# Patient Record
Sex: Male | Born: 1955 | Race: Black or African American | Hispanic: No | Marital: Married | Smoking: Current every day smoker
Health system: Southern US, Community
[De-identification: ages and names within clinical notes are randomized; demographics above are authoritative.]

## PROBLEM LIST (undated history)

## (undated) DIAGNOSIS — E7800 Pure hypercholesterolemia, unspecified: Secondary | ICD-10-CM

## (undated) DIAGNOSIS — Z720 Tobacco use: Secondary | ICD-10-CM

## (undated) DIAGNOSIS — K449 Diaphragmatic hernia without obstruction or gangrene: Secondary | ICD-10-CM

## (undated) DIAGNOSIS — J439 Emphysema, unspecified: Secondary | ICD-10-CM

## (undated) DIAGNOSIS — D869 Sarcoidosis, unspecified: Secondary | ICD-10-CM

## (undated) DIAGNOSIS — E119 Type 2 diabetes mellitus without complications: Secondary | ICD-10-CM

## (undated) DIAGNOSIS — R079 Chest pain, unspecified: Secondary | ICD-10-CM

## (undated) DIAGNOSIS — K635 Polyp of colon: Secondary | ICD-10-CM

## (undated) DIAGNOSIS — IMO0002 Reserved for concepts with insufficient information to code with codable children: Secondary | ICD-10-CM

## (undated) DIAGNOSIS — I10 Essential (primary) hypertension: Secondary | ICD-10-CM

## (undated) DIAGNOSIS — B1920 Unspecified viral hepatitis C without hepatic coma: Secondary | ICD-10-CM

## (undated) DIAGNOSIS — F1290 Cannabis use, unspecified, uncomplicated: Secondary | ICD-10-CM

## (undated) DIAGNOSIS — M1990 Unspecified osteoarthritis, unspecified site: Secondary | ICD-10-CM

## (undated) DIAGNOSIS — D696 Thrombocytopenia, unspecified: Secondary | ICD-10-CM

## (undated) HISTORY — DX: Essential (primary) hypertension: I10

## (undated) HISTORY — PX: APPENDECTOMY: SHX54

## (undated) HISTORY — DX: Polyp of colon: K63.5

## (undated) HISTORY — DX: Emphysema, unspecified: J43.9

## (undated) HISTORY — DX: Unspecified viral hepatitis C without hepatic coma: B19.20

## (undated) HISTORY — DX: Unspecified osteoarthritis, unspecified site: M19.90

## (undated) HISTORY — DX: Thrombocytopenia, unspecified: D69.6

## (undated) HISTORY — DX: Sarcoidosis, unspecified: D86.9

## (undated) HISTORY — DX: Reserved for concepts with insufficient information to code with codable children: IMO0002

---
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
## 1998-04-10 ENCOUNTER — Ambulatory Visit (HOSPITAL_COMMUNITY): Admission: RE | Admit: 1998-04-10 | Discharge: 1998-04-10 | Payer: Self-pay

## 1998-04-30 ENCOUNTER — Encounter: Admission: RE | Admit: 1998-04-30 | Discharge: 1998-04-30 | Payer: Self-pay | Admitting: *Deleted

## 1998-09-02 ENCOUNTER — Encounter: Payer: Self-pay | Admitting: General Surgery

## 1998-09-02 ENCOUNTER — Encounter: Payer: Self-pay | Admitting: Emergency Medicine

## 1998-09-02 ENCOUNTER — Inpatient Hospital Stay (HOSPITAL_COMMUNITY): Admission: EM | Admit: 1998-09-02 | Discharge: 1998-09-04 | Payer: Self-pay | Admitting: Emergency Medicine

## 1998-12-12 ENCOUNTER — Ambulatory Visit (HOSPITAL_COMMUNITY): Admission: RE | Admit: 1998-12-12 | Discharge: 1998-12-12 | Payer: Self-pay | Admitting: Family Medicine

## 1998-12-12 ENCOUNTER — Encounter: Payer: Self-pay | Admitting: Family Medicine

## 2000-01-19 ENCOUNTER — Encounter (INDEPENDENT_AMBULATORY_CARE_PROVIDER_SITE_OTHER): Payer: Self-pay | Admitting: Specialist

## 2000-01-19 ENCOUNTER — Encounter: Payer: Self-pay | Admitting: Gastroenterology

## 2000-01-19 ENCOUNTER — Ambulatory Visit (HOSPITAL_COMMUNITY): Admission: RE | Admit: 2000-01-19 | Discharge: 2000-01-19 | Payer: Self-pay | Admitting: Gastroenterology

## 2004-01-07 ENCOUNTER — Ambulatory Visit (HOSPITAL_COMMUNITY): Admission: RE | Admit: 2004-01-07 | Discharge: 2004-01-07 | Payer: Self-pay | Admitting: Internal Medicine

## 2004-04-02 ENCOUNTER — Ambulatory Visit: Payer: Self-pay | Admitting: Gastroenterology

## 2005-01-29 ENCOUNTER — Ambulatory Visit: Payer: Self-pay | Admitting: Gastroenterology

## 2005-02-24 ENCOUNTER — Ambulatory Visit (HOSPITAL_COMMUNITY): Admission: RE | Admit: 2005-02-24 | Discharge: 2005-02-24 | Payer: Self-pay | Admitting: Gastroenterology

## 2005-02-24 ENCOUNTER — Encounter (INDEPENDENT_AMBULATORY_CARE_PROVIDER_SITE_OTHER): Payer: Self-pay | Admitting: Specialist

## 2007-06-09 ENCOUNTER — Ambulatory Visit: Payer: Self-pay | Admitting: Pulmonary Disease

## 2007-06-09 DIAGNOSIS — R0602 Shortness of breath: Secondary | ICD-10-CM | POA: Insufficient documentation

## 2007-06-09 DIAGNOSIS — D869 Sarcoidosis, unspecified: Secondary | ICD-10-CM

## 2007-06-09 DIAGNOSIS — R05 Cough: Secondary | ICD-10-CM | POA: Insufficient documentation

## 2007-06-09 DIAGNOSIS — E1169 Type 2 diabetes mellitus with other specified complication: Secondary | ICD-10-CM | POA: Insufficient documentation

## 2007-06-09 DIAGNOSIS — E785 Hyperlipidemia, unspecified: Secondary | ICD-10-CM

## 2007-06-14 ENCOUNTER — Ambulatory Visit: Payer: Self-pay | Admitting: Pulmonary Disease

## 2007-06-27 ENCOUNTER — Ambulatory Visit: Payer: Self-pay | Admitting: Pulmonary Disease

## 2007-08-28 ENCOUNTER — Ambulatory Visit (HOSPITAL_COMMUNITY): Admission: RE | Admit: 2007-08-28 | Discharge: 2007-08-28 | Payer: Self-pay | Admitting: Internal Medicine

## 2007-11-09 ENCOUNTER — Ambulatory Visit: Payer: Self-pay | Admitting: Pulmonary Disease

## 2008-03-29 LAB — HM COLONOSCOPY

## 2008-06-26 ENCOUNTER — Ambulatory Visit (HOSPITAL_COMMUNITY): Admission: RE | Admit: 2008-06-26 | Discharge: 2008-06-26 | Payer: Self-pay | Admitting: Internal Medicine

## 2008-09-09 ENCOUNTER — Ambulatory Visit (HOSPITAL_COMMUNITY): Admission: RE | Admit: 2008-09-09 | Discharge: 2008-09-09 | Payer: Self-pay | Admitting: Internal Medicine

## 2008-09-26 ENCOUNTER — Ambulatory Visit: Payer: Self-pay | Admitting: Gastroenterology

## 2008-10-11 ENCOUNTER — Encounter: Payer: Self-pay | Admitting: Gastroenterology

## 2008-10-11 ENCOUNTER — Ambulatory Visit: Payer: Self-pay | Admitting: Gastroenterology

## 2008-10-15 ENCOUNTER — Encounter: Payer: Self-pay | Admitting: Gastroenterology

## 2009-07-25 ENCOUNTER — Emergency Department (HOSPITAL_COMMUNITY): Admission: EM | Admit: 2009-07-25 | Discharge: 2009-07-25 | Payer: Self-pay | Admitting: Emergency Medicine

## 2009-07-25 ENCOUNTER — Ambulatory Visit: Payer: Self-pay | Admitting: Cardiology

## 2009-08-01 ENCOUNTER — Encounter: Payer: Self-pay | Admitting: Cardiology

## 2009-08-01 DIAGNOSIS — E663 Overweight: Secondary | ICD-10-CM

## 2009-08-04 ENCOUNTER — Encounter: Payer: Self-pay | Admitting: Cardiology

## 2009-08-04 ENCOUNTER — Ambulatory Visit: Payer: Self-pay

## 2009-08-04 ENCOUNTER — Ambulatory Visit (HOSPITAL_COMMUNITY): Admission: RE | Admit: 2009-08-04 | Discharge: 2009-08-04 | Payer: Self-pay | Admitting: Cardiology

## 2009-08-04 ENCOUNTER — Ambulatory Visit: Payer: Self-pay | Admitting: Cardiology

## 2010-03-13 ENCOUNTER — Encounter
Admission: RE | Admit: 2010-03-13 | Discharge: 2010-03-13 | Payer: Self-pay | Source: Home / Self Care | Attending: Internal Medicine | Admitting: Internal Medicine

## 2010-04-30 NOTE — Assessment & Plan Note (Signed)
Summary: EPH/JML   Visit Type:  post emergency room visi Primary Provider:  Marisue Brooklyn  CC:  shoulder pain.  History of Present Illness: The patient had been seen in the emergency room for the evaluation of left chest and left shoulder and neck pain.  This was done on July 25, 2009.  There was no evidence of an acute coronary syndrome and plans were made for the patient to have completion of his workup as an outpatient.  Today he underwent a stress echo.  I have reviewed the treadmill and the images myself and they are normal.  Patient continues to have some mild discomfort in his left shoulder and neck.  This continues to be intermittent.  It is not exertional.  Current Medications (verified): 1)  Zantac 150 Mg  Caps (Ranitidine Hcl) .... Take 1 Tablet By Mouth Once A Day As Needed 2)  Fenofibrate Micronized 134 Mg Caps (Fenofibrate Micronized) .... Once Daily  Allergies (verified): No Known Drug Allergies  Past History:  Past Medical History: OVERWEIGHT/OBESITY (ICD-278.02) CHEST PAIN  ER consultation... no MI... discharged home for outpatient followup   /   stress echo  Aug 04, 2009 normal DYSPNEA (ICD-786.05) COUGH (ICD-786.2) HYPERLIPIDEMIA (ICD-272.4) SARCOIDOSIS (ICD-135) HIATAL HERNIA (ICD-553.3) hepatitis C    Review of Systems       Patient denies fever, chills, headache, sweats, rash, change in vision, change in hearing, chest pain, cough, nausea vomiting, urinary symptoms.  All of the systems are reviewed and are negative.  Vital Signs:  Patient profile:   55 year old male Height:      60 inches Weight:      227 pounds BMI:     44.49 Pulse rate:   86 / minute BP sitting:   112 / 72  (left arm) Cuff size:   regular  Vitals Entered By: Hardin Negus, RMA (Aug 04, 2009 11:44 AM)  Physical Exam  General:  patient is stable. Eyes:  no xanthelasma. Neck:  no jugulovenous stent. Lungs:  lungs are clear her respiratory effort is nonlabored. Heart:   cardiac exam reveals S1 and S2.  No clicks or significant murmurs. Abdomen:  abdomen soft. Extremities:  no peripheral edema. Psych:  patient is oriented to person time and place.  Affect is normal.   Impression & Recommendations:  Problem # 1:  OVERWEIGHT/OBESITY (ICD-278.02) Patient needs to lose weight.  Problem # 2:  CHEST PAIN-UNSPECIFIED (ICD-786.50)  Orders: EKG w/ Interpretation (93000) Standard EKG is done today and reviewed by me.  It is normal.  I've also review the stress echo.  There is normal LV function and no sign of scar or ischemia.  No further workup is needed.  Problem # 3:  DYSPNEA (ICD-786.05) Patient is not having any shortness of breath.  No further workup

## 2010-04-30 NOTE — Miscellaneous (Signed)
  Clinical Lists Changes  Observations: Added new observation of PAST MED HX: OVERWEIGHT/OBESITY (ICD-278.02) CHEST PAIN  emergency room consultation done... July 25, 2009... no enzyme elevation... patient stable to go home for outpatient followup DYSPNEA (ICD-786.05) COUGH (ICD-786.2) HYPERLIPIDEMIA (ICD-272.4) SARCOIDOSIS (ICD-135) HIATAL HERNIA (ICD-553.3) hepatitis C   (08/01/2009 12:54) Added new observation of PRIMARY MD: Marisue Brooklyn (08/01/2009 12:54)       Past History:  Past Medical History: OVERWEIGHT/OBESITY (ICD-278.02) CHEST PAIN  emergency room consultation done... July 25, 2009... no enzyme elevation... patient stable to go home for outpatient followup DYSPNEA (ICD-786.05) COUGH (ICD-786.2) HYPERLIPIDEMIA (ICD-272.4) SARCOIDOSIS (ICD-135) HIATAL HERNIA (ICD-553.3) hepatitis C

## 2010-06-16 LAB — POCT CARDIAC MARKERS
CKMB, poc: 2.1 ng/mL (ref 1.0–8.0)
Myoglobin, poc: 111 ng/mL (ref 12–200)
Troponin i, poc: <0.05 ng/mL (ref 0.00–0.09)

## 2010-06-16 LAB — CBC
HCT: 44.3 % (ref 39.0–52.0)
Hemoglobin: 15.4 g/dL (ref 13.0–17.0)
MCHC: 34.6 g/dL (ref 30.0–36.0)
MCV: 90.4 fL (ref 78.0–100.0)
Platelets: 240 10*3/uL (ref 150–400)
RBC: 4.91 MIL/uL (ref 4.22–5.81)
RDW: 13.3 % (ref 11.5–15.5)
WBC: 7.9 10*3/uL (ref 4.0–10.5)

## 2010-06-16 LAB — BASIC METABOLIC PANEL WITH GFR
BUN: 12 mg/dL (ref 6–23)
CO2: 23 meq/L (ref 19–32)
Chloride: 111 meq/L (ref 96–112)
Creatinine, Ser: 0.94 mg/dL (ref 0.4–1.5)
GFR calc Af Amer: >60 mL/min (ref >60)
Potassium: 3.9 meq/L (ref 3.5–5.1)

## 2010-06-16 LAB — DIFFERENTIAL
Basophils Absolute: 0 K/uL (ref 0.0–0.1)
Basophils Relative: 1 % (ref 0–1)
Eosinophils Absolute: 0.2 K/uL (ref 0.0–0.7)
Eosinophils Relative: 2 % (ref 0–5)
Lymphocytes Relative: 18 % (ref 12–46)
Lymphs Abs: 1.4 10*3/uL (ref 0.7–4.0)
Monocytes Absolute: 0.6 K/uL (ref 0.1–1.0)
Monocytes Relative: 8 % (ref 3–12)
Neutro Abs: 5.6 K/uL (ref 1.7–7.7)
Neutrophils Relative %: 71 % (ref 43–77)

## 2010-06-16 LAB — BASIC METABOLIC PANEL
Calcium: 9.1 mg/dL (ref 8.4–10.5)
GFR calc non Af Amer: >60 mL/min (ref >60)
Glucose, Bld: 94 mg/dL (ref 70–99)
Sodium: 140 mEq/L (ref 135–145)

## 2010-08-06 ENCOUNTER — Other Ambulatory Visit: Payer: Self-pay | Admitting: Internal Medicine

## 2010-08-06 DIAGNOSIS — R140 Abdominal distension (gaseous): Secondary | ICD-10-CM

## 2010-08-12 ENCOUNTER — Ambulatory Visit
Admission: RE | Admit: 2010-08-12 | Discharge: 2010-08-12 | Disposition: A | Discharge status: Home / Self Care | Payer: 59 | Source: Ambulatory Visit | Attending: Internal Medicine | Admitting: Internal Medicine

## 2010-08-12 DIAGNOSIS — R140 Abdominal distension (gaseous): Secondary | ICD-10-CM

## 2010-08-12 MED ORDER — IOHEXOL 300 MG/ML  SOLN
125.0000 mL | Freq: Once | INTRAMUSCULAR | Status: AC | PRN
  Administered 2010-08-12: 125 mL via INTRAVENOUS

## 2011-10-04 ENCOUNTER — Other Ambulatory Visit (HOSPITAL_COMMUNITY): Payer: Self-pay | Admitting: Internal Medicine

## 2011-10-04 ENCOUNTER — Ambulatory Visit (HOSPITAL_COMMUNITY)
Admission: RE | Admit: 2011-10-04 | Discharge: 2011-10-04 | Disposition: A | Discharge status: Home / Self Care | Payer: 59 | Source: Ambulatory Visit | Attending: Internal Medicine | Admitting: Internal Medicine

## 2011-10-04 DIAGNOSIS — F17200 Nicotine dependence, unspecified, uncomplicated: Secondary | ICD-10-CM | POA: Insufficient documentation

## 2011-10-04 DIAGNOSIS — M25559 Pain in unspecified hip: Secondary | ICD-10-CM | POA: Insufficient documentation

## 2011-10-04 DIAGNOSIS — M545 Low back pain, unspecified: Secondary | ICD-10-CM | POA: Insufficient documentation

## 2011-10-04 DIAGNOSIS — M549 Dorsalgia, unspecified: Secondary | ICD-10-CM

## 2011-10-04 DIAGNOSIS — D869 Sarcoidosis, unspecified: Secondary | ICD-10-CM

## 2011-10-04 DIAGNOSIS — M47817 Spondylosis without myelopathy or radiculopathy, lumbosacral region: Secondary | ICD-10-CM | POA: Insufficient documentation

## 2011-10-04 DIAGNOSIS — R079 Chest pain, unspecified: Secondary | ICD-10-CM | POA: Insufficient documentation

## 2011-11-15 ENCOUNTER — Ambulatory Visit (INDEPENDENT_AMBULATORY_CARE_PROVIDER_SITE_OTHER): Payer: Managed Care, Other (non HMO) | Admitting: Pulmonary Disease

## 2011-11-15 ENCOUNTER — Encounter: Payer: Self-pay | Admitting: Pulmonary Disease

## 2011-11-15 VITALS — BP 122/80 | HR 89 | Temp 98.2°F | Ht 72.0 in | Wt 228.2 lb

## 2011-11-15 DIAGNOSIS — D869 Sarcoidosis, unspecified: Secondary | ICD-10-CM

## 2011-11-15 NOTE — Assessment & Plan Note (Signed)
The patient has a history of sarcoidosis, but has not had pulmonary followup since 2009.  He is not having any worsening symptoms, and his chest x-ray is stable.  He has had a recent eye exam that was unremarkable.  At this point, I would like to do followup pulmonary function studies, and compare to his prior values.  If these are stable, he does not require ongoing pulmonary followup, but would benefit from a yearly chest x-ray as part of his complete physical.  If there has been worsening of his breathing studies, we may need to follow him serially.  I have also encouraged him to work aggressively on total smoking cessation.

## 2011-11-15 NOTE — Progress Notes (Signed)
  Subjective:    Patient ID: Jerry Stewart, male    DOB: Aug 20, 1955, 56 y.o.   MRN: [id number]  HPI The patient is a 56 year old male who I've been asked to see for management of sarcoidosis.  He has been seen in the past, but not since 2009.  He was diagnosed with sarcoid by mediastinoscopy in 1998, and his last evaluation with me in 2009 showed only mild chest x-ray changes and normal PFTs except for a mild decrease in diffusion capacity.  The patient has not followed up since that time.  He has chronic dyspnea on exertion with moderate to heavy activities, but feels that it is unchanged over the years.  He denies any significant cough or mucus production, and does not have any significant joint complaints.  He has had no skin changes, and has had a recent eye exam.  He had a chest x-ray last month that shows minimal lymphadenopathy, as well as a mild interstitial prominence that is unchanged from prior films.  He denies any ongoing constitutional symptoms.   Review of Systems  Constitutional: Negative for fever and unexpected weight change.  HENT: Positive for trouble swallowing. Negative for ear pain, nosebleeds, congestion, sore throat, rhinorrhea, sneezing, dental problem, postnasal drip and sinus pressure.   Eyes: Negative for redness and itching.  Respiratory: Negative for cough, chest tightness, shortness of breath and wheezing.   Cardiovascular: Negative for palpitations and leg swelling.  Gastrointestinal: Negative for nausea and vomiting.  Genitourinary: Negative for dysuria.  Musculoskeletal: Positive for back pain. Negative for joint swelling.  Skin: Negative for rash.  Neurological: Positive for numbness. Negative for headaches.  Hematological: Does not bruise/bleed easily.  Psychiatric/Behavioral: Negative for dysphoric mood. The patient is not nervous/anxious.   All other systems reviewed and are negative.       Objective:   Physical Exam Constitutional:  Well developed, no  acute distress  HENT:  Nares patent without discharge  Oropharynx without exudate, palate normal, uvula elongated.   Eyes:  Perrla, eomi, no scleral icterus  Neck:  No JVD, no TMG  Cardiovascular:  Normal rate, regular rhythm, no rubs or gallops.  No murmurs        Intact distal pulses  Pulmonary :  Normal breath sounds, no stridor or respiratory distress   No rales, rhonchi, or wheezing  Abdominal:  Soft, nondistended, bowel sounds present.  No tenderness noted.   Musculoskeletal:  No lower extremity edema noted.  Lymph Nodes:  No cervical lymphadenopathy noted  Skin:  No cyanosis noted.  +pustules on shins (pt states this is poison ivy).  Neurologic:  Alert, appropriate, moves all 4 extremities without obvious deficit.         Assessment & Plan:

## 2011-11-15 NOTE — Patient Instructions (Addendum)
Will schedule for breathing studies, and call you with the results. Work on quitting smoking. Will arrange followup once I see your breathing tests.

## 2011-11-22 ENCOUNTER — Ambulatory Visit (INDEPENDENT_AMBULATORY_CARE_PROVIDER_SITE_OTHER): Payer: Managed Care, Other (non HMO) | Admitting: Pulmonary Disease

## 2011-11-22 DIAGNOSIS — D869 Sarcoidosis, unspecified: Secondary | ICD-10-CM

## 2011-11-22 LAB — PULMONARY FUNCTION TEST

## 2011-11-22 NOTE — Progress Notes (Signed)
PFT done today. 

## 2011-11-23 ENCOUNTER — Telehealth: Payer: Self-pay | Admitting: Pulmonary Disease

## 2011-11-23 NOTE — Telephone Encounter (Signed)
Pt wife aware of results.Jerry Stewart, CMA

## 2011-11-23 NOTE — Telephone Encounter (Signed)
Please let pt know his breathing tests are stable compared to 2009, and needs no further followup unless having increaed breathing issues.  He does need a yearly cxr with his primary as part of his physical.

## 2011-12-10 ENCOUNTER — Encounter: Payer: Self-pay | Admitting: Pulmonary Disease

## 2012-05-16 ENCOUNTER — Observation Stay (HOSPITAL_COMMUNITY)
Admission: EM | Admit: 2012-05-16 | Discharge: 2012-05-17 | Disposition: A | Discharge status: Home / Self Care | Payer: Managed Care, Other (non HMO) | Attending: Internal Medicine | Admitting: Internal Medicine

## 2012-05-16 ENCOUNTER — Encounter (HOSPITAL_COMMUNITY): Payer: Self-pay | Admitting: *Deleted

## 2012-05-16 ENCOUNTER — Emergency Department (HOSPITAL_COMMUNITY): Payer: Managed Care, Other (non HMO)

## 2012-05-16 DIAGNOSIS — E785 Hyperlipidemia, unspecified: Secondary | ICD-10-CM | POA: Diagnosis present

## 2012-05-16 DIAGNOSIS — F17200 Nicotine dependence, unspecified, uncomplicated: Secondary | ICD-10-CM | POA: Insufficient documentation

## 2012-05-16 DIAGNOSIS — E663 Overweight: Secondary | ICD-10-CM

## 2012-05-16 DIAGNOSIS — E119 Type 2 diabetes mellitus without complications: Secondary | ICD-10-CM | POA: Insufficient documentation

## 2012-05-16 DIAGNOSIS — R072 Precordial pain: Secondary | ICD-10-CM

## 2012-05-16 DIAGNOSIS — D869 Sarcoidosis, unspecified: Secondary | ICD-10-CM | POA: Diagnosis present

## 2012-05-16 DIAGNOSIS — R079 Chest pain, unspecified: Principal | ICD-10-CM | POA: Insufficient documentation

## 2012-05-16 DIAGNOSIS — E1169 Type 2 diabetes mellitus with other specified complication: Secondary | ICD-10-CM | POA: Diagnosis present

## 2012-05-16 DIAGNOSIS — K449 Diaphragmatic hernia without obstruction or gangrene: Secondary | ICD-10-CM | POA: Insufficient documentation

## 2012-05-16 DIAGNOSIS — R0789 Other chest pain: Secondary | ICD-10-CM

## 2012-05-16 DIAGNOSIS — E669 Obesity, unspecified: Secondary | ICD-10-CM | POA: Insufficient documentation

## 2012-05-16 HISTORY — DX: Type 2 diabetes mellitus without complications: E11.9

## 2012-05-16 HISTORY — DX: Chest pain, unspecified: R07.9

## 2012-05-16 HISTORY — DX: Cannabis use, unspecified, uncomplicated: F12.90

## 2012-05-16 HISTORY — DX: Tobacco use: Z72.0

## 2012-05-16 HISTORY — DX: Diaphragmatic hernia without obstruction or gangrene: K44.9

## 2012-05-16 HISTORY — DX: Pure hypercholesterolemia, unspecified: E78.00

## 2012-05-16 LAB — CBC
HCT: 45.8 % (ref 39.0–52.0)
HCT: 45.7 % (ref 39.0–52.0)
Hemoglobin: 15.7 g/dL (ref 13.0–17.0)
Hemoglobin: 16.1 g/dL (ref 13.0–17.0)
MCHC: 34.3 g/dL (ref 30.0–36.0)
MCHC: 35.2 g/dL (ref 30.0–36.0)
RBC: 5.32 MIL/uL (ref 4.22–5.81)
RDW: 13.2 % (ref 11.5–15.5)
WBC: 5.8 10*3/uL (ref 4.0–10.5)

## 2012-05-16 LAB — TROPONIN I
Troponin I: <0.3 ng/mL (ref <0.30)
Troponin I: <0.3 ng/mL (ref <0.30)

## 2012-05-16 LAB — POCT I-STAT TROPONIN I: Troponin i, poc: 0.01 ng/mL (ref 0.00–0.08)

## 2012-05-16 LAB — CK TOTAL AND CKMB (NOT AT ARMC): Total CK: 209 U/L (ref 7–232)

## 2012-05-16 LAB — POCT I-STAT, CHEM 8
Chloride: 109 mEq/L (ref 96–112)
HCT: 48 % (ref 39.0–52.0)
Potassium: 3.9 mEq/L (ref 3.5–5.1)

## 2012-05-16 LAB — LIPID PANEL
LDL Cholesterol: UNDETERMINED mg/dL (ref 0–99)
Triglycerides: 437 mg/dL — ABNORMAL HIGH (ref <150)

## 2012-05-16 LAB — CREATININE, SERUM
Creatinine, Ser: 0.8 mg/dL (ref 0.50–1.35)
GFR calc Af Amer: >90 mL/min (ref >90)
GFR calc non Af Amer: >90 mL/min (ref >90)

## 2012-05-16 LAB — GLUCOSE, CAPILLARY: Glucose-Capillary: 138 mg/dL — ABNORMAL HIGH (ref 70–99)

## 2012-05-16 MED ORDER — NITROGLYCERIN 0.4 MG SL SUBL
0.4000 mg | SUBLINGUAL_TABLET | SUBLINGUAL | Status: DC | PRN
  Administered 2012-05-16 (×2): 0.4 mg via SUBLINGUAL
  Filled 2012-05-16: qty 25

## 2012-05-16 MED ORDER — INSULIN ASPART 100 UNIT/ML ~~LOC~~ SOLN: 0.0000 [IU] | Freq: Every day | SUBCUTANEOUS | Status: DC

## 2012-05-16 MED ORDER — ALUM & MAG HYDROXIDE-SIMETH 200-200-20 MG/5ML PO SUSP: 30.0000 mL | Freq: Four times a day (QID) | ORAL | Status: DC | PRN

## 2012-05-16 MED ORDER — ALBUTEROL SULFATE (5 MG/ML) 0.5% IN NEBU: 2.5000 mg | INHALATION_SOLUTION | RESPIRATORY_TRACT | Status: DC | PRN

## 2012-05-16 MED ORDER — NICOTINE 21 MG/24HR TD PT24
21.0000 mg | MEDICATED_PATCH | Freq: Every day | TRANSDERMAL | Status: DC
  Administered 2012-05-16 – 2012-05-17 (×2): 21 mg via TRANSDERMAL
  Filled 2012-05-16 (×2): qty 1

## 2012-05-16 MED ORDER — ACETAMINOPHEN 325 MG PO TABS: 650.0000 mg | ORAL_TABLET | Freq: Four times a day (QID) | ORAL | Status: DC | PRN

## 2012-05-16 MED ORDER — ONDANSETRON HCL 4 MG/2ML IJ SOLN: 4.0000 mg | Freq: Four times a day (QID) | INTRAMUSCULAR | Status: DC | PRN

## 2012-05-16 MED ORDER — SODIUM CHLORIDE 0.9 % IJ SOLN: 3.0000 mL | Freq: Two times a day (BID) | INTRAMUSCULAR | Status: DC

## 2012-05-16 MED ORDER — ASPIRIN EC 325 MG PO TBEC: 325.0000 mg | DELAYED_RELEASE_TABLET | Freq: Every day | ORAL | Status: DC

## 2012-05-16 MED ORDER — HYDROCODONE-ACETAMINOPHEN 5-325 MG PO TABS: 1.0000 | ORAL_TABLET | ORAL | Status: DC | PRN

## 2012-05-16 MED ORDER — FENOFIBRATE 160 MG PO TABS
160.0000 mg | ORAL_TABLET | Freq: Every day | ORAL | Status: DC
  Administered 2012-05-16 – 2012-05-17 (×2): 160 mg via ORAL
  Filled 2012-05-16 (×2): qty 1

## 2012-05-16 MED ORDER — HEPARIN SODIUM (PORCINE) 5000 UNIT/ML IJ SOLN
5000.0000 [IU] | Freq: Three times a day (TID) | INTRAMUSCULAR | Status: DC
  Administered 2012-05-16 (×2): 5000 [IU] via SUBCUTANEOUS
  Filled 2012-05-16 (×6): qty 1

## 2012-05-16 MED ORDER — HYDROMORPHONE HCL PF 1 MG/ML IJ SOLN: 1.0000 mg | INTRAMUSCULAR | Status: DC | PRN

## 2012-05-16 MED ORDER — ONDANSETRON HCL 4 MG PO TABS: 4.0000 mg | ORAL_TABLET | Freq: Four times a day (QID) | ORAL | Status: DC | PRN

## 2012-05-16 MED ORDER — ACETAMINOPHEN 650 MG RE SUPP: 650.0000 mg | Freq: Four times a day (QID) | RECTAL | Status: DC | PRN

## 2012-05-16 MED ORDER — ASPIRIN 81 MG PO CHEW
324.0000 mg | CHEWABLE_TABLET | Freq: Once | ORAL | Status: AC
  Administered 2012-05-16: 324 mg via ORAL
  Filled 2012-05-16: qty 4

## 2012-05-16 MED ORDER — NITROGLYCERIN 2 % TD OINT
1.0000 [in_us] | TOPICAL_OINTMENT | Freq: Four times a day (QID) | TRANSDERMAL | Status: DC
  Administered 2012-05-16 – 2012-05-17 (×4): 1 [in_us] via TOPICAL
  Filled 2012-05-16: qty 30
  Filled 2012-05-16: qty 1

## 2012-05-16 MED ORDER — PNEUMOCOCCAL VAC POLYVALENT 25 MCG/0.5ML IJ INJ
0.5000 mL | INJECTION | INTRAMUSCULAR | Status: DC
  Filled 2012-05-16: qty 0.5

## 2012-05-16 MED ORDER — PANTOPRAZOLE SODIUM 40 MG PO TBEC
40.0000 mg | DELAYED_RELEASE_TABLET | Freq: Every day | ORAL | Status: DC
  Administered 2012-05-17: 40 mg via ORAL
  Filled 2012-05-16: qty 1

## 2012-05-16 MED ORDER — INSULIN ASPART 100 UNIT/ML ~~LOC~~ SOLN: 0.0000 [IU] | Freq: Three times a day (TID) | SUBCUTANEOUS | Status: DC

## 2012-05-16 MED ORDER — SODIUM CHLORIDE 0.9 % IV SOLN
INTRAVENOUS | Status: DC
  Administered 2012-05-16: 17:00:00 via INTRAVENOUS

## 2012-05-16 NOTE — ED Provider Notes (Signed)
History     CSN: [id number]  Arrival date & time 05/16/12  1149   First MD Initiated Contact with Patient 05/16/12 1207      Chief Complaint  Patient presents with  . Chest Pain  . Arm Pain    HPI Jerry Stewart is a 57 y.o. male who presents to the ED with substernal pressure sensation.  Reports that this has been intermittent over last week.  Radiates to L arm.  Started while lifting heavy boards and planks at work.  Also experienced this last night.  Not necessarily exertional.  No SOB.  No leg swelling.  No history of DVT/PE.  No other symptoms.  Past Medical History  Diagnosis Date  . Sarcoid   . Hepatitis C   . Diabetes mellitus without complication   . Hypercholesteremia     Past Surgical History  Procedure Laterality Date  . Appendectomy      Family History  Problem Relation Age of Onset  . Leukemia Mother     History  Substance Use Topics  . Smoking status: Current Every Day Smoker -- 0.50 packs/day for 30 years    Types: Cigarettes  . Smokeless tobacco: Not on file  . Alcohol Use: 6.0 oz/week    10 Cans of beer per week      Review of Systems  Constitutional: Negative for fever and chills.  HENT: Negative for congestion, sore throat and neck pain.   Respiratory: Negative for cough.   Cardiovascular: Positive for chest pain.  Gastrointestinal: Negative for nausea, vomiting, abdominal pain, diarrhea and constipation.  Endocrine: Negative for polyuria.  Genitourinary: Negative for dysuria and hematuria.  Skin: Negative for rash.  Neurological: Negative for headaches.  Psychiatric/Behavioral: Negative.   All other systems reviewed and are negative.    Allergies  Review of patient's allergies indicates no known allergies.  Home Medications   Current Outpatient Rx  Name  Route  Sig  Dispense  Refill  . fenofibrate micronized (LOFIBRA) 134 MG capsule   Oral   Take 134 mg by mouth daily before breakfast.           BP 126/85  Pulse 84   Temp(Src) 97.4 F (36.3 C) (Oral)  Resp 18  Ht 6' (1.829 m)  Wt 236 lb (107.049 kg)  BMI 32 kg/m2  SpO2 96%  Physical Exam  Nursing note and vitals reviewed. Constitutional: He is oriented to person, place, and time. He appears well-developed and well-nourished. No distress.  HENT:  Head: Normocephalic and atraumatic.  Right Ear: External ear normal.  Left Ear: External ear normal.  Mouth/Throat: Oropharynx is clear and moist. No oropharyngeal exudate.  Eyes: Conjunctivae are normal. Pupils are equal, round, and reactive to light. Right eye exhibits no discharge.  Neck: Normal range of motion. Neck supple. No tracheal deviation present.  Cardiovascular: Normal rate, regular rhythm and intact distal pulses.   Pulmonary/Chest: Effort normal. No respiratory distress. He has no wheezes. He has no rales.  Abdominal: Soft. He exhibits no distension. There is no tenderness. There is no rebound and no guarding.  Musculoskeletal: Normal range of motion.  Neurological: He is alert and oriented to person, place, and time.  Skin: Skin is warm and dry. No rash noted. He is not diaphoretic.  Psychiatric: He has a normal mood and affect.    ED Course  Procedures (including critical care time)  Labs Reviewed  CBC  POCT I-STAT, CHEM 8   No results found.   No diagnosis  found.   Date: 05/16/2012  Rate: 82  Rhythm: normal sinus rhythm  QRS Axis: normal  Intervals: normal  ST/T Wave abnormalities: nonspecific ST/T changes  Conduction Disutrbances:none  Narrative Interpretation: T wave inversions in leads 3 and AVF  Old EKG Reviewed: Similar.    MDM   Jerry Stewart is a 57 y.o. male who presents to the ED with SSCP.  NTG given and patient chest pain free after treatment.  Semiconvincing story for angina.  No evidence of PE.  Patient chest pain free.  Initial trope negative.  CXR negative.  Medicine consulted for CP r/o.  No evidence of dissection or other life threatening cause of CP.   Patient stable for floor and CP free upon admission.  Patient admitted.       Arloa Koh, MD 05/16/12 [phone]

## 2012-05-16 NOTE — Progress Notes (Signed)
Utilization review completed.  

## 2012-05-16 NOTE — ED Notes (Signed)
Pt sent here from his pcp for ekg changes since July.  He has been experiencing L chest pain that radiates to his L arm x 1 week that is getting worse.  Pt is not sure if he could have injured himself while lifting planks, however, he c/o intermittent sternal crushing pain while working.  Denies increased sob.

## 2012-05-16 NOTE — ED Notes (Signed)
I gave the patient a warm blanket. 

## 2012-05-16 NOTE — Consult Note (Signed)
Consult Note  Patient ID: Jerry Stewart MRN: [id number], SOB: 02/24/1956 57 y.o. Date of Encounter: 05/16/2012, 3:09 PM  Primary Physician: Nadean Corwin, MD Primary Cardiologist: Dr. Myrtis Ser  Chief Complaint: chest pain  HPI: 57 y.o. male w/ PMHx significant for normal stress echo 2011, HLD, Tobacco abuse, DMII, Obesity, THC abuse, Hiatal Hernia, Sarcoidosis, and Hep C who presented to [facility] on 05/16/2012 with complaints of chest pain.  Mr. Beever was evaluated by Dr. Myrtis Ser in 2011 for chest pain. He ruled out for MI in the hospital and had an outpatient stress echo that was without evidence of ischemia or infarct. No family history of CAD. Continues to smoke tobacco and marijuana. Occasional ETOH use. He reports sharp pain in his left shoulder/arm that has been intermittent daily for 9 days. This morning around 2am he was sitting down at work and felt sudden onset grabbing pain in his left-sided chest. No nausea, sob, diaphoresis, or dizziness. He continued to work without a change in his pain. Not worsened with inspiration or food intake. Went to his PCP today around 10am who recommended he be evaluated in the ED. The pain stayed constant for ~10hrs and was relieved with NTG in the ED. Continues to have left shoulder/arm pain. He does manual labor at work, moving heavy boards. Usually able to do heavy lifting, walking, etc without chest pain. Denies fever, chills, coughing, shortness of breath, orthopnea, PND, edema, abd pain, melena/hematochezia/hematemesis, n/v/d. No extended travel or prolonged immobilization.   In the ED, EKG demonstrates NSR with new TWI V3, otherwise unchanged from prior EKG. CXR is without acute cardiopulmonary abnormalities. Labs are significant for normal troponin, unremarkable CBC/BMET. He is chest pain free, but continues to complain of left shoulder/arm pain. Not worse with movement or palpation. He is being admitted by the medicine service and Cardiology  is asked to evaluate his chest pain.    2011 - Stress Echo - Stress ECG conclusions: There were no stress arrhythmias or conduction abnormalities. The stress ECG was negative for ischemia. - Staged echo: There was no echocardiographic evidence for stress-induced ischemia. Impressions: Stress echocardiogram with no chest pain, no diagnostic ST changes and no stress-induced wall motion abnormalities.   Past Medical History  Diagnosis Date  . Sarcoid   . Hepatitis C   . Diabetes mellitus without complication   . Hypercholesteremia   . Tobacco abuse   . Hiatal hernia   . Marijuana use      Surgical History:  Past Surgical History  Procedure Laterality Date  . Appendectomy       Home Meds: Medication Sig  fenofibrate micronized (LOFIBRA) 134 MG capsule Take 134 mg by mouth daily before breakfast.  metFORMIN (GLUCOPHAGE) 500 MG tablet Take 500 mg by mouth 2 (two) times daily with a meal.    Allergies: No Known Allergies  History   Social History  . Marital Status: Married    Spouse Name: N/A    Number of Children: N/A  . Years of Education: N/A   Occupational History  . laborer    Social History Main Topics  . Smoking status: Current Every Day Smoker -- 0.50 packs/day for 30 years    Types: Cigarettes  . Smokeless tobacco: Not on file  . Alcohol Use: 6.0 oz/week    10 Cans of beer per week     Comment: 2 cans of beer a week (18oz)  . Drug Use: Yes     Comment: Marijuana  . Sexually  Active: Not on file   Other Topics Concern  . Not on file   Social History Narrative  . No narrative on file     Family History  Problem Relation Age of Onset  . Leukemia Mother   . Diabetes Mellitus II Mother   . Other      No family h/o CAD    Review of Systems: General: negative for chills, fever, night sweats or weight changes.  Cardiovascular: As per HPI Dermatological: negative for rash Respiratory: negative for cough or wheezing Urologic: negative for  hematuria Abdominal: negative for nausea, vomiting, diarrhea, bright red blood per rectum, melena, or hematemesis Neurologic: negative for visual changes, syncope, or dizziness All other systems reviewed and are otherwise negative except as noted above.  Labs:   Component Value Date   WBC 5.8 05/16/2012   HGB 16.3 05/16/2012   HCT 48.0 05/16/2012   MCV 86.1 05/16/2012   PLT 214 05/16/2012   Lab 05/16/12 1220  NA 140  K 3.9  CL 109  BUN 9  CREATININE 0.70  GLUCOSE 113*      05/16/2012 12:18  Troponin i, poc 0.01    Radiology/Studies:   05/16/2012 -  CHEST - 2 VIEW   Findings: Interstitium is prominent in the upper lobes, a stable finding.  There is no new opacity.  There is no frank edema or consolidation.  Right paratracheal adenopathy remains.  The hila appear borderline prominent but stable bilaterally.  There is no evidence of new adenopathy.  There is degenerative change in the thoracic spine. Heart size and pulmonary vascularity appear unremarkable.  IMPRESSION: There are changes suggesting underlying sarcoidosis which are essentially stable.  The hila are perhaps marginally less prominent than on the previous study.  There is patchy interstitial disease in the upper lobes, stable, a finding that is typical of sarcoidosis.  There is no edema or consolidation.       EKG: 05/16/12 - NSR with new TWI V3, otherwise unchanged from prior EKG  Physical Exam: Blood pressure 107/70, pulse 66, temperature 97.4 F (36.3 C), temperature source Oral, resp. rate 28, height 6' (1.829 m), weight 236 lb (107.049 kg), SpO2 96.00%. General: Well developed, middle-aged black male in no acute distress. Head: Normocephalic, atraumatic, sclera non-icteric, nares are without discharge Neck: Supple. Negative for carotid bruits or JVD Lungs: Clear bilaterally to auscultation without wheezes, rales, or rhonchi. Breathing is unlabored. Heart: RRR with S1 S2. No murmurs, rubs, or gallops  appreciated. Abdomen: Soft, non-tender, non-distended with normoactive bowel sounds. No rebound/guarding. No obvious abdominal masses. Msk:  Strength and tone appear normal for age. Extremities: No edema. No clubbing or cyanosis. Distal pedal pulses are intact and equal bilaterally. Neuro: Alert and oriented X 3. Moves all extremities spontaneously. Psych:  Responds to questions appropriately with a normal affect.    ASSESSMENT AND PLAN:  57 y.o. male w/ PMHx significant for normal stress echo 2011, HLD, Tobacco abuse, DMII, Obesity, THC abuse, Hiatal Hernia, Sarcoidosis, and Hep C who presented to [facility] on 05/16/2012 with complaints of chest pain.  1. Precordial pain 2. Hyperlipidemia 3. Diabetes Mellitus, Type 2 4. Tobacco abuse 5. Obesity 6. Hiatal Hernia 7. Sarcoidosis   Signed, HOPE, JESSICA PA-C 05/16/2012, 3:09 PM     History and all data above reviewed.  Patient examined.  I agree with the findings as above.  The patient has chest pain new onset at rest.  The symptoms are suspicious for Class IV angina.  The pretest probability of obstructive CAD is moderately high.  The patient exam reveals COR:RRR  ,  Lungs: Clear  ,  Abd: Positive bowel sounds, no rebound no guarding, Ext No edema  .  All available labs, radiology testing, previous records reviewed. Agree with documented assessment and plan. Cardiac cath is indicated.  The patient understands that risks included but are not limited to stroke (1 in 1000), death (1 in 1000), kidney failure [usually temporary] (1 in 500), bleeding (1 in 200), allergic reaction [possibly serious] (1 in 200).  The patient understands and agrees to proceed.   We discussed the need to stop smoking as well.  He could not tolerate Chantix.  He will consider NTG patch.  Fayrene Fearing Nikkie Liming  4:15 PM  05/16/2012

## 2012-05-16 NOTE — H&P (Signed)
History and Physical       Hospital Admission Note Date: 05/16/2012  Patient name: Jerry Stewart Medical record number: [id number] Date of birth: 07-11-1955 Age: 57 y.o. Gender: male PCP: Nadean Corwin, MD    Chief Complaint:  Intermittent Chest pain for almost a week  HPI: Patient is a 57 year old male with history of diabetes, hyperlipidemia, active smoking presented to ED with intermittent chest pain for almost a week. History was obtained from the patient who stated that he has been having midsternal and left shoulder pain radiating down to the arm with intermittent episodes for almost a week. Patient states that the chest pain has no association with breathing or eating. He states that the episodes last from 15 minutes to half an hour, resolved spontaneously and coming at rest as well. At the time of the triage, patient's chest pain was 8/10 which improved after aspirin and 2 sublingual nitroglycerin's to 1-2/10 at the time of my examination. He denied any significant palpitations, diaphoresis or shortness of breath. Patient states he that he had a stress test to 3 years ago and was negative. He does not take aspirin at home, denies any family history of premature coronary disease.   Review of Systems:  Constitutional: Denies fever, chills, diaphoresis, appetite change and fatigue.  HEENT: Denies photophobia, eye pain, redness, hearing loss, ear pain, congestion, sore throat, rhinorrhea, sneezing, mouth sores, trouble swallowing, neck pain, neck stiffness and tinnitus.   Respiratory: Denies SOB, DOE, cough, chest tightness,  and wheezing.   Cardiovascular: Please see history of present illness  Gastrointestinal: Denies nausea, vomiting, abdominal pain, diarrhea, constipation, blood in stool and abdominal distention.  Genitourinary: Denies dysuria, urgency, frequency, hematuria, flank pain and difficulty urinating.   Musculoskeletal: Denies myalgias, back pain, joint swelling, arthralgias and gait problem.  Skin: Denies pallor, rash and wound.  Neurological: Denies dizziness, seizures, syncope, weakness, light-headedness, numbness and headaches.  Hematological: Denies adenopathy. Easy bruising, personal or family bleeding history  Psychiatric/Behavioral: Denies suicidal ideation, mood changes, confusion, nervousness, sleep disturbance and agitation  Past Medical History: Past Medical History  Diagnosis Date  . Sarcoid   . Hepatitis C   . Diabetes mellitus without complication   . Hypercholesteremia    Past Surgical History  Procedure Laterality Date  . Appendectomy      Medications: Prior to Admission medications   Medication Sig Start Date End Date Taking? Authorizing Provider  fenofibrate micronized (LOFIBRA) 134 MG capsule Take 134 mg by mouth daily before breakfast.   Yes Historical Provider, MD  metFORMIN (GLUCOPHAGE) 500 MG tablet Take 500 mg by mouth 2 (two) times daily with a meal.   Yes Historical Provider, MD    Allergies:  No Known Allergies  Social History:  reports that he has been smoking Cigarettes.  He has a 15 pack-year smoking history. He smokes half pack per day for at least 30 years He does not have any smokeless tobacco history on file. He reports that he drinks about 6.0 ounces of alcohol per week. He reports that he does not use illicit drugs.  Family History: Family History  Problem Relation Age of Onset  . Leukemia Mother     Physical Exam: Blood pressure 107/70, pulse 66, temperature 97.4 F (36.3 C), temperature source Oral, resp. rate 28, height 6' (1.829 m), weight 107.049 kg (236 lb), SpO2 96.00%. General: Alert, awake, oriented x3, in no acute distress. HEENT: normocephalic, atraumatic, anicteric sclera, pink conjunctiva, pupils equal and reactive to light and accomodation, oropharynx  clear Neck: supple, no masses or lymphadenopathy, no goiter, no bruits   Heart: Regular rate and rhythm, without murmurs, rubs or gallops. Lungs/chest: Clear to auscultation bilaterally, no wheezing, rales or rhonchi. Abdomen: Soft, nontender, nondistended, positive bowel sounds, no masses. Extremities: No clubbing, cyanosis or edema with positive pedal pulses. Neuro: Grossly intact, no focal neurological deficits, strength 5/5 upper and lower extremities bilaterally Psych: alert and oriented x 3, normal mood and affect Skin: no rashes or lesions, warm and dry   LABS on Admission:  Basic Metabolic Panel:  Recent Labs Lab 05/16/12 1220  NA 140  K 3.9  CL 109  GLUCOSE 113*  BUN 9  CREATININE 0.70    CBC:  Recent Labs Lab 05/16/12 1206 05/16/12 1220  WBC 5.8  --   HGB 15.7 16.3  HCT 45.8 48.0  MCV 86.1  --   PLT 214  --      Radiological Exams on Admission: Dg Chest 2 View  05/16/2012  *RADIOLOGY REPORT*  Clinical Data: Chest pain; history of sarcoidosis  CHEST - 2 VIEW  Comparison:  October 04, 2011  Findings: Interstitium is prominent in the upper lobes, a stable finding.  There is no new opacity.  There is no frank edema or consolidation.  Right paratracheal adenopathy remains.  The hila appear borderline prominent but stable bilaterally.  There is no evidence of new adenopathy.  There is degenerative change in the thoracic spine. Heart size and pulmonary vascularity appear unremarkable.  IMPRESSION: There are changes suggesting underlying sarcoidosis which are essentially stable.  The hila are perhaps marginally less prominent than on the previous study.  There is patchy interstitial disease in the upper lobes, stable, a finding that is typical of sarcoidosis.  There is no edema or consolidation.   Original Report Authenticated By: Bretta Bang, M.D.     Assessment/Plan Principal Problem:   Chest pain, midsternal: Concerning for cardiac cause although he does have a history of hiatal hernia. He has strong risk factors of diabetes, obesity,  hyperlipidemia, active nicotine use.  - Admit to telemetry, obtain serial cardiac enzymes x3,  EKG reviewed, T wave inversions in lead 3,avf -  place on aspirin, nitroglycerin paste, obtain lipid panel and HbA1c - at the time of my encounter, BP was 107/70, hence cannot place on beta blockers - Given his strong risk factors and possible unstable angina, cardiology consult obtained, will defer to cardiology regarding NM stress test versus cardiac catheterization   Active Problems:   Sarcoidosis: Follows Dr. Shelle Iron, pulmonology    HYPERLIPIDEMIA-  - Obtain lipid panel, continue fenofibrate    OVERWEIGHT/OBESITY: - Patient was counseled on diet and weight control     HIATAL HERNIA - Place on PPI    Diabetes mellitus - Hold metformin in patient, obtain HbA1c, place on sliding scale insulin while inpatient  DVT prophylaxis: Heparin subcutaneous  CODE STATUS: Full code  Further plan will depend as patient's clinical course evolves and further radiologic and laboratory data become available.   Time Spent on Admission: 1 hour  RAI,RIPUDEEP M.D. [facility] 05/16/2012, 2:30 PM Pager: [phone]  If 7PM-7AM, please contact night-coverage [email or web address] Password [email or web address]

## 2012-05-17 ENCOUNTER — Encounter (HOSPITAL_COMMUNITY)
Admission: EM | Disposition: A | Discharge status: Home / Self Care | Payer: Self-pay | Source: Home / Self Care | Attending: Emergency Medicine

## 2012-05-17 ENCOUNTER — Ambulatory Visit (HOSPITAL_COMMUNITY): Admit: 2012-05-17 | Payer: Self-pay | Admitting: Cardiology

## 2012-05-17 DIAGNOSIS — R079 Chest pain, unspecified: Secondary | ICD-10-CM

## 2012-05-17 HISTORY — PX: LEFT HEART CATHETERIZATION WITH CORONARY ANGIOGRAM: SHX5451

## 2012-05-17 LAB — GLUCOSE, CAPILLARY

## 2012-05-17 LAB — CBC
Hemoglobin: 14.7 g/dL (ref 13.0–17.0)
RBC: 5.04 MIL/uL (ref 4.22–5.81)
WBC: 5.7 10*3/uL (ref 4.0–10.5)

## 2012-05-17 LAB — BASIC METABOLIC PANEL
GFR calc Af Amer: >90 mL/min (ref >90)
GFR calc non Af Amer: >90 mL/min (ref >90)
Glucose, Bld: 114 mg/dL — ABNORMAL HIGH (ref 70–99)
Potassium: 3.9 mEq/L (ref 3.5–5.1)
Sodium: 139 mEq/L (ref 135–145)

## 2012-05-17 LAB — PROTIME-INR: Prothrombin Time: 13.1 seconds (ref 11.6–15.2)

## 2012-05-17 LAB — CK TOTAL AND CKMB (NOT AT ARMC)
CK, MB: 3.7 ng/mL (ref 0.3–4.0)
Total CK: 151 U/L (ref 7–232)

## 2012-05-17 LAB — HEMOGLOBIN A1C
Hgb A1c MFr Bld: 5.5 % (ref <5.7)
Mean Plasma Glucose: 111 mg/dL (ref <117)

## 2012-05-17 LAB — TROPONIN I: Troponin I: <0.3 ng/mL (ref <0.30)

## 2012-05-17 SURGERY — LEFT HEART CATHETERIZATION WITH CORONARY ANGIOGRAM
Anesthesia: LOCAL

## 2012-05-17 MED ORDER — VERAPAMIL HCL 2.5 MG/ML IV SOLN
INTRAVENOUS | Status: AC
  Filled 2012-05-17: qty 2

## 2012-05-17 MED ORDER — PANTOPRAZOLE SODIUM 40 MG PO TBEC: 40.0000 mg | DELAYED_RELEASE_TABLET | Freq: Every day | ORAL | Status: DC

## 2012-05-17 MED ORDER — SODIUM CHLORIDE 0.9 % IV SOLN
1.0000 mL/kg/h | INTRAVENOUS | Status: AC
  Administered 2012-05-17: 1 mL/kg/h via INTRAVENOUS

## 2012-05-17 MED ORDER — MIDAZOLAM HCL 2 MG/2ML IJ SOLN
INTRAMUSCULAR | Status: AC
  Filled 2012-05-17: qty 2

## 2012-05-17 MED ORDER — FENTANYL CITRATE 0.05 MG/ML IJ SOLN
INTRAMUSCULAR | Status: AC
  Filled 2012-05-17: qty 2

## 2012-05-17 MED ORDER — HEPARIN SODIUM (PORCINE) 1000 UNIT/ML IJ SOLN
INTRAMUSCULAR | Status: AC
  Filled 2012-05-17: qty 1

## 2012-05-17 MED ORDER — HEPARIN (PORCINE) IN NACL 2-0.9 UNIT/ML-% IJ SOLN
INTRAMUSCULAR | Status: AC
  Filled 2012-05-17: qty 1000

## 2012-05-17 MED ORDER — ASPIRIN 81 MG PO CHEW
324.0000 mg | CHEWABLE_TABLET | ORAL | Status: AC
  Administered 2012-05-17: 324 mg via ORAL
  Filled 2012-05-17: qty 4

## 2012-05-17 MED ORDER — METFORMIN HCL 500 MG PO TABS: 500.0000 mg | ORAL_TABLET | Freq: Two times a day (BID) | ORAL | Status: DC

## 2012-05-17 MED ORDER — SODIUM CHLORIDE 0.9 % IV SOLN
1.0000 mL/kg/h | INTRAVENOUS | Status: DC
  Administered 2012-05-17 (×2): 1 mL/kg/h via INTRAVENOUS

## 2012-05-17 MED ORDER — SODIUM CHLORIDE 0.9 % IV SOLN: 1.0000 mL/kg/h | INTRAVENOUS | Status: AC

## 2012-05-17 MED ORDER — DIAZEPAM 5 MG PO TABS
5.0000 mg | ORAL_TABLET | ORAL | Status: AC
  Administered 2012-05-17: 5 mg via ORAL
  Filled 2012-05-17: qty 1

## 2012-05-17 MED ORDER — LIDOCAINE HCL (PF) 1 % IJ SOLN
INTRAMUSCULAR | Status: AC
  Filled 2012-05-17: qty 30

## 2012-05-17 NOTE — CV Procedure (Signed)
   Cardiac Catheterization Procedure Note  Name: Fotios Amos MRN: [id number] DOB: 03-13-1956  Procedure: Left Heart Cath, Selective Coronary Angiography, LV angiography  Indication: 57 yo BM with multiple cardiac risk factors presents with refractory chest pain.   Procedural Details: The right wrist was prepped, draped, and anesthetized with 1% lidocaine. Using the modified Seldinger technique, a 5 French sheath was introduced into the right radial artery. 3 mg of verapamil was administered through the sheath, weight-based unfractionated heparin was administered intravenously. Standard Judkins catheters were used for selective coronary angiography and left ventriculography. Catheter exchanges were performed over an exchange length guidewire. There were no immediate procedural complications. A TR band was used for radial hemostasis at the completion of the procedure.  The patient was transferred to the post catheterization recovery area for further monitoring.  Procedural Findings: Hemodynamics: AO 107/68 mean 86 mm Hg LV 106/11 mm Hg  Coronary angiography: Coronary dominance: right  Left mainstem: Normal  Left anterior descending (LAD): Normal  Left circumflex (LCx): Normal  Right coronary artery (RCA): Normal  Left ventriculography: Left ventricular systolic function is normal, LVEF is estimated at 55-65%, there is no significant mitral regurgitation   Final Conclusions:   1. Normal coronary anatomy 2. Normal LV function.  Recommendations: Consider alternative causes for chest pain.  Theron Arista [facility] 05/17/2012, 3:06 PM

## 2012-05-17 NOTE — Interval H&P Note (Signed)
History and Physical Interval Note:  05/17/2012 2:39 PM  Jerry Stewart  has presented today for surgery, with the diagnosis of Chest pain  The various methods of treatment have been discussed with the patient and family. After consideration of risks, benefits and other options for treatment, the patient has consented to  Procedure(s): LEFT HEART CATHETERIZATION WITH CORONARY ANGIOGRAM (N/A) as a surgical intervention .  The patient's history has been reviewed, patient examined, no change in status, stable for surgery.  I have reviewed the patient's chart and labs.  Questions were answered to the patient's satisfaction.     Theron Arista [facility] 05/17/2012 2:39 PM

## 2012-05-17 NOTE — Progress Notes (Signed)
Patient ID: Jerry Stewart, male   DOB: 04/13/1955, 57 y.o.   MRN: [id number]   The patient is scheduled for cardiac catheterization today.  [name], MD

## 2012-05-17 NOTE — Progress Notes (Signed)
[facility] PROGRESS NOTE  Jerry Stewart MRN:[id number] DOB: 02/04/1956 DOA: 05/16/2012 PCP: Jerry DAVID, MD  Assessment/Plan:  Chest pain, midsternal: improved.  He has strong risk factors of diabetes, obesity, hyperlipidemia, active nicotine use.   serial cardiac enzymes x3 negative , EKG reviewed, T wave inversions in lead 3,avf . hgba1c is 5.5 - place on aspirin, nitroglycerin paste,  - Given his strong risk factors and possible unstable angina, cardiology consult obtained, - plan for cardiac cath today.   Active Problems:  Sarcoidosis: Follows Dr. Clance, pulmonology  HYPERLIPIDEMIA-  - Obtain lipid panel, continue fenofibrate  OVERWEIGHT/OBESITY:  - Patient was counseled on diet and weight control  HIATAL HERNIA  - Place on PPI  Diabetes mellitus  CBG (last 3)   Recent Labs  05/16/12 1649 05/16/12 2056 05/17/12 0752  GLUCAP 112* 138* 130*    hgba1c is 5.5 DVT prophylaxis: Heparin subcutaneous   Further plan will depend as patient's clinical course evolves and further radiologic and laboratory data become available.     Code Status: full code Family Communication: none at bedside Disposition Plan: pending CATH.    Consultants:  cardiology  HPI/Subjective: Chest pain improved.   Objective: Filed Vitals:   05/16/12 1400 05/16/12 1600 05/16/12 2038 05/17/12 0513  BP: 107/70 111/69 119/67 110/72  Pulse: 66 66 62 72  Temp:  97.6 F (36.4 C) 98.6 F (37 C) 98.4 F (36.9 C)  TempSrc:  Oral Oral Oral  Resp: 28 21 20 20  Height:  6' (1.829 m)    Weight:  104.3 kg (229 lb 15 oz)  104.962 kg (231 lb 6.4 oz)  SpO2: 96% 100% 98% 98%    Intake/Output Summary (Last 24 hours) at 05/17/12 0824 Last data filed at 05/16/12 1825  Gross per 24 hour  Intake  577.5 ml  Output      0 ml  Net  577.5 ml   Filed Weights   05/16/12 1204 05/16/12 1600 05/17/12 0513  Weight: 107.049 kg (236 lb) 104.3 kg (229 lb 15 oz) 104.962 kg (231 lb 6.4 oz)     Exam: Gen : alert afebrile comfortable.  Heart: Regular rate and rhythm, without murmurs, rubs or gallops.  Lungs/chest: Clear to auscultation bilaterally, no wheezing, rales or rhonchi.  Abdomen: Soft, nontender, nondistended, positive bowel sounds, no masses.  Extremities: No clubbing, cyanosis or edema with positive pedal pulses.   Data Reviewed: Basic Metabolic Panel:  Recent Labs Lab 05/16/12 1220 05/16/12 1647 05/17/12 0448  NA 140  --  139  K 3.9  --  3.9  CL 109  --  107  CO2  --   --  22  GLUCOSE 113*  --  114*  BUN 9  --  11  CREATININE 0.70 0.80 0.91  CALCIUM  --   --  8.9   Liver Function Tests: No results found for this basename: AST, ALT, ALKPHOS, BILITOT, PROT, ALBUMIN,  in the last 168 hours No results found for this basename: LIPASE, AMYLASE,  in the last 168 hours No results found for this basename: AMMONIA,  in the last 168 hours CBC:  Recent Labs Lab 05/16/12 1206 05/16/12 1220 05/16/12 1647 05/17/12 0448  WBC 5.8  --  6.6 5.7  HGB 15.7 16.3 16.1 14.7  HCT 45.8 48.0 45.7 43.2  MCV 86.1  --  86.2 85.7  PLT 214  --  209 201   Cardiac Enzymes:  Recent Labs Lab 05/16/12 1648 05/16/12 2219 05/17/12 0430  CKTOTAL 273*   209 151  CKMB 5.8* 4.7* 3.7  TROPONINI <0.30 <0.30 <0.30   BNP (last 3 results) No results found for this basename: PROBNP,  in the last 8760 hours CBG:  Recent Labs Lab 05/16/12 1649 05/16/12 2056 05/17/12 0752  GLUCAP 112* 138* 130*    No results found for this or any previous visit (from the past 240 hour(s)).   Studies: Dg Chest 2 View  05/16/2012  *RADIOLOGY REPORT*  Clinical Data: Chest pain; history of sarcoidosis  CHEST - 2 VIEW  Comparison:  October 04, 2011  Findings: Interstitium is prominent in the upper lobes, a stable finding.  There is no new opacity.  There is no frank edema or consolidation.  Right paratracheal adenopathy remains.  The hila appear borderline prominent but stable bilaterally.  There is  no evidence of new adenopathy.  There is degenerative change in the thoracic spine. Heart size and pulmonary vascularity appear unremarkable.  IMPRESSION: There are changes suggesting underlying sarcoidosis which are essentially stable.  The hila are perhaps marginally less prominent than on the previous study.  There is patchy interstitial disease in the upper lobes, stable, a finding that is typical of sarcoidosis.  There is no edema or consolidation.   Original Report Authenticated By: Jerry Stewart, M.D.     Scheduled Meds: . [START ON 05/18/2012] aspirin EC  325 mg Oral Daily  . diazepam  5 mg Oral On Call  . fenofibrate  160 mg Oral Daily  . heparin  5,000 Units Subcutaneous Q8H  . insulin aspart  0-5 Units Subcutaneous QHS  . insulin aspart  0-9 Units Subcutaneous TID WC  . nicotine  21 mg Transdermal Daily  . nitroGLYCERIN  1 inch Topical Q6H  . pantoprazole  40 mg Oral QAC breakfast  . pneumococcal 23 valent vaccine  0.5 mL Intramuscular Tomorrow-1000  . sodium chloride  3 mL Intravenous Q12H   Continuous Infusions: . sodium chloride 75 mL/hr at 05/16/12 1707  . sodium chloride 1 mL/kg/hr (05/17/12 0633)    Principal Problem:   Chest pain, midsternal Active Problems:   Sarcoidosis   HYPERLIPIDEMIA   OVERWEIGHT/OBESITY   HIATAL HERNIA   Diabetes mellitus        Jerry Stewart  [facility] Pager [phone]. If 8PM-8AM, please contact night-coverage at [email or web address], password [email or web address] 05/17/2012, 8:24 AM  LOS: 1 day              

## 2012-05-17 NOTE — H&P (View-Only) (Signed)
[facility] PROGRESS NOTE  Jerry Stewart WUJ:[id number] DOB: 28-Jun-1955 DOA: 05/16/2012 PCP: Nadean Corwin, MD  Assessment/Plan:  Chest pain, midsternal: improved.  He has strong risk factors of diabetes, obesity, hyperlipidemia, active nicotine use.   serial cardiac enzymes x3 negative , EKG reviewed, T wave inversions in lead 3,avf . hgba1c is 5.5 - place on aspirin, nitroglycerin paste,  - Given his strong risk factors and possible unstable angina, cardiology consult obtained, - plan for cardiac cath today.   Active Problems:  Sarcoidosis: Follows Dr. Shelle Iron, pulmonology  HYPERLIPIDEMIA-  - Obtain lipid panel, continue fenofibrate  OVERWEIGHT/OBESITY:  - Patient was counseled on diet and weight control  HIATAL HERNIA  - Place on PPI  Diabetes mellitus  CBG (last 3)   Recent Labs  05/16/12 1649 05/16/12 2056 05/17/12 0752  GLUCAP 112* 138* 130*    hgba1c is 5.5 DVT prophylaxis: Heparin subcutaneous   Further plan will depend as patient's clinical course evolves and further radiologic and laboratory data become available.     Code Status: full code Family Communication: none at bedside Disposition Plan: pending CATH.    Consultants:  cardiology  HPI/Subjective: Chest pain improved.   Objective: Filed Vitals:   05/16/12 1400 05/16/12 1600 05/16/12 2038 05/17/12 0513  BP: 107/70 111/69 119/67 110/72  Pulse: 66 66 62 72  Temp:  97.6 F (36.4 C) 98.6 F (37 C) 98.4 F (36.9 C)  TempSrc:  Oral Oral Oral  Resp: 28 21 20 20   Height:  6' (1.829 m)    Weight:  104.3 kg (229 lb 15 oz)  104.962 kg (231 lb 6.4 oz)  SpO2: 96% 100% 98% 98%    Intake/Output Summary (Last 24 hours) at 05/17/12 0824 Last data filed at 05/16/12 1825  Gross per 24 hour  Intake  577.5 ml  Output      0 ml  Net  577.5 ml   Filed Weights   05/16/12 1204 05/16/12 1600 05/17/12 0513  Weight: 107.049 kg (236 lb) 104.3 kg (229 lb 15 oz) 104.962 kg (231 lb 6.4 oz)     Exam: Gen : alert afebrile comfortable.  Heart: Regular rate and rhythm, without murmurs, rubs or gallops.  Lungs/chest: Clear to auscultation bilaterally, no wheezing, rales or rhonchi.  Abdomen: Soft, nontender, nondistended, positive bowel sounds, no masses.  Extremities: No clubbing, cyanosis or edema with positive pedal pulses.   Data Reviewed: Basic Metabolic Panel:  Recent Labs Lab 05/16/12 1220 05/16/12 1647 05/17/12 0448  NA 140  --  139  K 3.9  --  3.9  CL 109  --  107  CO2  --   --  22  GLUCOSE 113*  --  114*  BUN 9  --  11  CREATININE 0.70 0.80 0.91  CALCIUM  --   --  8.9   Liver Function Tests: No results found for this basename: AST, ALT, ALKPHOS, BILITOT, PROT, ALBUMIN,  in the last 168 hours No results found for this basename: LIPASE, AMYLASE,  in the last 168 hours No results found for this basename: AMMONIA,  in the last 168 hours CBC:  Recent Labs Lab 05/16/12 1206 05/16/12 1220 05/16/12 1647 05/17/12 0448  WBC 5.8  --  6.6 5.7  HGB 15.7 16.3 16.1 14.7  HCT 45.8 48.0 45.7 43.2  MCV 86.1  --  86.2 85.7  PLT 214  --  209 201   Cardiac Enzymes:  Recent Labs Lab 05/16/12 1648 05/16/12 2219 05/17/12 0430  CKTOTAL 273*  209 151  CKMB 5.8* 4.7* 3.7  TROPONINI <0.30 <0.30 <0.30   BNP (last 3 results) No results found for this basename: PROBNP,  in the last 8760 hours CBG:  Recent Labs Lab 05/16/12 1649 05/16/12 2056 05/17/12 0752  GLUCAP 112* 138* 130*    No results found for this or any previous visit (from the past 240 hour(s)).   Studies: Dg Chest 2 View  05/16/2012  *RADIOLOGY REPORT*  Clinical Data: Chest pain; history of sarcoidosis  CHEST - 2 VIEW  Comparison:  October 04, 2011  Findings: Interstitium is prominent in the upper lobes, a stable finding.  There is no new opacity.  There is no frank edema or consolidation.  Right paratracheal adenopathy remains.  The hila appear borderline prominent but stable bilaterally.  There is  no evidence of new adenopathy.  There is degenerative change in the thoracic spine. Heart size and pulmonary vascularity appear unremarkable.  IMPRESSION: There are changes suggesting underlying sarcoidosis which are essentially stable.  The hila are perhaps marginally less prominent than on the previous study.  There is patchy interstitial disease in the upper lobes, stable, a finding that is typical of sarcoidosis.  There is no edema or consolidation.   Original Report Authenticated By: Bretta Bang, M.D.     Scheduled Meds: . [START ON 05/18/2012] aspirin EC  325 mg Oral Daily  . diazepam  5 mg Oral On Call  . fenofibrate  160 mg Oral Daily  . heparin  5,000 Units Subcutaneous Q8H  . insulin aspart  0-5 Units Subcutaneous QHS  . insulin aspart  0-9 Units Subcutaneous TID WC  . nicotine  21 mg Transdermal Daily  . nitroGLYCERIN  1 inch Topical Q6H  . pantoprazole  40 mg Oral QAC breakfast  . pneumococcal 23 valent vaccine  0.5 mL Intramuscular Tomorrow-1000  . sodium chloride  3 mL Intravenous Q12H   Continuous Infusions: . sodium chloride 75 mL/hr at 05/16/12 1707  . sodium chloride 1 mL/kg/hr (05/17/12 4696)    Principal Problem:   Chest pain, midsternal Active Problems:   Sarcoidosis   HYPERLIPIDEMIA   OVERWEIGHT/OBESITY   HIATAL HERNIA   Diabetes mellitus        Marqui Formby  [facility] Pager [phone]. If 8PM-8AM, please contact night-coverage at [email or web address], password [facility] 05/17/2012, 8:24 AM  LOS: 1 day

## 2012-05-19 NOTE — ED Provider Notes (Signed)
I have personally seen and examined the patient.  I have discussed the plan of care with the resident.  I have reviewed the documentation on PMH/FH/Soc. History.  I have reviewed the documentation of the resident and agree.  I have reviewed and agree with the ECG interpretation(s) documented by the resident.   Joya Gaskins, MD 05/19/12 [phone]

## 2012-05-21 NOTE — Discharge Summary (Signed)
Physician Discharge Summary  Jerry Stewart ION:[id number] DOB: [birth year] DOA: 05/16/2012  PCP: Nadean Corwin, MD  Admit date: 05/16/2012 Discharge date: 05/17/2012   Recommendations for Outpatient Follow-up:  1. Follow up with PCP in 1 to 2 weeks.   Discharge Diagnoses:  Principal Problem:   Chest pain, midsternal Active Problems:   Sarcoidosis   HYPERLIPIDEMIA   OVERWEIGHT/OBESITY   HIATAL HERNIA   Diabetes mellitus   Discharge Condition: stable  Diet recommendation: low sodium diet  Filed Weights   05/16/12 1204 05/16/12 1600 05/17/12 0513  Weight: 107.049 kg (236 lb) 104.3 kg (229 lb 15 oz) 104.962 kg (231 lb 6.4 oz)    History of present illness:  Patient is a 57 year old male with history of diabetes, hyperlipidemia, active smoking presented to ED with intermittent chest pain for almost a week. History was obtained from the patient who stated that he has been having midsternal and left shoulder pain radiating down to the arm with intermittent episodes for almost a week. Patient states that the chest pain has no association with breathing or eating. He states that the episodes last from 15 minutes to half an hour, resolved spontaneously and coming at rest as well. At the time of the triage, patient's chest pain was 8/10 which improved after aspirin and 2 sublingual nitroglycerin's to 1-2/10 at the time of my examination. He denied any significant palpitations, diaphoresis or shortness of breath. Patient states he that he had a stress test to 3 years ago and was negative. He does not take aspirin at home, denies any family history of premature coronary disease.   Hospital Course:   Chest pain, midsternal:  Resolved. He has strong risk factors of diabetes, obesity, hyperlipidemia, active nicotine use.  serial cardiac enzymes x3 negative , EKG reviewed, T wave inversions in lead 3,avf . hgba1c is 5.5   placed on aspirin,. - Given his strong risk factors and possible  unstable angina, cardiology consult obtained, he underwent cardiac cath which was normal.    Sarcoidosis: Follows Dr. Shelle Iron, pulmonology  HYPERLIPIDEMIA-   continue fenofibrate  OVERWEIGHT/OBESITY:  - Patient was counseled on diet and weight control   HIATAL HERNIA   - Place on PPI   Diabetes mellitus  CBG (last 3)   Recent Labs   05/16/12 1649  05/16/12 2056  05/17/12 0752   GLUCAP  112*  138*  130*    hgba1c is 5.5  Procedures: Left Heart Cath, Selective Coronary Angiography, LV angiography   Final Conclusions:  1. Normal coronary anatomy  2. Normal LV function.   Consultations:  cardiology  Discharge Exam: Filed Vitals:   05/16/12 2038 05/17/12 0513 05/17/12 1359 05/17/12 1417  BP: 119/67 110/72 118/73   Pulse: 62 72 82 71  Temp: 98.6 F (37 C) 98.4 F (36.9 C) 97.9 F (36.6 C)   TempSrc: Oral Oral Oral   Resp: 20 20 18    Height:      Weight:  104.962 kg (231 lb 6.4 oz)    SpO2: 98% 98% 95%     Gen : alert afebrile comfortable.  Heart: Regular rate and rhythm, without murmurs, rubs or gallops.  Lungs/chest: Clear to auscultation bilaterally, no wheezing, rales or rhonchi.  Abdomen: Soft, nontender, nondistended, positive bowel sounds, no masses.  Extremities: No clubbing, cyanosis or edema with positive pedal pulses.  Discharge Instructions  Discharge Orders   Future Orders Complete By Expires     Activity as tolerated - No restrictions  As directed  Discharge instructions  As directed     Comments:      Follow up with PCP as needed.        Medication List    TAKE these medications       fenofibrate micronized 134 MG capsule  Commonly known as:  LOFIBRA  Take 134 mg by mouth daily before breakfast.     metFORMIN 500 MG tablet  Commonly known as:  GLUCOPHAGE  Take 1 tablet (500 mg total) by mouth 2 (two) times daily with a meal.     pantoprazole 40 MG tablet  Commonly known as:  PROTONIX  Take 1 tablet (40 mg total) by mouth  daily before breakfast.          The results of significant diagnostics from this hospitalization (including imaging, microbiology, ancillary and laboratory) are listed below for reference.    Significant Diagnostic Studies: Dg Chest 2 View  05/16/2012  *RADIOLOGY REPORT*  Clinical Data: Chest pain; history of sarcoidosis  CHEST - 2 VIEW  Comparison:  October 04, 2011  Findings: Interstitium is prominent in the upper lobes, a stable finding.  There is no new opacity.  There is no frank edema or consolidation.  Right paratracheal adenopathy remains.  The hila appear borderline prominent but stable bilaterally.  There is no evidence of new adenopathy.  There is degenerative change in the thoracic spine. Heart size and pulmonary vascularity appear unremarkable.  IMPRESSION: There are changes suggesting underlying sarcoidosis which are essentially stable.  The hila are perhaps marginally less prominent than on the previous study.  There is patchy interstitial disease in the upper lobes, stable, a finding that is typical of sarcoidosis.  There is no edema or consolidation.   Original Report Authenticated By: Bretta Bang, M.D.     Microbiology: No results found for this or any previous visit (from the past 240 hour(s)).   Labs: Basic Metabolic Panel:  Recent Labs Lab 05/16/12 1220 05/16/12 1647 05/17/12 0448  NA 140  --  139  K 3.9  --  3.9  CL 109  --  107  CO2  --   --  22  GLUCOSE 113*  --  114*  BUN 9  --  11  CREATININE 0.70 0.80 0.91  CALCIUM  --   --  8.9   Liver Function Tests: No results found for this basename: AST, ALT, ALKPHOS, BILITOT, PROT, ALBUMIN,  in the last 168 hours No results found for this basename: LIPASE, AMYLASE,  in the last 168 hours No results found for this basename: AMMONIA,  in the last 168 hours CBC:  Recent Labs Lab 05/16/12 1206 05/16/12 1220 05/16/12 1647 05/17/12 0448  WBC 5.8  --  6.6 5.7  HGB 15.7 16.3 16.1 14.7  HCT 45.8 48.0 45.7 43.2   MCV 86.1  --  86.2 85.7  PLT 214  --  209 201   Cardiac Enzymes:  Recent Labs Lab 05/16/12 1648 05/16/12 2219 05/17/12 0430  CKTOTAL 273* 209 151  CKMB 5.8* 4.7* 3.7  TROPONINI <0.30 <0.30 <0.30   BNP: BNP (last 3 results) No results found for this basename: PROBNP,  in the last 8760 hours CBG:  Recent Labs Lab 05/16/12 2056 05/17/12 0752 05/17/12 1119 05/17/12 1527 05/17/12 1703  GLUCAP 138* 130* 120* 109* 111*       Signed:  Broc Caspers  [facility] 05/17/2012, 10:09 AM

## 2012-05-22 ENCOUNTER — Ambulatory Visit (HOSPITAL_COMMUNITY)
Admission: RE | Admit: 2012-05-22 | Discharge: 2012-05-22 | Disposition: A | Discharge status: Home / Self Care | Payer: Managed Care, Other (non HMO) | Source: Ambulatory Visit | Attending: Internal Medicine | Admitting: Internal Medicine

## 2012-05-22 ENCOUNTER — Other Ambulatory Visit (HOSPITAL_COMMUNITY): Payer: Self-pay | Admitting: Internal Medicine

## 2012-05-22 DIAGNOSIS — R52 Pain, unspecified: Secondary | ICD-10-CM

## 2012-05-22 DIAGNOSIS — M25519 Pain in unspecified shoulder: Secondary | ICD-10-CM | POA: Insufficient documentation

## 2012-10-03 ENCOUNTER — Ambulatory Visit (HOSPITAL_COMMUNITY)
Admission: RE | Admit: 2012-10-03 | Discharge: 2012-10-03 | Disposition: A | Discharge status: Home / Self Care | Payer: Managed Care, Other (non HMO) | Source: Ambulatory Visit | Attending: Internal Medicine | Admitting: Internal Medicine

## 2012-10-03 ENCOUNTER — Other Ambulatory Visit (HOSPITAL_COMMUNITY): Payer: Self-pay | Admitting: Internal Medicine

## 2012-10-03 DIAGNOSIS — D869 Sarcoidosis, unspecified: Secondary | ICD-10-CM

## 2012-10-03 DIAGNOSIS — R599 Enlarged lymph nodes, unspecified: Secondary | ICD-10-CM | POA: Insufficient documentation

## 2012-10-03 DIAGNOSIS — F17200 Nicotine dependence, unspecified, uncomplicated: Secondary | ICD-10-CM | POA: Insufficient documentation

## 2012-10-03 DIAGNOSIS — J984 Other disorders of lung: Secondary | ICD-10-CM | POA: Insufficient documentation

## 2012-10-09 ENCOUNTER — Other Ambulatory Visit: Payer: Self-pay | Admitting: Internal Medicine

## 2012-10-16 ENCOUNTER — Ambulatory Visit
Admission: RE | Admit: 2012-10-16 | Discharge: 2012-10-16 | Disposition: A | Discharge status: Home / Self Care | Payer: Managed Care, Other (non HMO) | Source: Ambulatory Visit | Attending: Internal Medicine | Admitting: Internal Medicine

## 2012-11-10 ENCOUNTER — Other Ambulatory Visit: Payer: Self-pay | Admitting: Orthopedic Surgery

## 2012-11-10 DIAGNOSIS — M542 Cervicalgia: Secondary | ICD-10-CM

## 2012-11-16 ENCOUNTER — Ambulatory Visit (INDEPENDENT_AMBULATORY_CARE_PROVIDER_SITE_OTHER): Payer: Managed Care, Other (non HMO) | Admitting: Pulmonary Disease

## 2012-11-16 ENCOUNTER — Encounter: Payer: Self-pay | Admitting: Pulmonary Disease

## 2012-11-16 VITALS — BP 142/76 | HR 79 | Temp 98.2°F | Ht 72.0 in | Wt 218.2 lb

## 2012-11-16 DIAGNOSIS — D869 Sarcoidosis, unspecified: Secondary | ICD-10-CM

## 2012-11-16 NOTE — Progress Notes (Signed)
  Subjective:    Patient ID: Jerry Stewart, male    DOB: 18-Mar-1956, 57 y.o.   MRN: [id number]  HPI The patient comes in today for followup of his known pulmonary sarcoidosis.  He has had a stable chest x-ray and PFTs over a prolonged period of time, and currently is asymptomatic.  He denies any significant cough or shortness of breath.  Unfortunately, he is continuing to smoke, and we have had a long conversation about smoking cessation.   Review of Systems  Constitutional: Negative for fever and unexpected weight change.  HENT: Negative for ear pain, nosebleeds, congestion, sore throat, rhinorrhea, sneezing, trouble swallowing, dental problem, postnasal drip and sinus pressure.   Eyes: Negative for redness and itching.  Respiratory: Negative for cough, chest tightness, shortness of breath and wheezing.   Cardiovascular: Negative for palpitations and leg swelling.  Gastrointestinal: Negative for nausea and vomiting.  Genitourinary: Negative for dysuria.  Musculoskeletal: Negative for joint swelling.  Skin: Negative for rash.  Neurological: Negative for headaches.  Hematological: Does not bruise/bleed easily.  Psychiatric/Behavioral: Negative for dysphoric mood. The patient is not nervous/anxious.        Objective:   Physical Exam Well-developed male in no acute distress Nose without purulence or discharge noted Neck without lymphadenopathy or thyromegaly Chest totally clear auscultation, no wheezes or crackles Cardiac exam with regular rate and rhythm, 2/6 murmur Lower extremities without edema, no cyanosis Alert and oriented and moves all 4 extremities.       Assessment & Plan:

## 2012-11-16 NOTE — Assessment & Plan Note (Signed)
The patient has remained stable from a pulmonary standpoint, and he has no evidence for active sarcoidosis.  Provided that he continues to do well, he does not require further pulmonary followup.  However, he would benefit from a yearly chest x-ray which can be done by his primary care physician as part of his yearly physical.  I have also had a long discussion with him about smoking cessation, and he has tried Chantix in the past without success.  I really think this comes down to him truly wanting to quit smoking, and have discussed other options to help.

## 2012-11-16 NOTE — Patient Instructions (Addendum)
Work on smoking cessation as we discussed.   followup with me as needed if you have worsening pulmonary symptoms.

## 2012-11-17 ENCOUNTER — Other Ambulatory Visit: Payer: Managed Care, Other (non HMO)

## 2012-11-25 ENCOUNTER — Ambulatory Visit
Admission: RE | Admit: 2012-11-25 | Discharge: 2012-11-25 | Disposition: A | Discharge status: Home / Self Care | Payer: 59 | Source: Ambulatory Visit | Attending: Orthopedic Surgery | Admitting: Orthopedic Surgery

## 2012-11-25 DIAGNOSIS — M542 Cervicalgia: Secondary | ICD-10-CM

## 2013-03-03 ENCOUNTER — Encounter: Payer: Self-pay | Admitting: Internal Medicine

## 2013-03-03 DIAGNOSIS — D696 Thrombocytopenia, unspecified: Secondary | ICD-10-CM | POA: Insufficient documentation

## 2013-03-03 DIAGNOSIS — K635 Polyp of colon: Secondary | ICD-10-CM | POA: Insufficient documentation

## 2013-03-03 DIAGNOSIS — F1290 Cannabis use, unspecified, uncomplicated: Secondary | ICD-10-CM | POA: Insufficient documentation

## 2013-03-03 DIAGNOSIS — M1990 Unspecified osteoarthritis, unspecified site: Secondary | ICD-10-CM | POA: Insufficient documentation

## 2013-03-03 DIAGNOSIS — R7303 Prediabetes: Secondary | ICD-10-CM | POA: Insufficient documentation

## 2013-03-03 DIAGNOSIS — K449 Diaphragmatic hernia without obstruction or gangrene: Secondary | ICD-10-CM | POA: Insufficient documentation

## 2013-03-03 DIAGNOSIS — Z720 Tobacco use: Secondary | ICD-10-CM | POA: Insufficient documentation

## 2013-03-03 DIAGNOSIS — M5136 Other intervertebral disc degeneration, lumbar region: Secondary | ICD-10-CM | POA: Insufficient documentation

## 2013-03-03 DIAGNOSIS — B1920 Unspecified viral hepatitis C without hepatic coma: Secondary | ICD-10-CM | POA: Insufficient documentation

## 2013-03-05 ENCOUNTER — Ambulatory Visit (INDEPENDENT_AMBULATORY_CARE_PROVIDER_SITE_OTHER): Payer: 59 | Admitting: Emergency Medicine

## 2013-03-05 ENCOUNTER — Ambulatory Visit (HOSPITAL_COMMUNITY)
Admission: RE | Admit: 2013-03-05 | Discharge: 2013-03-05 | Disposition: A | Discharge status: Home / Self Care | Payer: Managed Care, Other (non HMO) | Source: Ambulatory Visit | Attending: Emergency Medicine | Admitting: Emergency Medicine

## 2013-03-05 ENCOUNTER — Encounter: Payer: Self-pay | Admitting: Emergency Medicine

## 2013-03-05 VITALS — BP 126/88 | HR 78 | Temp 98.2°F | Resp 18 | Ht 72.0 in | Wt 219.0 lb

## 2013-03-05 DIAGNOSIS — M259 Joint disorder, unspecified: Secondary | ICD-10-CM | POA: Insufficient documentation

## 2013-03-05 DIAGNOSIS — M25519 Pain in unspecified shoulder: Secondary | ICD-10-CM | POA: Insufficient documentation

## 2013-03-05 DIAGNOSIS — M25511 Pain in right shoulder: Secondary | ICD-10-CM

## 2013-03-05 DIAGNOSIS — Z79899 Other long term (current) drug therapy: Secondary | ICD-10-CM

## 2013-03-05 DIAGNOSIS — E559 Vitamin D deficiency, unspecified: Secondary | ICD-10-CM

## 2013-03-05 DIAGNOSIS — E781 Pure hyperglyceridemia: Secondary | ICD-10-CM

## 2013-03-05 DIAGNOSIS — E119 Type 2 diabetes mellitus without complications: Secondary | ICD-10-CM

## 2013-03-05 LAB — CBC WITH DIFFERENTIAL/PLATELET
Basophils Relative: 1 % (ref 0–1)
Eosinophils Relative: 4 % (ref 0–5)
HCT: 47.4 % (ref 39.0–52.0)
Hemoglobin: 16.7 g/dL (ref 13.0–17.0)
Lymphocytes Relative: 24 % (ref 12–46)
MCHC: 35.2 g/dL (ref 30.0–36.0)
MCV: 85.9 fL (ref 78.0–100.0)
Monocytes Absolute: 0.6 10*3/uL (ref 0.1–1.0)
Monocytes Relative: 9 % (ref 3–12)
Neutro Abs: 3.7 10*3/uL (ref 1.7–7.7)
RDW: 13.7 % (ref 11.5–15.5)

## 2013-03-05 LAB — HEPATIC FUNCTION PANEL
ALT: 30 U/L (ref 0–53)
AST: 30 U/L (ref 0–37)
Albumin: 4.3 g/dL (ref 3.5–5.2)
Alkaline Phosphatase: 82 U/L (ref 39–117)
Indirect Bilirubin: 0.4 mg/dL (ref 0.0–0.9)
Total Protein: 7.1 g/dL (ref 6.0–8.3)

## 2013-03-05 LAB — HEMOGLOBIN A1C: Mean Plasma Glucose: 108 mg/dL (ref <117)

## 2013-03-05 LAB — LIPID PANEL
Cholesterol: 145 mg/dL (ref 0–200)
HDL: 25 mg/dL — ABNORMAL LOW (ref >39)
Triglycerides: 739 mg/dL — ABNORMAL HIGH (ref <150)

## 2013-03-05 NOTE — Patient Instructions (Signed)
Shoulder Pain The shoulder is the joint that connects your arm to your body. Muscles and band-like tissues that connect bones to muscles (tendons) hold the joint together. Shoulder pain is felt if an injury or medical problem affects one or more parts of the shoulder. HOME CARE   Put ice on the sore area.  Put ice in a plastic bag.  Place a towel between your skin and the bag.  Leave the ice on for 15-20 minutes, 03-04 times a day for the first 2 days.  Stop using cold packs if they do not help with the pain.  If you were given something to keep your shoulder from moving (sling, shoulder immobilizer), wear it as told. Only take it off to shower or bathe.  Move your arm as little as possible, but keep your hand moving to prevent puffiness (swelling).  Squeeze a soft ball or foam pad as much as possible to help prevent swelling.  Take medicine as told by your doctor. GET HELP RIGHT AWAY IF:   Your arm, hand, or fingers are numb or tingling.  Your arm, hand, or fingers are puffy (swollen), painful, or turn white or blue.  You have more pain.  You have progressing new pain in your arm, hand, or fingers.  Your hand or fingers get cold.  Your medicine does not help lessen your pain. MAKE SURE YOU:   Understand these instructions.  Will watch your condition.  Will get help right away if you are not doing well or get worse. Document Released: 09/01/2007 Document Revised: 12/08/2011 Document Reviewed: 09/27/2011 [employer] Patient Information 2014 [employer].  

## 2013-03-06 ENCOUNTER — Other Ambulatory Visit: Payer: Self-pay | Admitting: Emergency Medicine

## 2013-03-06 DIAGNOSIS — E781 Pure hyperglyceridemia: Secondary | ICD-10-CM

## 2013-03-06 LAB — VITAMIN D 25 HYDROXY (VIT D DEFICIENCY, FRACTURES): Vit D, 25-Hydroxy: 28 ng/mL — ABNORMAL LOW (ref 30–89)

## 2013-03-06 NOTE — Progress Notes (Signed)
Subjective:    Patient ID: Jerry Stewart, male    DOB: 06/26/1955, 57 y.o.   MRN: [id number]  HPI Comments: 57 yo AAM presents for 3 month F/U for Cholesterol, DM, D. Deficient follow up. He admits to not eating as healthy or exercising, but notes decreasing portions. He is trying to lose weight and is down 8 #. He stays busy at work. He checks his feet daily and denies any breakdown or neuro symptoms. His BS averages around 100 at home. LAST ABN LABS TG 317 INSULIN 34 D 36.  He notes he is now having pain in right shoulder. He is right handed and does a lot of lifting/ turning at work predominantly with right arm, but denies actual injury at work. He has a history of left shoulder pain and was evaluated by Dr. Renae Fickle and found to also have significant degenerative changes of the CSP on MRI. He notes his left shoulder has resolved. The pain currently is at 3/10 but gets up to 7/10 with tightness from base of neck to shoulder blade to proximal humerus. He denies decreased ROM,  numbness or tingling. He denies any trauma hx.  He is also concerned with wives chronic cough and recent vomiting episodes. He wants to discuss possible DX and treatments.   Neck Pain   Shoulder Pain    Current Outpatient Prescriptions on File Prior to Visit  Medication Sig Dispense Refill  . fenofibrate micronized (LOFIBRA) 134 MG capsule Take 134 mg by mouth daily before breakfast.      . metFORMIN (GLUCOPHAGE) 500 MG tablet Take 1 tablet (500 mg total) by mouth 2 (two) times daily with a meal.  30 tablet  0  . pantoprazole (PROTONIX) 40 MG tablet Take 1 tablet (40 mg total) by mouth daily before breakfast.  30 tablet  0   No current facility-administered medications on file prior to visit.   ALLERGIES Chantix and Lopid  Past Medical History  Diagnosis Date  . Sarcoid   . Hepatitis C   . Diabetes mellitus without complication   . Hypercholesteremia   . Tobacco abuse   . Hiatal hernia   . Marijuana use   .  Chest pain 05/16/2012  . Thrombocytopenia   . Osteoarthritis   . Colon polyps   . DDD (degenerative disc disease)      Review of Systems  Musculoskeletal: Positive for arthralgias, myalgias, neck pain and neck stiffness.  All other systems reviewed and are negative.    BP 126/88  Pulse 78  Temp(Src) 98.2 F (36.8 C) (Temporal)  Resp 18  Ht 6' (1.829 m)  Wt 219 lb (99.338 kg)  BMI 29.70 kg/m2     Objective:   Physical Exam  Nursing note and vitals reviewed. Constitutional: He is oriented to person, place, and time. He appears well-developed and well-nourished.  HENT:  Head: Normocephalic and atraumatic.  Right Ear: External ear normal.  Left Ear: External ear normal.  Nose: Nose normal.  Eyes: Conjunctivae and EOM are normal.  Neck: Normal range of motion. Neck supple. No JVD present. No thyromegaly present.  Cardiovascular: Normal rate, regular rhythm, normal heart sounds and intact distal pulses.   Pulmonary/Chest: Effort normal and breath sounds normal.  Abdominal: Soft. Bowel sounds are normal. He exhibits no distension and no mass. There is no tenderness. There is no rebound and no guarding.  Musculoskeletal: Normal range of motion. He exhibits no edema and no tenderness.  Mild discomfort with ROM of right shoulder  and neck but full ROM accomplished  Lymphadenopathy:    He has no cervical adenopathy.  Neurological: He is alert and oriented to person, place, and time. He has normal reflexes. No cranial nerve deficit. Coordination normal.  Skin: Skin is warm and dry.  Psychiatric: He has a normal mood and affect. His behavior is normal. Judgment and thought content normal.          Assessment & Plan:  1.  3 month F/U for Cholesterol, DM, D. Deficient- check labs, needs better diet, wt loss, cardio QD 2. Right shoulder pain- Xray, stretches explained, advised with review of MRI Csp needs Ortho f/u with significant deg changes could contribute to pain. SX Vimovo  500/20 BID given #42 3. Patient wanted to discuss wives chronic cough and Vomitus- advised of possible diagnosis and need for evaluation    OVER 40 minutes of exam, counseling, chart review, referral suggested for Ortho patient w/c if no change.

## 2013-05-03 ENCOUNTER — Telehealth: Payer: Self-pay | Admitting: Internal Medicine

## 2013-05-03 NOTE — Telephone Encounter (Signed)
Message copied by Tama HeadingsWELCH, KATRINA A on Thu May 03, 2013  5:20 PM ------      Message from: Loree FeeSMITH, MELISSA R      Created: Tue Mar 06, 2013  4:38 AM       Trig HORRIBLE. Stop Fenofibrate. STOP fried, carbs, sugars and ETOH (if Drinks). Really need strict diet, cadio and weight loss. Vitamin D low add 5000 Iu, Magnesium low add 250 mg. Needs recheck 4 weeks of Cholesterol Lab only. ------

## 2013-05-03 NOTE — Telephone Encounter (Signed)
CALLED PT TO SCHEDULE, NO ANSWER, MAILED A CARD TO CALL THE OFFICE TO MAKE OV

## 2013-06-08 ENCOUNTER — Encounter: Payer: Self-pay | Admitting: Emergency Medicine

## 2013-06-08 ENCOUNTER — Ambulatory Visit (INDEPENDENT_AMBULATORY_CARE_PROVIDER_SITE_OTHER): Payer: 59 | Admitting: Emergency Medicine

## 2013-06-08 VITALS — BP 122/70 | HR 74 | Temp 98.2°F | Resp 18 | Ht 72.0 in | Wt 220.0 lb

## 2013-06-08 DIAGNOSIS — R030 Elevated blood-pressure reading, without diagnosis of hypertension: Secondary | ICD-10-CM

## 2013-06-08 DIAGNOSIS — E782 Mixed hyperlipidemia: Secondary | ICD-10-CM

## 2013-06-08 DIAGNOSIS — M25519 Pain in unspecified shoulder: Secondary | ICD-10-CM

## 2013-06-08 DIAGNOSIS — E119 Type 2 diabetes mellitus without complications: Secondary | ICD-10-CM

## 2013-06-08 LAB — CBC WITH DIFFERENTIAL/PLATELET
Basophils Absolute: 0.1 10*3/uL (ref 0.0–0.1)
Basophils Relative: 1 % (ref 0–1)
Eosinophils Absolute: 0.2 10*3/uL (ref 0.0–0.7)
Eosinophils Relative: 3 % (ref 0–5)
HCT: 46.2 % (ref 39.0–52.0)
HEMOGLOBIN: 16.3 g/dL (ref 13.0–17.0)
LYMPHS ABS: 1.3 10*3/uL (ref 0.7–4.0)
LYMPHS PCT: 21 % (ref 12–46)
MCH: 30.5 pg (ref 26.0–34.0)
MCHC: 35.3 g/dL (ref 30.0–36.0)
MCV: 86.4 fL (ref 78.0–100.0)
MONOS PCT: 9 % (ref 3–12)
Monocytes Absolute: 0.6 10*3/uL (ref 0.1–1.0)
NEUTROS ABS: 4.1 10*3/uL (ref 1.7–7.7)
NEUTROS PCT: 66 % (ref 43–77)
Platelets: 221 10*3/uL (ref 150–400)
RBC: 5.35 MIL/uL (ref 4.22–5.81)
RDW: 13.7 % (ref 11.5–15.5)
WBC: 6.2 10*3/uL (ref 4.0–10.5)

## 2013-06-08 LAB — HEMOGLOBIN A1C
Hgb A1c MFr Bld: 5 %
Mean Plasma Glucose: 97 mg/dL

## 2013-06-08 NOTE — Progress Notes (Signed)
Subjective:    Patient ID: Jerry Stewart, male    DOB: December 01, 1955, 58 y.o.   MRN: [id number]  HPI Comments: 58 yo male presents for 3 month F/U for HTN, Cholesterol, DM, D. Deficient. He notes he was drinking more liquor at last OV but has d/c and is only drinking beer. He is not exercising due to work schedule. He is trying to improve diet. He eats small portions. He occasionally skips meals. He notes BP ok but rarely checks. He checks feet routinely and denies skin break down or neuropathy increase. He did not add VIT D. He notes he still has shoulder discomfort but has not been doing exercises AD and is out of Vimovo Samples. He notes samples help with discomfort. LDLCALC   *   03/05/2013   Comment:   Not calculated due to Triglyceride >400. Suggest ordering Direct LDL (Unit Code: 40981).   Total Cholesterol/HDL Ratio:CHD Risk                        Coronary Heart Disease Risk Table                                        Men       Women          1/2 Average Risk              3.4        3.3              Average Risk              5.0        4.4           2X Average Risk              9.6        7.1           3X Average Risk             23.4       11.0 Use the calculated Patient Ratio above and the CHD Risk table  to determine the patient's CHD Risk. ATP III Classification (LDL):       < 100        mg/dL         Optimal      191 - 129     mg/dL         Near or Above Optimal      130 - 159     mg/dL         Borderline High      160 - 189     mg/dL         High       > 478        mg/dL         Very High   TRIG         739   03/05/2013 CHOLHDL      5.8   03/05/2013 ALT           30   03/05/2013 AST           30   03/05/2013 ALKPHOS       82   03/05/2013 BILITOT      0.6   03/05/2013 CREATININE     0.91  05/17/2012 BUN              11   05/17/2012 NA              139   05/17/2012 K               3.9   05/17/2012 CL              107   05/17/2012 CO2              22   05/17/2012 HGBA1C      5.4   03/05/2013 VIT D  28    Medication List       This list is accurate as of: 06/08/13 11:54 AM.  Always use your most recent med list.               metFORMIN 500 MG tablet  Commonly known as:  GLUCOPHAGE  Take 500 mg by mouth 3 (three) times daily.       Allergies  Allergen Reactions  . Chantix [Varenicline]     Nausea  . Lopid [Gemfibrozil]     Nausea/weakness   Past Medical History  Diagnosis Date  . Sarcoid   . Hepatitis C   . Diabetes mellitus without complication   . Hypercholesteremia   . Tobacco abuse   . Hiatal hernia   . Marijuana use   . Chest pain 05/16/2012  . Thrombocytopenia   . Osteoarthritis   . Colon polyps   . DDD (degenerative disc disease)       Review of Systems  Musculoskeletal: Positive for arthralgias.  All other systems reviewed and are negative.   BP 122/70  Pulse 74  Temp(Src) 98.2 F (36.8 C) (Temporal)  Resp 18  Ht 6' (1.829 m)  Wt 220 lb (99.791 kg)  BMI 29.83 kg/m2     Objective:   Physical Exam  Nursing note and vitals reviewed. Constitutional: He is oriented to person, place, and time. He appears well-developed and well-nourished.  HENT:  Head: Normocephalic and atraumatic.  Right Ear: External ear normal.  Left Ear: External ear normal.  Nose: Nose normal.  Eyes: Conjunctivae and EOM are normal.  Neck: Normal range of motion. Neck supple. No JVD present. No thyromegaly present.  Cardiovascular: Normal rate, regular rhythm, normal heart sounds and intact distal pulses.   Pulmonary/Chest: Effort normal and breath sounds normal.  Abdominal: Soft. Bowel sounds are normal. He exhibits no distension and no mass. There is no tenderness. There is no rebound and no guarding.  Musculoskeletal: Normal range of motion. He exhibits no edema and no tenderness.  Good ROM both shoulders with mild discomfort  Lymphadenopathy:    He has no cervical adenopathy.  Neurological: He is alert and oriented to person, place, and time. He has normal  reflexes. No cranial nerve deficit. Coordination normal.  Skin: Skin is warm and dry.  Psychiatric: He has a normal mood and affect. His behavior is normal. Judgment and thought content normal.    Foot exam performed/ documented      Assessment & Plan:  1.  3 month F/U for Overweight, Cholesterol,DM, D. Deficient. Needs healthy diet, cardio QD and obtain healthy weight. Check Labs, Check BP if >130/80 call office, Check BS if >200 call office. Callus/ Bunion/ dry skin DM- Advised needs podiatry evaluation with  DM Hx, epsom salt soaks, vaseline treatment QD, monitor QD and call with any concerns/ adverse changes   2. Etoh/ Tobacco USE- strong  discussion about ETOH and Trig, patient will try to decrease use. Will try Cinnamon tooth picks and Cessation tips to d/c cigarettes.  3. Shoulder pain chronic- pt declines re-eval at Ortho he wants more samples of Vimovo, aware of risk with CV disease. W/c when SX arrive. Restart Exercise/ stretches.

## 2013-06-08 NOTE — Patient Instructions (Addendum)
Diabetes and Exercise Exercising regularly is important. It is not just about losing weight. It has many health benefits, such as:  Improving your overall fitness, flexibility, and endurance.  Increasing your bone density.  Helping with weight control.  Decreasing your body fat.  Increasing your muscle strength.  Reducing stress and tension.  Improving your overall health. People with diabetes who exercise gain additional benefits because exercise:  Reduces appetite.  Improves the body's use of blood sugar (glucose).  Helps lower or control blood glucose.  Decreases blood pressure.  Helps control blood lipids (such as cholesterol and triglycerides).  Improves the body's use of the hormone insulin by:  Increasing the body's insulin sensitivity.  Reducing the body's insulin needs.  Decreases the risk for heart disease because exercising:  Lowers cholesterol and triglycerides levels.  Increases the levels of good cholesterol (such as high-density lipoproteins [HDL]) in the body.  Lowers blood glucose levels. YOUR ACTIVITY PLAN  Choose an activity that you enjoy and set realistic goals. Your health care provider or diabetes educator can help you make an activity plan that works for you. You can break activities into 2 or 3 sessions throughout the day. Doing so is as good as one long session. Exercise ideas include:  Taking the dog for a walk.  Taking the stairs instead of the elevator.  Dancing to your favorite song.  Doing your favorite exercise with a friend. RECOMMENDATIONS FOR EXERCISING WITH TYPE 1 OR TYPE 2 DIABETES   Check your blood glucose before exercising. If blood glucose levels are greater than 240 mg/dL, check for urine ketones. Do not exercise if ketones are present.  Avoid injecting insulin into areas of the body that are going to be exercised. For example, avoid injecting insulin into:  The arms when playing tennis.  The legs when  jogging.  Keep a record of:  Food intake before and after you exercise.  Expected peak times of insulin action.  Blood glucose levels before and after you exercise.  The type and amount of exercise you have done.  Review your records with your health care provider. Your health care provider will help you to develop guidelines for adjusting food intake and insulin amounts before and after exercising.  If you take insulin or oral hypoglycemic agents, watch for signs and symptoms of hypoglycemia. They include:  Dizziness.  Shaking.  Sweating.  Chills.  Confusion.  Drink plenty of water while you exercise to prevent dehydration or heat stroke. Body water is lost during exercise and must be replaced.  Talk to your health care provider before starting an exercise program to make sure it is safe for you. Remember, almost any type of activity is better than none. Document Released: 06/05/2003 Document Revised: 11/15/2012 Document Reviewed: 08/22/2012 [facility] Patient Information 2014 [place], [place]. We want weight loss that will last so you should lose 1-2 pounds a week.  THAT IS IT! Please pick THREE things a month to change. Once it is a habit check off the item. Then pick another three items off the list to become habits.  If you are already doing a habit on the list GREAT!  Cross that item off! o Don't drink your calories. Ie, alcohol, soda, fruit juice, and sweet tea.  o Drink more water. Drink a glass when you feel hungry or before each meal.  o Eat breakfast - Complex carb and protein (likeDannon light and fit yogurt, oatmeal, fruit, eggs, [place] bacon). o Measure your cereal.  Eat no  more than one cup a day. (ie [place]) o Eat an apple a day. o Add a vegetable a day. o Try a new vegetable a month. o Use Pam! Stop using oil or butter to cook. o Don't finish your plate or use smaller plates. o Share your dessert. o Eat sugar free Jello for dessert or frozen grapes. o Don't eat  2-3 hours before bed. o Switch to whole wheat bread, pasta, and brown rice. o Make healthier choices when you eat out. No fries! o Pick baked chicken, NOT fried. o Don't forget to SLOW DOWN when you eat. It is not going anywhere.  o Take the stairs. o Park far away in the parking lot o [employer] (or weights) for 10 minutes while watching TV. o Walk at work for 10 minutes during break. o Walk outside 1 time a week with your friend, kids, dog, or significant other. o Start a walking group at church. o Walk the mall as much as you can tolerate.  o Keep a food diary. o Weigh yourself daily. o Walk for 15 minutes 3 days per week. o Cook at home more often and eat out less.  If life happens and you go back to old habits, it is okay.  Just start over. You can do it!   If you experience chest pain, get short of breath, or tired during the exercise, please stop immediately and inform your doctor.   We want weight loss that will last so you should lose 1-2 pounds a week.  THAT IS IT! Please pick THREE things a month to change. Once it is a habit check off the item. Then pick another three items off the list to become habits.  If you are already doing a habit on the list GREAT!  Cross that item off! o Don't drink your calories. Ie, alcohol, soda, fruit juice, and sweet tea.  o Drink more water. Drink a glass when you feel hungry or before each meal.  o Eat breakfast - Complex carb and protein (likeDannon light and fit yogurt, oatmeal, fruit, eggs, [place] bacon). o Measure your cereal.  Eat no more than one cup a day. (ie [place]) o Eat an apple a day. o Add a vegetable a day. o Try a new vegetable a month. o Use Pam! Stop using oil or butter to cook. o Don't finish your plate or use smaller plates. o Share your dessert. o Eat sugar free Jello for dessert or frozen grapes. o Don't eat 2-3 hours before bed. o Switch to whole wheat bread, pasta, and brown rice. o Make healthier choices when you  eat out. No fries! o Pick baked chicken, NOT fried. o Don't forget to SLOW DOWN when you eat. It is not going anywhere.  o Take the stairs. o Park far away in the parking lot o [employer] (or weights) for 10 minutes while watching TV. o Walk at work for 10 minutes during break. o Walk outside 1 time a week with your friend, kids, dog, or significant other. o Start a walking group at church. o Walk the mall as much as you can tolerate.  o Keep a food diary. o Weigh yourself daily. o Walk for 15 minutes 3 days per week. o Cook at home more often and eat out less.  If life happens and you go back to old habits, it is okay.  Just start over. You can do it!   If you experience chest  pain, get short of breath, or tired during the exercise, please stop immediately and inform your doctor.   Diabetes and Foot Care Epsom salt soaks and then vaseline or cocoa butter thick coats every other day Diabetes may cause you to have problems because of poor blood supply (circulation) to your feet and legs. This may cause the skin on your feet to become thinner, break easier, and heal more slowly. Your skin may become dry, and the skin may peel and crack. You may also have nerve damage in your legs and feet causing decreased feeling in them. You may not notice minor injuries to your feet that could lead to infections or more serious problems. Taking care of your feet is one of the most important things you can do for yourself.  HOME CARE INSTRUCTIONS  Wear shoes at all times, even in the house. Do not go barefoot. Bare feet are easily injured.  Check your feet daily for blisters, cuts, and redness. If you cannot see the bottom of your feet, use a mirror or ask someone for help.  Wash your feet with warm water (do not use hot water) and mild soap. Then pat your feet and the areas between your toes until they are completely dry. Do not soak your feet as this can dry your skin.  Apply a moisturizing lotion  or petroleum jelly (that does not contain alcohol and is unscented) to the skin on your feet and to dry, brittle toenails. Do not apply lotion between your toes.  Trim your toenails straight across. Do not dig under them or around the cuticle. File the edges of your nails with an emery board or nail file.  Do not cut corns or calluses or try to remove them with medicine.  Wear clean socks or stockings every day. Make sure they are not too tight. Do not wear knee-high stockings since they may decrease blood flow to your legs.  Wear shoes that fit properly and have enough cushioning. To break in new shoes, wear them for just a few hours a day. This prevents you from injuring your feet. Always look in your shoes before you put them on to be sure there are no objects inside.  Do not cross your legs. This may decrease the blood flow to your feet.  If you find a minor scrape, cut, or break in the skin on your feet, keep it and the skin around it clean and dry. These areas may be cleansed with mild soap and water. Do not cleanse the area with peroxide, alcohol, or iodine.  When you remove an adhesive bandage, be sure not to damage the skin around it.  If you have a wound, look at it several times a day to make sure it is healing.  Do not use heating pads or hot water bottles. They may burn your skin. If you have lost feeling in your feet or legs, you may not know it is happening until it is too late.  Make sure your health care provider performs a complete foot exam at least annually or more often if you have foot problems. Report any cuts, sores, or bruises to your health care provider immediately. SEEK MEDICAL CARE IF:   You have an injury that is not healing.  You have cuts or breaks in the skin.  You have an ingrown nail.  You notice redness on your legs or feet.  You feel burning or tingling in your legs or feet.  You  have pain or cramps in your legs and feet.  Your legs or feet are  numb.  Your feet always feel cold. SEEK IMMEDIATE MEDICAL CARE IF:   There is increasing redness, swelling, or pain in or around a wound.  There is a red line that goes up your leg.  Pus is coming from a wound.  You develop a fever or as directed by your health care provider.  You notice a bad smell coming from an ulcer or wound. Document Released: 03/12/2000 Document Revised: 11/15/2012 Document Reviewed: 08/22/2012 [facility] Patient Information 2014 [place], [place].

## 2013-06-09 LAB — HEPATIC FUNCTION PANEL
ALT: 32 U/L (ref 0–53)
AST: 32 U/L (ref 0–37)
Albumin: 4.2 g/dL (ref 3.5–5.2)
Alkaline Phosphatase: 77 U/L (ref 39–117)
BILIRUBIN INDIRECT: 0.6 mg/dL (ref 0.2–1.2)
Bilirubin, Direct: 0.1 mg/dL (ref 0.0–0.3)
TOTAL PROTEIN: 6.6 g/dL (ref 6.0–8.3)
Total Bilirubin: 0.7 mg/dL (ref 0.2–1.2)

## 2013-06-09 LAB — LIPID PANEL
CHOL/HDL RATIO: 7.5 ratio
CHOLESTEROL: 135 mg/dL (ref 0–200)
HDL: 18 mg/dL — ABNORMAL LOW (ref >39)
TRIGLYCERIDES: 1074 mg/dL — AB (ref <150)

## 2013-06-09 LAB — BASIC METABOLIC PANEL WITH GFR
BUN: 13 mg/dL (ref 6–23)
CALCIUM: 9.6 mg/dL (ref 8.4–10.5)
CO2: 23 meq/L (ref 19–32)
Chloride: 105 mEq/L (ref 96–112)
Creat: 0.76 mg/dL (ref 0.50–1.35)
GFR, Est African American: >89 mL/min
GFR, Est Non African American: >89 mL/min
GLUCOSE: 102 mg/dL — AB (ref 70–99)
POTASSIUM: 4 meq/L (ref 3.5–5.3)
SODIUM: 138 meq/L (ref 135–145)

## 2013-06-09 LAB — INSULIN, FASTING: Insulin fasting, serum: 44 u[IU]/mL — ABNORMAL HIGH (ref 3–28)

## 2013-06-11 ENCOUNTER — Other Ambulatory Visit: Payer: Self-pay | Admitting: *Deleted

## 2013-06-11 MED ORDER — FENOFIBRATE MICRONIZED 134 MG PO CAPS: 134.0000 mg | ORAL_CAPSULE | Freq: Every day | ORAL | Status: DC

## 2013-06-21 ENCOUNTER — Telehealth: Payer: Self-pay | Admitting: *Deleted

## 2013-06-21 NOTE — Telephone Encounter (Signed)
Asking about samples of vimovo?  Said we were out the other Day & checking to see if we got any in?

## 2013-06-22 NOTE — Telephone Encounter (Signed)
Sorry I still do not have any in, ask if he wants a prescription sent in?

## 2013-06-22 NOTE — Telephone Encounter (Signed)
PT DECLINES A RX AT THIS TIME

## 2013-06-26 ENCOUNTER — Encounter: Payer: Self-pay | Admitting: Physician Assistant

## 2013-06-26 ENCOUNTER — Ambulatory Visit (INDEPENDENT_AMBULATORY_CARE_PROVIDER_SITE_OTHER): Payer: 59 | Admitting: Physician Assistant

## 2013-06-26 VITALS — BP 120/70 | HR 84 | Temp 98.1°F | Resp 16 | Wt 218.0 lb

## 2013-06-26 DIAGNOSIS — M542 Cervicalgia: Secondary | ICD-10-CM

## 2013-06-26 MED ORDER — PREDNISONE 20 MG PO TABS: ORAL_TABLET | ORAL | Status: DC

## 2013-06-26 MED ORDER — GABAPENTIN 300 MG PO CAPS: ORAL_CAPSULE | ORAL | Status: DC

## 2013-06-26 NOTE — Patient Instructions (Addendum)
Try the Gabapentin can start with 1 at bedtime for one week, then go to 2 at bedtime, then you can do one when you wake in addition to the others.   Cervical Strain and Sprain (Whiplash) with Rehab Cervical strain and sprains are injuries that commonly occur with "whiplash" injuries. Whiplash occurs when the neck is forcefully whipped backward or forward, such as during a motor vehicle accident. The muscles, ligaments, tendons, discs and nerves of the neck are susceptible to injury when this occurs. SYMPTOMS   Pain or stiffness in the front and/or back of neck  Symptoms may present immediately or up to 24 hours after injury.  Dizziness, headache, nausea and vomiting.  Muscle spasm with soreness and stiffness in the neck.  Tenderness and swelling at the injury site. CAUSES  Whiplash injuries often occur during contact sports or motor vehicle accidents.  RISK INCREASES WITH:  Osteoarthritis of the spine.  Situations that make head or neck accidents or trauma more likely.  High-risk sports (football, rugby, wrestling, hockey, auto racing, gymnastics, diving, contact karate or boxing).  Poor strength and flexibility of the neck.  Previous neck injury.  Poor tackling technique.  Improperly fitted or padded equipment. PREVENTION  Learn and use proper technique (avoid tackling with the head, spearing and head-butting; use proper falling techniques to avoid landing on the head).  Warm up and stretch properly before activity.  Maintain physical fitness:  Strength, flexibility and endurance.  Cardiovascular fitness.  Wear properly fitted and padded protective equipment, such as padded soft collars, for participation in contact sports. PROGNOSIS  Recovery for cervical strain and sprain injuries is dependent on the extent of the injury. These injuries are usually curable in 1 week to 3 months with appropriate treatment.  RELATED COMPLICATIONS   Temporary numbness and weakness  may occur if the nerve roots are damaged, and this may persist until the nerve has completely healed.  Chronic pain due to frequent recurrence of symptoms.  Prolonged healing, especially if activity is resumed too soon (before complete recovery). TREATMENT  Treatment initially involves the use of ice and medication to help reduce pain and inflammation. It is also important to perform strengthening and stretching exercises and modify activities that worsen symptoms so the injury does not get worse. These exercises may be performed at home or with a therapist. For patients who experience severe symptoms, a soft padded collar may be recommended to be worn around the neck.  Improving your posture may help reduce symptoms. Posture improvement includes pulling your chin and abdomen in while sitting or standing. If you are sitting, sit in a firm chair with your buttocks against the back of the chair. While sleeping, try replacing your pillow with a small towel rolled to 2 inches in diameter, or use a cervical pillow or soft cervical collar. Poor sleeping positions delay healing.  For patients with nerve root damage, which causes numbness or weakness, the use of a cervical traction apparatus may be recommended. Surgery is rarely necessary for these injuries. However, cervical strain and sprains that are present at birth (congenital) may require surgery. MEDICATION   If pain medication is necessary, nonsteroidal anti-inflammatory medications, such as aspirin and ibuprofen, or other minor pain relievers, such as acetaminophen, are often recommended.  Do not take pain medication for 7 days before surgery.  Prescription pain relievers may be given if deemed necessary by your caregiver. Use only as directed and only as much as you need. HEAT AND COLD:  Cold treatment (icing) relieves pain and reduces inflammation. Cold treatment should be applied for 10 to 15 minutes every 2 to 3 hours for inflammation and  pain and immediately after any activity that aggravates your symptoms. Use ice packs or an ice massage.  Heat treatment may be used prior to performing the stretching and strengthening activities prescribed by your caregiver, physical therapist, or athletic trainer. Use a heat pack or a warm soak. SEEK MEDICAL CARE IF:   Symptoms get worse or do not improve in 2 weeks despite treatment.  New, unexplained symptoms develop (drugs used in treatment may produce side effects). EXERCISES RANGE OF MOTION (ROM) AND STRETCHING EXERCISES - Cervical Strain and Sprain These exercises may help you when beginning to rehabilitate your injury. In order to successfully resolve your symptoms, you must improve your posture. These exercises are designed to help reduce the forward-head and rounded-shoulder posture which contributes to this condition. Your symptoms may resolve with or without further involvement from your physician, physical therapist or athletic trainer. While completing these exercises, remember:   Restoring tissue flexibility helps normal motion to return to the joints. This allows healthier, less painful movement and activity.  An effective stretch should be held for at least 20 seconds, although you may need to begin with shorter hold times for comfort.  A stretch should never be painful. You should only feel a gentle lengthening or release in the stretched tissue. STRETCH- Axial Extensors  Lie on your back on the floor. You may bend your knees for comfort. Place a rolled up hand towel or dish towel, about 2 inches in diameter, under the part of your head that makes contact with the floor.  Gently tuck your chin, as if trying to make a "double chin," until you feel a gentle stretch at the base of your head.  Hold __________ seconds. Repeat __________ times. Complete this exercise __________ times per day.  STRETECH - Axial Extension   Stand or sit on a firm surface. Assume a good posture:  chest up, shoulders drawn back, abdominal muscles slightly tense, knees unlocked (if standing) and feet hip width apart.  Slowly retract your chin so your head slides back and your chin slightly lowers.Continue to look straight ahead.  You should feel a gentle stretch in the back of your head. Be certain not to feel an aggressive stretch since this can cause headaches later.  Hold for __________ seconds. Repeat __________ times. Complete this exercise __________ times per day. STRETCH  Cervical Side Bend   Stand or sit on a firm surface. Assume a good posture: chest up, shoulders drawn back, abdominal muscles slightly tense, knees unlocked (if standing) and feet hip width apart.  Without letting your nose or shoulders move, slowly tip your right / left ear to your shoulder until your feel a gentle stretch in the muscles on the opposite side of your neck.  Hold __________ seconds. Repeat __________ times. Complete this exercise __________ times per day. STRETCH  Cervical Rotators   Stand or sit on a firm surface. Assume a good posture: chest up, shoulders drawn back, abdominal muscles slightly tense, knees unlocked (if standing) and feet hip width apart.  Keeping your eyes level with the ground, slowly turn your head until you feel a gentle stretch along the back and opposite side of your neck.  Hold __________ seconds. Repeat __________ times. Complete this exercise __________ times per day. RANGE OF MOTION - Neck Circles   Stand or sit on a  firm surface. Assume a good posture: chest up, shoulders drawn back, abdominal muscles slightly tense, knees unlocked (if standing) and feet hip width apart.  Gently roll your head down and around from the back of one shoulder to the back of the other. The motion should never be forced or painful.  Repeat the motion 10-20 times, or until you feel the neck muscles relax and loosen. Repeat __________ times. Complete the exercise __________ times per  day. STRENGTHENING EXERCISES - Cervical Strain and Sprain These exercises may help you when beginning to rehabilitate your injury. They may resolve your symptoms with or without further involvement from your physician, physical therapist or athletic trainer. While completing these exercises, remember:   Muscles can gain both the endurance and the strength needed for everyday activities through controlled exercises.  Complete these exercises as instructed by your physician, physical therapist or athletic trainer. Progress the resistance and repetitions only as guided.  You may experience muscle soreness or fatigue, but the pain or discomfort you are trying to eliminate should never worsen during these exercises. If this pain does worsen, stop and make certain you are following the directions exactly. If the pain is still present after adjustments, discontinue the exercise until you can discuss the trouble with your clinician. STRENGTH Cervical Flexors, Isometric  Face a wall, standing about 6 inches away. Place a small pillow, a ball about 6-8 inches in diameter, or a folded towel between your forehead and the wall.  Slightly tuck your chin and gently push your forehead into the soft object. Push only with mild to moderate intensity, building up tension gradually. Keep your jaw and forehead relaxed.  Hold 10 to 20 seconds. Keep your breathing relaxed.  Release the tension slowly. Relax your neck muscles completely before you start the next repetition. Repeat __________ times. Complete this exercise __________ times per day. STRENGTH- Cervical Lateral Flexors, Isometric   Stand about 6 inches away from a wall. Place a small pillow, a ball about 6-8 inches in diameter, or a folded towel between the side of your head and the wall.  Slightly tuck your chin and gently tilt your head into the soft object. Push only with mild to moderate intensity, building up tension gradually. Keep your jaw and  forehead relaxed.  Hold 10 to 20 seconds. Keep your breathing relaxed.  Release the tension slowly. Relax your neck muscles completely before you start the next repetition. Repeat __________ times. Complete this exercise __________ times per day. STRENGTH  Cervical Extensors, Isometric   Stand about 6 inches away from a wall. Place a small pillow, a ball about 6-8 inches in diameter, or a folded towel between the back of your head and the wall.  Slightly tuck your chin and gently tilt your head back into the soft object. Push only with mild to moderate intensity, building up tension gradually. Keep your jaw and forehead relaxed.  Hold 10 to 20 seconds. Keep your breathing relaxed.  Release the tension slowly. Relax your neck muscles completely before you start the next repetition. Repeat __________ times. Complete this exercise __________ times per day. POSTURE AND BODY MECHANICS CONSIDERATIONS - Cervical Strain and Sprain Keeping correct posture when sitting, standing or completing your activities will reduce the stress put on different body tissues, allowing injured tissues a chance to heal and limiting painful experiences. The following are general guidelines for improved posture. Your physician or physical therapist will provide you with any instructions specific to your needs. While reading these  guidelines, remember:  The exercises prescribed by your provider will help you have the flexibility and strength to maintain correct postures.  The correct posture provides the optimal environment for your joints to work. All of your joints have less wear and tear when properly supported by a spine with good posture. This means you will experience a healthier, less painful body.  Correct posture must be practiced with all of your activities, especially prolonged sitting and standing. Correct posture is as important when doing repetitive low-stress activities (typing) as it is when doing a single  heavy-load activity (lifting). PROLONGED STANDING WHILE SLIGHTLY LEANING FORWARD When completing a task that requires you to lean forward while standing in one place for a long time, place either foot up on a stationary 2-4 inch high object to help maintain the best posture. When both feet are on the ground, the low back tends to lose its slight inward curve. If this curve flattens (or becomes too large), then the back and your other joints will experience too much stress, fatigue more quickly and can cause pain.  RESTING POSITIONS Consider which positions are most painful for you when choosing a resting position. If you have pain with flexion-based activities (sitting, bending, stooping, squatting), choose a position that allows you to rest in a less flexed posture. You would want to avoid curling into a fetal position on your side. If your pain worsens with extension-based activities (prolonged standing, working overhead), avoid resting in an extended position such as sleeping on your stomach. Most people will find more comfort when they rest with their spine in a more neutral position, neither too rounded nor too arched. Lying on a non-sagging bed on your side with a pillow between your knees, or on your back with a pillow under your knees will often provide some relief. Keep in mind, being in any one position for a prolonged period of time, no matter how correct your posture, can still lead to stiffness. WALKING Walk with an upright posture. Your ears, shoulders and hips should all line-up. OFFICE WORK When working at a desk, create an environment that supports good, upright posture. Without extra support, muscles fatigue and lead to excessive strain on joints and other tissues. CHAIR:  A chair should be able to slide under your desk when your back makes contact with the back of the chair. This allows you to work closely.  The chair's height should allow your eyes to be level with the upper part of  your monitor and your hands to be slightly lower than your elbows.  Body position:  Your feet should make contact with the floor. If this is not possible, use a foot rest.  Keep your ears over your shoulders. This will reduce stress on your neck and low back. Document Released: 03/15/2005 Document Revised: 07/10/2012 Document Reviewed: 06/27/2008 [facility] Patient Information 2014 [place], [place].

## 2013-06-26 NOTE — Progress Notes (Signed)
   Subjective:    Patient ID: Jerry Stewart, male    DOB: [birth year], 58 y.o.   MRN: [id number]  HPI 58 y.o. AA, right handed, male with left shoulder pain for 2-4 weeks, no injury, he is a trimmer for molds for cars. Intermittent pain, left shoulder up neck, under shoulder blade achy and sharp at times. If he turns his head to the left it is worse, worse with abduction of his left shoulder, and worse when he is getting up from a lying to sitting position. Nothing helps. Non exertional, no accompaniments other than some nausea with severe pain. States he always has numbness and tingling in both hands, denies weakness in his arm.   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 The disc  contacts the cord without deforming it and uncovertebral disease  causes mild to moderate foraminal narrowing.   Review of Systems  HENT: Negative.   Respiratory: Negative.   Cardiovascular: Negative.   Gastrointestinal: Negative.   Genitourinary: Negative.   Musculoskeletal: Positive for arthralgias, neck pain and neck stiffness. Negative for back pain, gait problem, joint swelling and myalgias.  Neurological: Negative.        Objective:   Physical Exam  Constitutional: He is oriented to person, place, and time. He appears well-developed and well-nourished.  HENT:  Head: Normocephalic and atraumatic.  Right Ear: External ear normal.  Left Ear: External ear normal.  Mouth/Throat: Oropharynx is clear and moist.  Eyes: Conjunctivae and EOM are normal. Pupils are equal, round, and reactive to light.  Neck: Normal range of motion. Neck supple.  Cardiovascular: Normal rate, regular rhythm and normal heart sounds.   Pulmonary/Chest: Effort normal and breath sounds normal.  Abdominal: Soft. Bowel sounds are normal.  Musculoskeletal: Normal range of motion.  Pain with neck flexion and rotation to  the left, good reflexes and pulses in the left arm, good grips, some pain with abduction.   Neurological: He is alert and oriented to person, place, and time. No cranial nerve deficit.  Skin: Skin is warm and dry.  Psychiatric: He has a normal mood and affect. His behavior is normal.       Assessment & Plan:  Neck pain on left side - Plan: predniSONE (DELTASONE) 20 MG tablet, gabapentin (NEURONTIN) 300 MG capsule  May need to follow up with ortho Very atypical chest pain

## 2013-07-13 ENCOUNTER — Ambulatory Visit (INDEPENDENT_AMBULATORY_CARE_PROVIDER_SITE_OTHER): Payer: 59 | Admitting: Internal Medicine

## 2013-07-13 ENCOUNTER — Encounter: Payer: Self-pay | Admitting: Internal Medicine

## 2013-07-13 VITALS — BP 124/76 | HR 80 | Temp 98.1°F | Resp 18 | Ht 72.0 in | Wt 221.0 lb

## 2013-07-13 DIAGNOSIS — Z79899 Other long term (current) drug therapy: Secondary | ICD-10-CM

## 2013-07-13 DIAGNOSIS — I10 Essential (primary) hypertension: Secondary | ICD-10-CM | POA: Insufficient documentation

## 2013-07-13 DIAGNOSIS — E8881 Metabolic syndrome: Secondary | ICD-10-CM

## 2013-07-13 DIAGNOSIS — E785 Hyperlipidemia, unspecified: Secondary | ICD-10-CM

## 2013-07-13 DIAGNOSIS — B370 Candidal stomatitis: Secondary | ICD-10-CM

## 2013-07-13 DIAGNOSIS — E663 Overweight: Secondary | ICD-10-CM

## 2013-07-13 DIAGNOSIS — R490 Dysphonia: Secondary | ICD-10-CM

## 2013-07-13 MED ORDER — FLUCONAZOLE 150 MG PO TABS: ORAL_TABLET | ORAL | Status: DC

## 2013-07-13 NOTE — Patient Instructions (Signed)
Thrush, Adult  Thrush, also called oral candidiasis, is a fungal infection that develops in the mouth and throat and on the tongue. It causes white patches to form on the mouth and tongue. Thrush is most common in older adults, but it can occur at any age.  Many cases of thrush are mild, but this infection can also be more serious. Thrush can be a recurring problem for people who have chronic illnesses or who take medicines that limit the body's ability to fight infection. Because these people have difficulty fighting infections, the fungus that causes thrush can spread throughout the body. This can cause life-threatening blood or organ infections. CAUSES  Thrush is usually caused by a yeast called Candida albicans. This fungus is normally present in small amounts in the mouth and on other mucous membranes. It usually causes no harm. However, when conditions are present that allow the fungus to grow uncontrolled, it invades surrounding tissues and becomes an infection. Less often, other Candida species can also lead to thrush.  RISK FACTORS Thrush is more likely to develop in the following people:  People with an impaired ability to fight infection (weakened immune system).   Older adults.   People with HIV.   People with diabetes.   People with dry mouth (xerostomia).   Pregnant women.   People with poor dental care, especially those who have false teeth.   People who use antibiotic medicines.  SIGNS AND SYMPTOMS  Thrush can be a mild infection that causes no symptoms. If symptoms develop, they may include:   A burning feeling in the mouth and throat. This can occur at the start of a thrush infection.   White patches that adhere to the mouth and tongue. The tissue around the patches may be red, raw, and painful. If rubbed (during tooth brushing, for example), the patches and the tissue of the mouth may bleed easily.   A bad taste in the mouth or difficulty tasting foods.    Cottony feeling in the mouth.   Pain during eating and swallowing. DIAGNOSIS  Your health care provider can usually diagnose thrush by looking in your mouth and asking you questions about your health.  TREATMENT  Medicines that help prevent the growth of fungi (antifungals) are the standard treatment for thrush. These medicines are either applied directly to the affected area (topical) or swallowed (oral). The treatment will depend on the severity of the condition.  Mild Thrush Mild cases of thrush may clear up with the use of an antifungal mouth rinse or lozenges. Treatment usually lasts about 14 days.  Moderate to Severe Thrush  More severe thrush infections that have spread to the esophagus are treated with an oral antifungal medicine. A topical antifungal medicine may also be used.   For some severe infections, a treatment period longer than 14 days may be needed.   Oral antifungal medicines are almost never used during pregnancy because the fetus may be harmed. However, if a pregnant woman has a rare, severe thrush infection that has spread to her blood, oral antifungal medicines may be used. In this case, the risk of harm to the mother and fetus from the severe thrush infection may be greater than the risk posed by the use of antifungal medicines.  Persistent or Recurrent Thrush For cases of thrush that do not go away or keep coming back, treatment may involve the following:   Treatment may be needed twice as long as the symptoms last.   Treatment will   include both oral and topical antifungal medicines.   People with weakened immune systems can take an antifungal medicine on a continuous basis to prevent thrush infections.  It is important to treat conditions that make you more likely to get thrush, such as diabetes or HIV.  HOME CARE INSTRUCTIONS   Only take over-the-counter or prescription medicine as directed by your health care provider. Talk to your health care  provider about an over-the-counter medicine called gentian violet, which kills bacteria and fungi.   Eat plain, unflavored yogurt as directed by your health care provider. Check the label to make sure the yogurt contains live cultures. This yogurt can help healthy bacteria grow in the mouth that can stop the growth of the fungus that causes thrush.   Try these measures to help reduce the discomfort of thrush:   Drink cold liquids such as water or iced tea.   Try flavored ice treats or frozen juices.   Eat foods that are easy to swallow, such as gelatin, ice cream, or custard.   If the patches in your mouth are painful, try drinking from a straw.   Rinse your mouth several times a day with a warm saltwater rinse. You can make the saltwater mixture with 1 tsp (6 g) of salt in 8 fl oz (0.2 L) of warm water.   If you wear dentures, remove the dentures before going to bed, brush them vigorously, and soak them in a cleaning solution as directed by your health care provider.   Women who are breastfeeding should clean their nipples with an antifungal medicine as directed by their health care provider. Dry the nipples after breastfeeding. Applying lanolin-containing body lotion may help relieve nipple soreness.  SEEK MEDICAL CARE IF:  Your symptoms are getting worse or are not improving within 7 days of starting treatment.   You have symptoms of spreading infection, such as white patches on the skin outside of the mouth.   You are nursing and you have redness, burning, or pain in the nipples that is not relieved with treatment.  MAKE SURE YOU:  Understand these instructions.  Will watch your condition.  Will get help right away if you are not doing well or get worse. Document Released: 12/09/2003 Document Revised: 01/03/2013 Document Reviewed: 10/16/2012 [employer] Patient Information 2014 [employer].  

## 2013-07-13 NOTE — Progress Notes (Signed)
Subjective:    Patient ID: Jerry Stewart, male    DOB: 1955-08-25, 58 y.o.   MRN: [id number]  Sore Throat  This is a new problem. The current episode started in the past 7 days. The problem has been waxing and waning. There has been no fever. Associated symptoms include congestion, coughing and a hoarse voice. Pertinent negatives include no abdominal pain, diarrhea, drooling, ear discharge, ear pain, headaches, plugged ear sensation, neck pain, shortness of breath, stridor, swollen glands, trouble swallowing or vomiting. He has had no exposure to strep or mono. He has tried nothing for the symptoms. The treatment provided no relief.    Patient had recently been treated with prednisone  X 11 days and now off about a week. He's c/o white patches in his mouth and on his tongue. Current Outpatient Prescriptions on File Prior to Visit  Medication Sig Dispense Refill  . fenofibrate micronized (LOFIBRA) 134 MG capsule Take 1 capsule (134 mg total) by mouth daily before breakfast.  30 capsule  3  . gabapentin (NEURONTIN) 300 MG capsule Can take 1 pill with breakfast, 1 pill at lunch and 2 pills at dinner for pain as needed.  120 capsule  2  . metFORMIN (GLUCOPHAGE) 500 MG tablet Take 500 mg by mouth 3 (three) times daily.       No current facility-administered medications on file prior to visit.   Allergies  Allergen Reactions  . Chantix [Varenicline]     Nausea  . Lopid [Gemfibrozil]     Nausea/weakness   Past Medical History  Diagnosis Date  . Sarcoid   . Hepatitis C   . Diabetes mellitus without complication   . Hypercholesteremia   . Tobacco abuse   . Hiatal hernia   . Marijuana use   . Chest pain 05/16/2012  . Thrombocytopenia   . Osteoarthritis   . Colon polyps   . DDD (degenerative disc disease)     Review of Systems  Constitutional: Negative.   HENT: Positive for congestion, hoarse voice and mouth sores. Negative for drooling, ear discharge, ear pain, hearing loss,  nosebleeds, postnasal drip, rhinorrhea, sinus pressure and trouble swallowing.   Eyes: Negative.   Respiratory: Positive for cough. Negative for shortness of breath and stridor.   Cardiovascular: Negative.   Gastrointestinal: Negative for vomiting, abdominal pain and diarrhea.  Musculoskeletal: Negative for neck pain.  Neurological: Negative for headaches.      BP 124/76  Pulse 80  Temp(Src) 98.1 F (36.7 C) (Temporal)  Resp 18  Ht 6' (1.829 m)  Wt 221 lb (100.245 kg)  BMI 29.97 kg/m2  Objective:   Physical Exam  Constitutional: He appears well-developed and well-nourished. No distress.  HENT:  Mouth/Throat: Oropharyngeal exudate present.  Few scattered white patches on the tongue an buccal mucosa. Voice is hoarse   Eyes: EOM are normal. Pupils are equal, round, and reactive to light. Right eye exhibits no discharge. Left eye exhibits discharge. No scleral icterus.  Neck: Normal range of motion. Neck supple. No JVD present. No thyromegaly present.  Cardiovascular: Normal rate, regular rhythm and normal heart sounds.   No murmur heard. Pulmonary/Chest: Breath sounds normal. No stridor. No respiratory distress. He has no wheezes. He has no rales. He exhibits no tenderness.  Lymphadenopathy:    He has no cervical adenopathy.  Skin: He is not diaphoretic.    Assessment & Plan:   1. Candidiasis of mouth - Diflucan 150 mg # 15 - 1 tab qod - Magic Mouthwash  w/HC   2. Hoarseness

## 2013-07-16 ENCOUNTER — Other Ambulatory Visit: Payer: Self-pay | Admitting: Emergency Medicine

## 2013-08-13 ENCOUNTER — Other Ambulatory Visit: Payer: Self-pay | Admitting: *Deleted

## 2013-08-13 MED ORDER — FENOFIBRATE MICRONIZED 134 MG PO CAPS: 134.0000 mg | ORAL_CAPSULE | Freq: Every day | ORAL | Status: DC

## 2013-08-14 ENCOUNTER — Other Ambulatory Visit: Payer: Self-pay

## 2013-08-14 DIAGNOSIS — M542 Cervicalgia: Secondary | ICD-10-CM

## 2013-08-14 MED ORDER — GABAPENTIN 300 MG PO CAPS: ORAL_CAPSULE | ORAL | Status: DC

## 2013-10-03 ENCOUNTER — Telehealth: Payer: Self-pay | Admitting: *Deleted

## 2013-10-03 ENCOUNTER — Other Ambulatory Visit: Payer: Self-pay | Admitting: Emergency Medicine

## 2013-10-03 MED ORDER — FENOFIBRATE MICRONIZED 134 MG PO CAPS: 134.0000 mg | ORAL_CAPSULE | Freq: Every day | ORAL | Status: DC

## 2013-10-03 NOTE — Telephone Encounter (Signed)
REFILL = FENOFIBRATE NEEDS US TO CALL IN A 90DAY RX CURRENT RX IS ONLY A 30 DAY RX

## 2013-10-10 ENCOUNTER — Encounter: Payer: Self-pay | Admitting: Gastroenterology

## 2013-10-12 ENCOUNTER — Encounter: Payer: Self-pay | Admitting: Emergency Medicine

## 2013-10-12 ENCOUNTER — Ambulatory Visit (INDEPENDENT_AMBULATORY_CARE_PROVIDER_SITE_OTHER): Payer: 59 | Admitting: Emergency Medicine

## 2013-10-12 VITALS — BP 130/70 | HR 78 | Temp 98.2°F | Resp 18 | Ht 72.0 in | Wt 221.0 lb

## 2013-10-12 DIAGNOSIS — B182 Chronic viral hepatitis C: Secondary | ICD-10-CM

## 2013-10-12 DIAGNOSIS — R030 Elevated blood-pressure reading, without diagnosis of hypertension: Secondary | ICD-10-CM

## 2013-10-12 DIAGNOSIS — E119 Type 2 diabetes mellitus without complications: Secondary | ICD-10-CM

## 2013-10-12 DIAGNOSIS — E559 Vitamin D deficiency, unspecified: Secondary | ICD-10-CM

## 2013-10-12 DIAGNOSIS — Z79899 Other long term (current) drug therapy: Secondary | ICD-10-CM

## 2013-10-12 DIAGNOSIS — R202 Paresthesia of skin: Secondary | ICD-10-CM

## 2013-10-12 DIAGNOSIS — R209 Unspecified disturbances of skin sensation: Secondary | ICD-10-CM

## 2013-10-12 DIAGNOSIS — E782 Mixed hyperlipidemia: Secondary | ICD-10-CM

## 2013-10-12 LAB — CBC WITH DIFFERENTIAL/PLATELET
BASOS PCT: 1 % (ref 0–1)
Basophils Absolute: 0.1 10*3/uL (ref 0.0–0.1)
EOS ABS: 0.1 10*3/uL (ref 0.0–0.7)
Eosinophils Relative: 2 % (ref 0–5)
HEMATOCRIT: 43.9 % (ref 39.0–52.0)
HEMOGLOBIN: 15.5 g/dL (ref 13.0–17.0)
Lymphocytes Relative: 27 % (ref 12–46)
Lymphs Abs: 2 10*3/uL (ref 0.7–4.0)
MCH: 29.5 pg (ref 26.0–34.0)
MCHC: 35.3 g/dL (ref 30.0–36.0)
MCV: 83.5 fL (ref 78.0–100.0)
MONO ABS: 0.6 10*3/uL (ref 0.1–1.0)
MONOS PCT: 8 % (ref 3–12)
Neutro Abs: 4.6 10*3/uL (ref 1.7–7.7)
Neutrophils Relative %: 62 % (ref 43–77)
Platelets: 239 10*3/uL (ref 150–400)
RBC: 5.26 MIL/uL (ref 4.22–5.81)
RDW: 13 % (ref 11.5–15.5)
WBC: 7.4 10*3/uL (ref 4.0–10.5)

## 2013-10-12 LAB — HEPATIC FUNCTION PANEL
ALT: 34 U/L (ref 0–53)
AST: 29 U/L (ref 0–37)
Albumin: 4 g/dL (ref 3.5–5.2)
Alkaline Phosphatase: 80 U/L (ref 39–117)
BILIRUBIN INDIRECT: 0.6 mg/dL (ref 0.2–1.2)
Bilirubin, Direct: 0.2 mg/dL (ref 0.0–0.3)
TOTAL PROTEIN: 6.8 g/dL (ref 6.0–8.3)
Total Bilirubin: 0.8 mg/dL (ref 0.2–1.2)

## 2013-10-12 LAB — IRON AND TIBC
%SAT: 17 % — ABNORMAL LOW (ref 20–55)
IRON: 70 ug/dL (ref 42–165)
TIBC: 409 ug/dL (ref 215–435)
UIBC: 339 ug/dL (ref 125–400)

## 2013-10-12 LAB — BASIC METABOLIC PANEL WITH GFR
BUN: 12 mg/dL (ref 6–23)
CO2: 25 meq/L (ref 19–32)
CREATININE: 0.77 mg/dL (ref 0.50–1.35)
Calcium: 9 mg/dL (ref 8.4–10.5)
Chloride: 106 mEq/L (ref 96–112)
GFR, Est African American: >89 mL/min
GFR, Est Non African American: >89 mL/min
GLUCOSE: 95 mg/dL (ref 70–99)
Potassium: 4 mEq/L (ref 3.5–5.3)
Sodium: 139 mEq/L (ref 135–145)

## 2013-10-12 LAB — LIPID PANEL
Cholesterol: 123 mg/dL (ref 0–200)
HDL: 22 mg/dL — AB (ref >39)
Total CHOL/HDL Ratio: 5.6 Ratio
Triglycerides: 639 mg/dL — ABNORMAL HIGH (ref <150)

## 2013-10-12 LAB — VITAMIN B12: Vitamin B-12: 458 pg/mL (ref 211–911)

## 2013-10-12 LAB — HEMOGLOBIN A1C
HEMOGLOBIN A1C: 5.7 % — AB (ref <5.7)
Mean Plasma Glucose: 117 mg/dL — ABNORMAL HIGH (ref <117)

## 2013-10-12 LAB — VITAMIN D 25 HYDROXY (VIT D DEFICIENCY, FRACTURES): Vit D, 25-Hydroxy: 46 ng/mL (ref 30–89)

## 2013-10-12 LAB — TSH: TSH: 2.261 u[IU]/mL (ref 0.350–4.500)

## 2013-10-12 LAB — MAGNESIUM: Magnesium: 2 mg/dL (ref 1.5–2.5)

## 2013-10-12 NOTE — Patient Instructions (Signed)
Hepatitis C  Hepatitis C is a viral infection of the liver. Infection may go undetected for months or years because symptoms may be absent or very mild. Chronic liver disease is the main danger of hepatitis C. This may lead to scarring of the liver (cirrhosis), liver failure, and liver cancer.  CAUSES   Hepatitis C is caused by the hepatitis C virus (HCV). Formerly, hepatitis C infections were most commonly transmitted through blood transfusions. In the early 1990s, routine testing of donated blood for hepatitis C and exclusion of blood that tests positive for HCV began. Now, HCV is most commonly transmitted from person to person through injection drug use, sharing needles, or sex with an infected person. A caregiver may also get the infection from exposure to the blood of an infected patient by way of a cut or needle stick.   SYMPTOMS   Acute Phase  Many cases of acute HCV infection are mild and cause few problems. Some people may not even realize they are sick. Symptoms in others may last a few weeks to several months and include:  · Feeling very tired.  · Loss of appetite.  · Nausea.  · Vomiting.  · Abdominal pain.  · Dark yellow urine.  · Yellow skin and eyes (jaundice).  · Itching of the skin.  Chronic Phase  · Between 50% to 85% of people who get HCV infection become "chronic carriers." They often have no symptoms, but the virus stays in their body. They may spread the virus to others and can get long-term liver disease.  · Many people with chronic HCV infection remain healthy for many years. However, up to 1 in 5 chronically infected people may develop severe liver diseases including scarring of the liver (cirrhosis), liver failure, or liver cancer.  DIAGNOSIS   Diagnosis of hepatitis C infection is made by testing blood for the presence of hepatitis C viral particles called RNA. Other tests may also be done to measure the status of current liver function, exclude other liver problems, or assess liver  damage.  TREATMENT   Treatment with many antiviral drugs is available and recommended for some patients with chronic HCV infection. Drug treatment is generally considered appropriate for patients who:  · Are 18 years of age or older.  · Have a positive test for HCV particles in the blood.  · Have a liver tissue sample (biopsy) that shows chronic hepatitis and significant scarring (fibrosis).  · Do not have signs of liver failure.  · Have acceptable blood test results that confirm the wellness of other body organs.  · Are willing to be treated and conform to treatment requirements.  · Have no other circumstances that would prevent treatment from being recommended (contraindications).  All people who are offered and choose to receive drug treatment must understand that careful medical follow up for many months and even years is crucial in order to make successful care possible. The goal of drug treatment is to eliminate any evidence of HCV in the blood on a long-term basis. This is called a "sustained virologic response" or SVR. Achieving a SVR is associated with a decrease in the chance of life-threatening liver problems, need for a liver transplant, liver cancer rates, and liver-related complications.  Successful treatment currently requires taking treatment drugs for at least 24 weeks and up to 72 weeks. An injected drug (interferon) given weekly and an oral antiviral medicine taken daily are usually prescribed. Side effects from these drugs are common and some may be very   serious. Your response to treatment must be carefully monitored by both you and your caregiver throughout the entire treatment period.  PREVENTION  There is no vaccine for hepatitis C. The only way to prevent the disease is to reduce the risk of exposure to the virus.   · Avoid sharing drug needles or personal items like toothbrushes, razors, and nail clippers with an infected person.  · Healthcare workers need to avoid injuries and wear  appropriate protective equipment such as gloves, gowns, and face masks when performing invasive medical or nursing procedures.  HOME CARE INSTRUCTIONS   To avoid making your liver disease worse:  · Strictly avoid drinking alcohol.  · Carefully review all new prescriptions of medicines with your caregiver. Ask your caregiver which drugs you should avoid. The following drugs are toxic to the liver, and your caregiver may tell you to avoid them:  ¨ Isoniazid.  ¨ Methyldopa.  ¨ Acetaminophen.  ¨ Anabolic steroids (muscle-building drugs).  ¨ Erythromycin.  ¨ Oral contraceptives (birth control pills).  · Check with your caregiver to make sure medicine you are currently taking will not be harmful.  · Periodic blood tests may be required. Follow your caregiver's advice about when you should have blood tests.  · Avoid a sexual relationship until advised otherwise by your caregiver.  · Avoid activities that could expose other people to your blood. Examples include sharing a toothbrush, nail clippers, razors, and needles.  · Bed rest is not necessary, but it may make you feel better. Recovery time is not related to the amount of rest you receive.  · This infection is contagious. Follow your caregiver's instructions in order to avoid spread of the infection.  SEEK IMMEDIATE MEDICAL CARE IF:  · You have increasing fatigue or weakness.  · You have an oral temperature above 102° F (38.9° C), not controlled by medicine.  · You develop loss of appetite, nausea, or vomiting.  · You develop jaundice.  · You develop easy bruising or bleeding.  · You develop any severe problems as a result of your treatment.  MAKE SURE YOU:   · Understand these instructions.  · Will watch your condition.  · Will get help right away if you are not doing well or get worse.  Document Released: 03/12/2000 Document Revised: 06/07/2011 Document Reviewed: 07/15/2010  ExitCare® Patient Information ©2015 [employer]. This information is not intended to replace  advice given to you by your health care provider. Make sure you discuss any questions you have with your health care provider.

## 2013-10-12 NOTE — Progress Notes (Signed)
Subjective:    Patient ID: Jerry Stewart, male    DOB: [birth year], 58 y.o.   MRN: [id number]  HPI Comments: 58 yo AAM presents for 3 month F/U for elevated BP w/o HTN, Cholesterol, DM, D. Deficient. 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 He notes he has broken sleep due to trying to see family during the day.  He was out of cholesterol RX x 1 week but restarted TUES. He has been trying to eat better. He is not exercising, except with work.  He was diagnosed with HEP C 09/2012 but refused Further evaluation. He has had some numbness in both hands on/off and back right leg. He denies injury. He uses hands a lot for work but denies strain. He denies triggers for hands/ leg  and notes resolves without treatment.  WBC             6.2   06/08/2013 HGB            16.3   06/08/2013 HCT            46.2   06/08/2013 PLT             221   06/08/2013 GLUCOSE         102   06/08/2013 CHOL            135   06/08/2013 TRIG           1074   06/08/2013 HDL              18   06/08/2013 LDLCALC      NOT CALC   06/08/2013 ALT              32   06/08/2013 AST              32   06/08/2013 NA              138   06/08/2013 K               4.0   06/08/2013 CL              105   06/08/2013 CREATININE     0.76   06/08/2013 BUN              13   06/08/2013 CO2              23   06/08/2013 INR            1.00   05/17/2012 HGBA1C          5.0   06/08/2013       Medication List       This list is accurate as of: 10/12/13  9:36 AM.  Always use your most recent med list.               fenofibrate micronized 134 MG capsule  Commonly known as:  LOFIBRA  Take 1 capsule (134 mg total) by mouth daily before breakfast.     gabapentin 300 MG capsule  Commonly known as:  NEURONTIN  Can take 1 pill with breakfast, 1 pill at lunch and 2 pills at dinner for pain as needed.     metFORMIN 500 MG tablet  Commonly known as:  GLUCOPHAGE  TAKE 1 TABLET THREE TIMES A DAY       Allergies  Allergen Reactions  . Chantix  [Varenicline]     Nausea  . Lopid [Gemfibrozil]  Nausea/weakness   Past Medical History  Diagnosis Date  . Sarcoid   . Hepatitis C   . Diabetes mellitus without complication   . Hypercholesteremia   . Tobacco abuse   . Hiatal hernia   . Marijuana use   . Chest pain 05/16/2012  . Thrombocytopenia   . Osteoarthritis   . Colon polyps   . DDD (degenerative disc disease)      Review of Systems  Constitutional: Positive for fatigue.  Neurological: Positive for numbness.  All other systems reviewed and are negative.  BP 130/70  Pulse 78  Temp(Src) 98.2 F (36.8 C) (Temporal)  Resp 18  Ht 6' (1.829 m)  Wt 221 lb (100.245 kg)  BMI 29.97 kg/m2     Objective:   Physical Exam  Nursing note and vitals reviewed. Constitutional: He is oriented to person, place, and time. He appears well-developed and well-nourished.  HENT:  Head: Normocephalic and atraumatic.  Right Ear: External ear normal.  Left Ear: External ear normal.  Nose: Nose normal.  Eyes: Conjunctivae and EOM are normal.  Neck: Normal range of motion. Neck supple. No JVD present. No thyromegaly present.  Cardiovascular: Normal rate, regular rhythm, normal heart sounds and intact distal pulses.   Pulmonary/Chest: Effort normal and breath sounds normal.  Abdominal: Soft. Bowel sounds are normal. He exhibits no distension and no mass. There is no tenderness. There is no rebound and no guarding.  Musculoskeletal: Normal range of motion. He exhibits no edema and no tenderness.  Lymphadenopathy:    He has no cervical adenopathy.  Neurological: He is alert and oriented to person, place, and time. He has normal reflexes. No cranial nerve deficit. Coordination normal.  NORMAL EXAM  Skin: Skin is warm and dry.  Psychiatric: He has a normal mood and affect. His behavior is normal. Judgment and thought content normal.          Assessment & Plan:  1.  3 month F/U for elevated BP w/o  HTN, Cholesterol,DM, D.  Deficient. Needs healthy diet, cardio QD and obtain healthy weight. Check Labs, Check BP if >130/80 call office, Check BS if >200 call office   2. Fatigue vs sleep apnea vs shift work- check labs, increase activity and H2O, may need sleep study, sleep hygiene discussed  3. HEP C- Recheck lab with previous refusal for referral for treatment  4. ? Hand/ leg numbness- if labs NEG ref NEurology vs Hand specialist

## 2013-10-13 LAB — FOLATE RBC: RBC FOLATE: 261 ng/mL — AB (ref >280)

## 2013-10-13 LAB — INSULIN, FASTING: Insulin fasting, serum: 24 u[IU]/mL (ref 3–28)

## 2013-10-20 LAB — HEPATITIS C RNA QUANTITATIVE
HCV QUANT: 3597622 [IU]/mL — AB (ref <15)
HCV Quantitative Log: 6.56 {Log} — ABNORMAL HIGH (ref <1.18)

## 2013-10-22 LAB — HEPATITIS C GENOTYPE

## 2013-10-23 ENCOUNTER — Encounter: Payer: Self-pay | Admitting: Gastroenterology

## 2013-10-25 ENCOUNTER — Other Ambulatory Visit: Payer: Self-pay | Admitting: Physician Assistant

## 2013-10-25 DIAGNOSIS — B182 Chronic viral hepatitis C: Secondary | ICD-10-CM

## 2013-12-14 ENCOUNTER — Ambulatory Visit (AMBULATORY_SURGERY_CENTER): Payer: Managed Care, Other (non HMO)

## 2013-12-14 VITALS — Ht 72.0 in | Wt 226.0 lb

## 2013-12-14 DIAGNOSIS — Z860100 Personal history of colon polyps, unspecified: Secondary | ICD-10-CM

## 2013-12-14 MED ORDER — MOVIPREP 100 G PO SOLR: 1.0000 | Freq: Once | ORAL | Status: DC

## 2013-12-14 NOTE — Progress Notes (Signed)
No allergies to eggs or soy No past problems with anesthesia No home oxygen No diet/weight loss meds  No email  Emmi instructions given for colonoscopy 

## 2013-12-19 ENCOUNTER — Other Ambulatory Visit: Payer: Self-pay | Admitting: Emergency Medicine

## 2013-12-25 ENCOUNTER — Encounter: Payer: Self-pay | Admitting: Gastroenterology

## 2014-01-04 ENCOUNTER — Encounter: Payer: Self-pay | Admitting: Gastroenterology

## 2014-01-04 ENCOUNTER — Ambulatory Visit (AMBULATORY_SURGERY_CENTER): Payer: Managed Care, Other (non HMO) | Admitting: Gastroenterology

## 2014-01-04 VITALS — BP 122/79 | HR 78 | Temp 97.0°F | Resp 29 | Ht 72.0 in | Wt 226.0 lb

## 2014-01-04 DIAGNOSIS — Z86010 Personal history of colonic polyps: Secondary | ICD-10-CM

## 2014-01-04 LAB — GLUCOSE, CAPILLARY
Glucose-Capillary: 197 mg/dL — ABNORMAL HIGH (ref 70–99)
Glucose-Capillary: 151 mg/dL — ABNORMAL HIGH (ref 70–99)

## 2014-01-04 MED ORDER — SODIUM CHLORIDE 0.9 % IV SOLN: 500.0000 mL | INTRAVENOUS | Status: DC

## 2014-01-04 NOTE — Op Note (Addendum)
 [facility] [address] [place], [id number]   COLONOSCOPY PROCEDURE REPORT  PATIENT: Jerry Stewart, [name]  MR#: [id number] BIRTHDATE: [birth year] , 58  yrs. old GENDER: male ENDOSCOPIST: Rachael Feeaniel P Jacobs, MD PROCEDURE DATE:  01/04/2014 PROCEDURE:   Colonoscopy, surveillance First Screening Colonoscopy - Avg.  risk and is 50 yrs.  old or older - No.  Prior Negative Screening - Now for repeat screening. N/A  History of Adenoma - Now for follow-up colonoscopy & has been > or = to 3 yrs.  Yes hx of adenoma.  Has been 3 or more years since last colonoscopy.  Polyps Removed Today? No.  Recommend repeat exam, <10 yrs? No. ASA CLASS:   Class II INDICATIONS:5 polyps 2010, one was a subcentimeter adenoma (the rest were hyperplastic). MEDICATIONS: Monitored anesthesia care and Propofol 260 mg IV  DESCRIPTION OF PROCEDURE:   After the risks benefits and alternatives of the procedure were thoroughly explained, informed consent was obtained.  The digital rectal exam revealed no abnormalities of the rectum.   The LB [id number]  endoscope was introduced through the anus and advanced to the cecum, which was identified by both the appendix and ileocecal valve. No adverse events experienced.   The quality of the prep was excellent.  The instrument was then slowly withdrawn as the colon was fully examined.   COLON FINDINGS: A normal appearing cecum, ileocecal valve, and appendiceal orifice were identified.  The ascending, transverse, descending, sigmoid colon, and rectum appeared unremarkable. Retroflexed views revealed no abnormalities. The time to cecum=2 minutes 05 seconds.  Withdrawal time=10 minutes 43 seconds.  The scope was withdrawn and the procedure completed. COMPLICATIONS: There were no immediate complications.  ENDOSCOPIC IMPRESSION: Normal colonoscopy No polyps or cancers  RECOMMENDATIONS: You should continue to follow colorectal cancer screening guidelines for  "routine risk" patients with a repeat colonoscopy in 10 years.   eSigned:  Rachael Feeaniel P Jacobs, MD 01/04/2014 8:57 AM Revised: 01/04/2014 8:57 AM  cc: Lucky CowboyWilliam McKeown, MD

## 2014-01-04 NOTE — Progress Notes (Signed)
Report to PACU, RN, vss, BBS= Clear.  

## 2014-01-04 NOTE — Patient Instructions (Addendum)
One of your biggest health concerns is your smoking.  This increases your risk for most cancers and serious cardiovascular diseases such as strokes, heart attacks.  You should try your best to stop.  If you need assistance, please contact your PCP or Smoking Cessation Class at [facility] [phone]) or [facility] Quit[facility]Line (1-800-QUIT-NOW).  Discharge instructions given with verbal understanding. Normal exam. Resume previous medications. YOU HAD AN ENDOSCOPIC PROCEDURE TODAY AT THE [facility]: Refer to the procedure report that was given to you for any specific questions about what was found during the examination.  If the procedure report does not answer your questions, please call your gastroenterologist to clarify.  If you requested that your care partner not be given the details of your procedure findings, then the procedure report has been included in a sealed envelope for you to review at your convenience later.  YOU SHOULD EXPECT: Some feelings of bloating in the abdomen. Passage of more gas than usual.  Walking can help get rid of the air that was put into your GI tract during the procedure and reduce the bloating. If you had a lower endoscopy (such as a colonoscopy or flexible sigmoidoscopy) you may notice spotting of blood in your stool or on the toilet paper. If you underwent a bowel prep for your procedure, then you may not have a normal bowel movement for a few days.  DIET: Your first meal following the procedure should be a light meal and then it is ok to progress to your normal diet.  A half-sandwich or bowl of soup is an example of a good first meal.  Heavy or fried foods are harder to digest and may make you feel nauseous or bloated.  Likewise meals heavy in dairy and vegetables can cause extra gas to form and this can also increase the bloating.  Drink plenty of fluids but you should avoid alcoholic beverages for 24 hours.  ACTIVITY: Your care partner should take you  home directly after the procedure.  You should plan to take it easy, moving slowly for the rest of the day.  You can resume normal activity the day after the procedure however you should NOT DRIVE or use heavy machinery for 24 hours (because of the sedation medicines used during the test).    SYMPTOMS TO REPORT IMMEDIATELY: A gastroenterologist can be reached at any hour.  During normal business hours, 8:30 AM to 5:00 PM Monday through Friday, call [phone].  After hours and on weekends, please call the GI answering service at [phone] who will take a message and have the physician on call contact you.   Following lower endoscopy (colonoscopy or flexible sigmoidoscopy):  Excessive amounts of blood in the stool  Significant tenderness or worsening of abdominal pains  Swelling of the abdomen that is new, acute  Fever of 100F or higher  FOLLOW UP: If any biopsies were taken you will be contacted by phone or by letter within the next 1-3 weeks.  Call your gastroenterologist if you have not heard about the biopsies in 3 weeks.  Our staff will call the home number listed on your records the next business day following your procedure to check on you and address any questions or concerns that you may have at that time regarding the information given to you following your procedure. This is a courtesy call and so if there is no answer at the home number and we have not heard from you through the emergency  physician on call, we will assume that you have returned to your regular daily activities without incident.  SIGNATURES/CONFIDENTIALITY: You and/or your care partner have signed paperwork which will be entered into your electronic medical record.  These signatures attest to the fact that that the information above on your After Visit Summary has been reviewed and is understood.  Full responsibility of the confidentiality of this discharge information lies with you and/or your care-partner.

## 2014-01-07 ENCOUNTER — Telehealth: Payer: Self-pay | Admitting: *Deleted

## 2014-01-07 NOTE — Telephone Encounter (Signed)
  Follow up Call-  Call back number 01/04/2014  Post procedure Call Back phone  # [phone]  Permission to leave phone message Yes     Patient questions:  Do you have a fever, pain , or abdominal swelling? No. Pain Score  0 *  Have you tolerated food without any problems? Yes.    Have you been able to return to your normal activities? Yes.    Do you have any questions about your discharge instructions: Diet   No. Medications  No. Follow up visit  No.  Do you have questions or concerns about your Care? No.  Actions: * If pain score is 4 or above: No action needed, pain <4.

## 2014-01-23 ENCOUNTER — Other Ambulatory Visit (HOSPITAL_COMMUNITY): Payer: Self-pay | Admitting: Nurse Practitioner

## 2014-01-23 DIAGNOSIS — K7469 Other cirrhosis of liver: Principal | ICD-10-CM

## 2014-01-23 DIAGNOSIS — B1920 Unspecified viral hepatitis C without hepatic coma: Secondary | ICD-10-CM

## 2014-02-01 ENCOUNTER — Ambulatory Visit: Payer: Managed Care, Other (non HMO) | Admitting: Physician Assistant

## 2014-02-01 ENCOUNTER — Encounter: Payer: Self-pay | Admitting: Physician Assistant

## 2014-02-01 VITALS — BP 110/68 | HR 84 | Temp 97.9°F | Resp 16 | Ht 72.0 in | Wt 226.0 lb

## 2014-02-01 DIAGNOSIS — R51 Headache: Secondary | ICD-10-CM

## 2014-02-01 DIAGNOSIS — Z125 Encounter for screening for malignant neoplasm of prostate: Secondary | ICD-10-CM

## 2014-02-01 DIAGNOSIS — Z0001 Encounter for general adult medical examination with abnormal findings: Secondary | ICD-10-CM

## 2014-02-01 DIAGNOSIS — R519 Headache, unspecified: Secondary | ICD-10-CM

## 2014-02-01 DIAGNOSIS — R112 Nausea with vomiting, unspecified: Secondary | ICD-10-CM

## 2014-02-01 DIAGNOSIS — R42 Dizziness and giddiness: Secondary | ICD-10-CM

## 2014-02-01 LAB — CBC WITH DIFFERENTIAL/PLATELET
BASOS ABS: 0.1 10*3/uL (ref 0.0–0.1)
Basophils Relative: 1 % (ref 0–1)
Eosinophils Absolute: 0.2 10*3/uL (ref 0.0–0.7)
Eosinophils Relative: 3 % (ref 0–5)
HEMATOCRIT: 43.2 % (ref 39.0–52.0)
HEMOGLOBIN: 14.8 g/dL (ref 13.0–17.0)
LYMPHS PCT: 22 % (ref 12–46)
Lymphs Abs: 1.2 10*3/uL (ref 0.7–4.0)
MCH: 29.7 pg (ref 26.0–34.0)
MCHC: 34.3 g/dL (ref 30.0–36.0)
MCV: 86.7 fL (ref 78.0–100.0)
MONO ABS: 0.5 10*3/uL (ref 0.1–1.0)
MONOS PCT: 9 % (ref 3–12)
NEUTROS ABS: 3.6 10*3/uL (ref 1.7–7.7)
Neutrophils Relative %: 65 % (ref 43–77)
Platelets: 199 10*3/uL (ref 150–400)
RBC: 4.98 MIL/uL (ref 4.22–5.81)
RDW: 14.1 % (ref 11.5–15.5)
WBC: 5.5 10*3/uL (ref 4.0–10.5)

## 2014-02-01 LAB — HEMOGLOBIN A1C
Hgb A1c MFr Bld: 6 % — ABNORMAL HIGH (ref <5.7)
Mean Plasma Glucose: 126 mg/dL — ABNORMAL HIGH (ref <117)

## 2014-02-01 NOTE — Progress Notes (Signed)
Complete Physical  Assessment and Plan: Sarcoid- continue follow up with Dr. Shelle Stewart, smoking cessation discussed  Hepatitis C- follow up with Dr. Christella Stewart, the importance of alcohol cessation discussed and weight loss  Diabetes mellitus without complication- Discussed general issues about diabetes pathophysiology and management., Educational material distributed., Suggested low cholesterol diet., Encouraged aerobic exercise., Discussed foot care., Reminded to get yearly retinal exam.  Hypercholesteremia- -continue medications, check lipids, decrease fatty foods, increase activity. May need to add Lipitor to prevent pancreatitis.   Smoking cessation- discussed in length, he would like to quit but declines any treatment/smoking cessation aid at this time.  Questionable alcohol use, patient states only drinks on weekends but admits to drinking several beers and mixed drinks last night with nausea/vomiting- will check amylase to rule out pancreatitis with trigs and alochol. Counseled on alcohol abuse.   Marijuana use- states rare, counseled on drug abuse.   Obesity with co morbidities- long discussion about weight loss, diet, and exercise  Thrombocytopenia- check CBC  Colon polyps- recent colonoscopy with Dr. Christella Stewart  DDD (degenerative disc disease)- continue follow up with Ortho  History of cranial injury with episodes of dizziness/memory issues- rule out seizure- will refer to neurology. With history of drinking will check thiamine, iron, B12. ? Sleep apnea- declines study    Discussed med's effects and SE's. Screening labs and tests as requested with regular follow-up as recommended.  HPI Patient presents for a complete physical.    His blood pressure has been controlled at home, today their BP is BP: 110/68 mmHg He does not workout. He denies chest pain, shortness of breath, dizziness.  He is on cholesterol medication, fenofibrate and denies myalgias. His cholesterol is not at goal. The  cholesterol last visit was:   Lab Results  Component Value Date   CHOL 123 10/12/2013   HDL 22* 10/12/2013   LDLCALC NOT CALC 10/12/2013   TRIG 639* 10/12/2013   CHOLHDL 5.6 10/12/2013    He has been working on diet and exercise for prediabetes, and denies paresthesia of the feet, polydipsia and polyuria. Last A1C in the office was:  Lab Results  Component Value Date   HGBA1C 5.7* 10/12/2013   Patient is on Vitamin D supplement.   Lab Results  Component Value Date   VD25OH 2646 10/12/2013     Last PSA is unknown He smokes and has sarcoidosis, follows with Dr. Shelle Stewart and has seen Dr. SwazilandJordan in the past.  He states after drinking several beers and a mixed drink, about an hour he stood and felt nausea, vomited once and thought he was going to pass out. This past week has had frontal HA, dull ache, takes aleve which helps. Complicated by head injury when he was younger, states it is reoccuring. States thinks it is seizure but denies bladder loss/post ictal phase.   Still complains of left shoulder pain and thinks it is "swollen" compared to left. Likely from cervical neck, need to follow with ortho.  Follows with Dr. Christella Stewart for hepatitis C  Lab Results  Component Value Date   ALT 34 10/12/2013   AST 29 10/12/2013   ALKPHOS 80 10/12/2013   BILITOT 0.8 10/12/2013    Current Medications:  Current Outpatient Prescriptions on File Prior to Visit  Medication Sig Dispense Refill  . fenofibrate micronized (LOFIBRA) 134 MG capsule TAKE 1 CAPSULE (134 MG TOTAL) BY MOUTH DAILY BEFORE BREAKFAST. 90 capsule 0  . gabapentin (NEURONTIN) 300 MG capsule Can take 1 pill with breakfast, 1 pill  at lunch and 2 pills at dinner for pain as needed. 360 capsule 0  . metFORMIN (GLUCOPHAGE) 500 MG tablet TAKE 1 TABLET THREE TIMES A DAY 270 tablet 0   No current facility-administered medications on file prior to visit.   Health Maintenance:  Immunization History  Administered Date(s) Administered  .  Influenza Whole 01/17/2012  . Pneumococcal Conjugate-13 12/28/2011  . Pneumococcal Polysaccharide-23 10/03/2012  . Tdap 10/04/2011    Tetanus: 09/2011 Pneumovax: Flu vaccine: will get at [facility] Zostavax: DEXA: Colonoscopy: 12/2013 Dr. Christella Hartigan EGD: Heart cath 04/2013  Allergies:  Allergies  Allergen Reactions  . Chantix [Varenicline]     Nausea  . Lopid [Gemfibrozil]     Nausea/weakness   Medical History:  Past Medical History  Diagnosis Date  . Sarcoid   . Hepatitis C   . Diabetes mellitus without complication   . Hypercholesteremia   . Tobacco abuse   . Hiatal hernia   . Marijuana use   . Chest pain 05/16/2012  . Thrombocytopenia   . Osteoarthritis   . Colon polyps   . DDD (degenerative disc disease)    Surgical History:  Past Surgical History  Procedure Laterality Date  . Appendectomy    . Head trauma  1975   Family History:  Family History  Problem Relation Age of Onset  . Leukemia Mother   . Diabetes Mellitus II Mother   . Other      No family h/o CAD  . Cancer Father     prostate  . Colon cancer Neg Hx   . Pancreatic cancer Neg Hx   . Rectal cancer Neg Hx   . Stomach cancer Neg Hx    Social History:   History  Substance Use Topics  . Smoking status: Current Every Day Smoker -- 0.50 packs/day for 30 years    Types: Cigarettes  . Smokeless tobacco: Never Used     Comment: " i AM WORKING ON QUITTING MYSELF  . Alcohol Use: 3.6 oz/week    6 Cans of beer per week    Review of Systems: see HPI  Physical Exam: Estimated body mass index is 30.64 kg/(m^2) as calculated from the following:   Height as of this encounter: 6' (1.829 m).   Weight as of this encounter: 226 lb (102.513 kg). BP 110/68 mmHg  Pulse 84  Temp(Src) 97.9 F (36.6 C)  Resp 16  Ht 6' (1.829 m)  Wt 226 lb (102.513 kg)  BMI 30.64 kg/m2  SpO2 97% General Appearance: Obese, in no apparent distress.  Eyes: PERRLA, EOMs, conjunctiva no swelling or erythema, normal fundi and  vessels.  Sinuses: No Frontal/maxillary tenderness  ENT/Mouth: Ext aud canals clear,  Crowded mouth, normal light reflex with TMs without erythema, bulging. Good dentition. No erythema, swelling, or exudate on post pharynx. Tonsils not swollen or erythematous. Hearing normal.  Neck: Supple, thyroid normal. No bruits  Respiratory: Respiratory effort normal, BS equal bilaterally without rales, rhonchi, wheezing or stridor.  Cardio: RRR without murmurs, rubs or gallops. Brisk peripheral pulses without edema.  Chest: symmetric, with normal excursions and percussion.  Abdomen: Soft, nontender, obese no guarding, rebound, hernias, masses, or organomegaly. .  Lymphatics: Non tender without lymphadenopathy.  Genitourinary: defer Musculoskeletal: Full ROM all peripheral extremities,5/5 strength, and normal gait.  Skin: Warm, dry without rashes, lesions, ecchymosis. Neuro: Cranial nerves intact, reflexes equal bilaterally. Normal muscle tone, no cerebellar symptoms. Psych: Awake and oriented X 3, normal affect, Insight and Judgment appropriate.   EKG: WNL no  changes.  Jerry Stewart, Jerry Stewart 10:13 AM [facility]

## 2014-02-02 LAB — URINALYSIS, ROUTINE W REFLEX MICROSCOPIC
BILIRUBIN URINE: NEGATIVE
GLUCOSE, UA: NEGATIVE mg/dL
HGB URINE DIPSTICK: NEGATIVE
Ketones, ur: NEGATIVE mg/dL
LEUKOCYTES UA: NEGATIVE
Nitrite: NEGATIVE
PH: 5 (ref 5.0–8.0)
Protein, ur: NEGATIVE mg/dL
Specific Gravity, Urine: 1.02 (ref 1.005–1.030)
Urobilinogen, UA: 1 mg/dL (ref 0.0–1.0)

## 2014-02-02 LAB — BASIC METABOLIC PANEL WITH GFR
BUN: 10 mg/dL (ref 6–23)
CO2: 24 mEq/L (ref 19–32)
Calcium: 9.1 mg/dL (ref 8.4–10.5)
Chloride: 105 mEq/L (ref 96–112)
Creat: 0.82 mg/dL (ref 0.50–1.35)
GFR, Est African American: >89 mL/min
GLUCOSE: 137 mg/dL — AB (ref 70–99)
POTASSIUM: 4.2 meq/L (ref 3.5–5.3)
Sodium: 139 mEq/L (ref 135–145)

## 2014-02-02 LAB — IRON AND TIBC
%SAT: 27 % (ref 20–55)
IRON: 113 ug/dL (ref 42–165)
TIBC: 412 ug/dL (ref 215–435)
UIBC: 299 ug/dL (ref 125–400)

## 2014-02-02 LAB — INSULIN, FASTING: INSULIN FASTING, SERUM: 38.7 u[IU]/mL — AB (ref 2.0–19.6)

## 2014-02-02 LAB — SEDIMENTATION RATE: SED RATE: 1 mm/h (ref 0–16)

## 2014-02-02 LAB — LIPID PANEL
Cholesterol: 97 mg/dL (ref 0–200)
HDL: 27 mg/dL — AB (ref >39)
LDL CALC: 12 mg/dL (ref 0–99)
Total CHOL/HDL Ratio: 3.6 Ratio
Triglycerides: 289 mg/dL — ABNORMAL HIGH (ref <150)
VLDL: 58 mg/dL — ABNORMAL HIGH (ref 0–40)

## 2014-02-02 LAB — MICROALBUMIN / CREATININE URINE RATIO
Creatinine, Urine: 129.1 mg/dL
MICROALB/CREAT RATIO: 7 mg/g (ref 0.0–30.0)
Microalb, Ur: 0.9 mg/dL (ref <2.0)

## 2014-02-02 LAB — HEPATIC FUNCTION PANEL
ALK PHOS: 80 U/L (ref 39–117)
ALT: 29 U/L (ref 0–53)
AST: 27 U/L (ref 0–37)
Albumin: 3.7 g/dL (ref 3.5–5.2)
BILIRUBIN INDIRECT: 0.4 mg/dL (ref 0.2–1.2)
Bilirubin, Direct: 0.2 mg/dL (ref 0.0–0.3)
TOTAL PROTEIN: 6.4 g/dL (ref 6.0–8.3)
Total Bilirubin: 0.6 mg/dL (ref 0.2–1.2)

## 2014-02-02 LAB — VITAMIN B12: VITAMIN B 12: 492 pg/mL (ref 211–911)

## 2014-02-02 LAB — VITAMIN D 25 HYDROXY (VIT D DEFICIENCY, FRACTURES): Vit D, 25-Hydroxy: 35 ng/mL (ref 30–89)

## 2014-02-02 LAB — TSH: TSH: 0.504 u[IU]/mL (ref 0.350–4.500)

## 2014-02-02 LAB — TESTOSTERONE: Testosterone: 1012 ng/dL — ABNORMAL HIGH (ref 300–890)

## 2014-02-02 LAB — URIC ACID: URIC ACID, SERUM: 3.5 mg/dL — AB (ref 4.0–7.8)

## 2014-02-02 LAB — AMYLASE: AMYLASE: 104 U/L (ref 0–105)

## 2014-02-02 LAB — FERRITIN: Ferritin: 726 ng/mL — ABNORMAL HIGH (ref 22–322)

## 2014-02-02 LAB — PSA: PSA: 1.14 ng/mL (ref <=4.00)

## 2014-02-02 LAB — MAGNESIUM: MAGNESIUM: 1.8 mg/dL (ref 1.5–2.5)

## 2014-02-04 DIAGNOSIS — R112 Nausea with vomiting, unspecified: Secondary | ICD-10-CM

## 2014-02-04 DIAGNOSIS — Z0001 Encounter for general adult medical examination with abnormal findings: Secondary | ICD-10-CM

## 2014-02-04 DIAGNOSIS — E559 Vitamin D deficiency, unspecified: Secondary | ICD-10-CM

## 2014-02-04 DIAGNOSIS — R51 Headache: Secondary | ICD-10-CM

## 2014-02-04 DIAGNOSIS — Z125 Encounter for screening for malignant neoplasm of prostate: Secondary | ICD-10-CM

## 2014-02-04 DIAGNOSIS — Z79899 Other long term (current) drug therapy: Secondary | ICD-10-CM

## 2014-02-04 DIAGNOSIS — R6889 Other general symptoms and signs: Secondary | ICD-10-CM

## 2014-02-04 NOTE — Addendum Note (Signed)
Addended by: Quentin MullingOLLIER, Dyke Weible R on: 02/04/2014 12:44 PM   Modules accepted: Kipp BroodSmartSet

## 2014-02-06 LAB — VITAMIN B1: VITAMIN B1 (THIAMINE): 9 nmol/L (ref 8–30)

## 2014-02-07 ENCOUNTER — Ambulatory Visit (HOSPITAL_COMMUNITY)
Admission: RE | Admit: 2014-02-07 | Discharge: 2014-02-07 | Disposition: A | Discharge status: Home / Self Care | Payer: Managed Care, Other (non HMO) | Source: Ambulatory Visit | Attending: Nurse Practitioner | Admitting: Nurse Practitioner

## 2014-02-07 DIAGNOSIS — K7460 Unspecified cirrhosis of liver: Secondary | ICD-10-CM | POA: Insufficient documentation

## 2014-02-07 DIAGNOSIS — K769 Liver disease, unspecified: Secondary | ICD-10-CM | POA: Insufficient documentation

## 2014-02-07 DIAGNOSIS — B1920 Unspecified viral hepatitis C without hepatic coma: Secondary | ICD-10-CM

## 2014-02-07 DIAGNOSIS — N281 Cyst of kidney, acquired: Secondary | ICD-10-CM | POA: Diagnosis not present

## 2014-02-07 DIAGNOSIS — K760 Fatty (change of) liver, not elsewhere classified: Secondary | ICD-10-CM | POA: Insufficient documentation

## 2014-02-07 DIAGNOSIS — K7469 Other cirrhosis of liver: Secondary | ICD-10-CM

## 2014-02-13 ENCOUNTER — Ambulatory Visit (INDEPENDENT_AMBULATORY_CARE_PROVIDER_SITE_OTHER): Payer: Medicare HMO | Admitting: Diagnostic Neuroimaging

## 2014-02-13 ENCOUNTER — Encounter: Payer: Self-pay | Admitting: Diagnostic Neuroimaging

## 2014-02-13 VITALS — Temp 97.6°F | Ht 72.0 in | Wt 223.0 lb

## 2014-02-13 DIAGNOSIS — R42 Dizziness and giddiness: Secondary | ICD-10-CM

## 2014-02-13 DIAGNOSIS — R55 Syncope and collapse: Secondary | ICD-10-CM

## 2014-02-13 DIAGNOSIS — G44039 Episodic paroxysmal hemicrania, not intractable: Secondary | ICD-10-CM

## 2014-02-13 DIAGNOSIS — S0990XS Unspecified injury of head, sequela: Secondary | ICD-10-CM
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
## 2014-02-13 NOTE — Patient Instructions (Signed)
I will check MRI brain and EEG test.

## 2014-02-19 ENCOUNTER — Ambulatory Visit (INDEPENDENT_AMBULATORY_CARE_PROVIDER_SITE_OTHER): Payer: Medicare HMO | Admitting: Diagnostic Neuroimaging

## 2014-02-19 DIAGNOSIS — G44039 Episodic paroxysmal hemicrania, not intractable: Secondary | ICD-10-CM

## 2014-02-19 DIAGNOSIS — R42 Dizziness and giddiness: Secondary | ICD-10-CM

## 2014-02-23 NOTE — Procedures (Signed)
   [facility]  EEG (ELECTROENCEPHALOGRAM) REPORT   STUDY DATE: 02/19/14 PATIENT NAME: Jerry Stewart Venti DOB: 12/29/1955 MRN: [id number]  ORDERING CLINICIAN: Joycelyn SchmidVikram Penumalli, MD   TECHNOLOGIST: Gearldine ShownLorraine Jones  TECHNIQUE: Electroencephalogram was recorded utilizing standard 10-20 system of lead placement and reformatted into average and bipolar montages.  RECORDING TIME: 33 minutes ACTIVATION: hyperventilation and photic stimulation  CLINICAL INFORMATION: 58 year old male with headaches, dizziness and syncope.  FINDINGS: Background rhythms of 10-11 hertz and 40-50 microvolts. No focal, lateralizing, epileptiform activity or seizures are seen. Patient recorded in the awake and asleep state.  IMPRESSION:  Normal awake and asleep EEG study.    INTERPRETING PHYSICIAN:  Suanne MarkerVIKRAM R. PENUMALLI, MD Certified in Neurology, Neurophysiology and Neuroimaging  [facility] [address], [address] [place], [place] [address] [phone]

## 2014-03-07 ENCOUNTER — Encounter (HOSPITAL_COMMUNITY): Payer: Self-pay | Admitting: Cardiology

## 2014-03-24 ENCOUNTER — Other Ambulatory Visit: Payer: Self-pay | Admitting: Physician Assistant

## 2014-03-25 ENCOUNTER — Other Ambulatory Visit: Payer: Self-pay | Admitting: Physician Assistant

## 2014-03-25 MED ORDER — METFORMIN HCL ER 500 MG PO TB24: ORAL_TABLET | ORAL | Status: DC

## 2014-03-27 ENCOUNTER — Encounter: Payer: Self-pay | Admitting: Diagnostic Neuroimaging

## 2014-03-27 ENCOUNTER — Ambulatory Visit (INDEPENDENT_AMBULATORY_CARE_PROVIDER_SITE_OTHER): Payer: Medicare HMO | Admitting: Diagnostic Neuroimaging

## 2014-03-27 VITALS — BP 116/72 | HR 87 | Temp 98.1°F | Ht 72.0 in | Wt 219.2 lb

## 2014-03-27 DIAGNOSIS — R42 Dizziness and giddiness: Secondary | ICD-10-CM

## 2014-03-27 NOTE — Patient Instructions (Signed)
Follow up as needed
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
## 2014-04-04 ENCOUNTER — Telehealth: Payer: Self-pay | Admitting: *Deleted

## 2014-04-04 MED ORDER — HEPATITIS A-HEP B RECOMB VAC 720-20 ELU-MCG/ML IM SUSP: 1.0000 mL | Freq: Once | INTRAMUSCULAR | Status: DC

## 2014-04-04 NOTE — Telephone Encounter (Signed)
Patient called and was seen at the hepatitis doctor for hep C.  Patient was told he needs to Hepatitis A and B vaccines.  Per Dr Oneta RackMcKeown, call in RX for TwinRix vaccine.

## 2014-05-06 ENCOUNTER — Ambulatory Visit: Payer: Self-pay | Admitting: Internal Medicine

## 2014-05-09 ENCOUNTER — Ambulatory Visit (INDEPENDENT_AMBULATORY_CARE_PROVIDER_SITE_OTHER): Payer: Managed Care, Other (non HMO) | Admitting: Internal Medicine

## 2014-05-09 DIAGNOSIS — R69 Illness, unspecified: Secondary | ICD-10-CM

## 2014-05-12 NOTE — Progress Notes (Signed)
Patient ID: Marvene StaffGeorge Pontius, male   DOB: 1955/09/19, 59 y.o.   MRN: [id number]  R E S C H E D U L E D

## 2014-06-06 ENCOUNTER — Encounter: Payer: Self-pay | Admitting: Physician Assistant

## 2014-06-06 ENCOUNTER — Ambulatory Visit (INDEPENDENT_AMBULATORY_CARE_PROVIDER_SITE_OTHER): Payer: Managed Care, Other (non HMO) | Admitting: Physician Assistant

## 2014-06-06 VITALS — BP 110/72 | HR 80 | Temp 97.7°F | Resp 16 | Ht 72.0 in | Wt 216.0 lb

## 2014-06-06 DIAGNOSIS — Z720 Tobacco use: Secondary | ICD-10-CM

## 2014-06-06 DIAGNOSIS — M79642 Pain in left hand: Secondary | ICD-10-CM

## 2014-06-06 DIAGNOSIS — B1920 Unspecified viral hepatitis C without hepatic coma: Secondary | ICD-10-CM

## 2014-06-06 DIAGNOSIS — R7309 Other abnormal glucose: Secondary | ICD-10-CM

## 2014-06-06 DIAGNOSIS — E559 Vitamin D deficiency, unspecified: Secondary | ICD-10-CM

## 2014-06-06 DIAGNOSIS — D696 Thrombocytopenia, unspecified: Secondary | ICD-10-CM

## 2014-06-06 DIAGNOSIS — E663 Overweight: Secondary | ICD-10-CM

## 2014-06-06 DIAGNOSIS — R7303 Prediabetes: Secondary | ICD-10-CM

## 2014-06-06 DIAGNOSIS — I10 Essential (primary) hypertension: Secondary | ICD-10-CM

## 2014-06-06 DIAGNOSIS — E785 Hyperlipidemia, unspecified: Secondary | ICD-10-CM

## 2014-06-06 DIAGNOSIS — Z79899 Other long term (current) drug therapy: Secondary | ICD-10-CM

## 2014-06-06 DIAGNOSIS — M79641 Pain in right hand: Secondary | ICD-10-CM

## 2014-06-06 LAB — CBC WITH DIFFERENTIAL/PLATELET
BASOS ABS: 0.1 10*3/uL (ref 0.0–0.1)
BASOS PCT: 1 % (ref 0–1)
Eosinophils Absolute: 0.2 10*3/uL (ref 0.0–0.7)
Eosinophils Relative: 3 % (ref 0–5)
HEMATOCRIT: 46.6 % (ref 39.0–52.0)
Hemoglobin: 15.8 g/dL (ref 13.0–17.0)
Lymphocytes Relative: 26 % (ref 12–46)
Lymphs Abs: 1.6 10*3/uL (ref 0.7–4.0)
MCH: 29.3 pg (ref 26.0–34.0)
MCHC: 33.9 g/dL (ref 30.0–36.0)
MCV: 86.3 fL (ref 78.0–100.0)
MPV: 10 fL (ref 8.6–12.4)
Monocytes Absolute: 0.5 10*3/uL (ref 0.1–1.0)
Monocytes Relative: 9 % (ref 3–12)
NEUTROS ABS: 3.7 10*3/uL (ref 1.7–7.7)
Neutrophils Relative %: 61 % (ref 43–77)
PLATELETS: 230 10*3/uL (ref 150–400)
RBC: 5.4 MIL/uL (ref 4.22–5.81)
RDW: 13.5 % (ref 11.5–15.5)
WBC: 6 10*3/uL (ref 4.0–10.5)

## 2014-06-06 MED ORDER — MELOXICAM 15 MG PO TABS: ORAL_TABLET | ORAL | Status: DC

## 2014-06-06 NOTE — Progress Notes (Signed)
Assessment and Plan:  Hypertension: Continue medication, monitor blood pressure at home. Continue DASH diet.  Reminder to go to the ER if any CP, SOB, nausea, dizziness, severe HA, changes vision/speech, left arm numbness and tingling and jaw pain. Cholesterol: Continue diet and exercise. Check cholesterol.  PreDiabetes -Continue diet and exercise. Check A1C Vitamin D Def- check level and continue medications.  Obesity with co morbidities- long discussion about weight loss, diet, and exercise Bilateral hand pain- check RF/autoimmune labs, meloxicam sent in Smoking- start chantix, do not take before bed, call if any issues  Continue diet and meds as discussed. Further disposition pending results of labs. Discussed med's effects and SE's.    HPI 59 y.o. male  presents for 3 month follow up  has a past medical history of Sarcoid; Hepatitis C; Diabetes mellitus without complication; Hypercholesteremia; Tobacco abuse; Hiatal hernia; Marijuana use; Chest pain (05/16/2012); Thrombocytopenia; Osteoarthritis; Colon polyps; and DDD (degenerative disc disease).  His blood pressure has been controlled at home, today his BP is BP: 110/72 mmHg.  He does not workout. He denies chest pain, shortness of breath, dizziness.  He is on cholesterol medication and denies myalgias. His cholesterol is at goal. The cholesterol was:  02/01/2014: Cholesterol, Total 97; HDL-C 27*; LDL (calc) 12; Triglycerides 289*  He has been working on diet and exercise for prediabetes, he is on MF 500mg  BID  and denies  paresthesia of the feet, polydipsia, polyuria and visual disturbances. Last A1C was: 02/01/2014: Hemoglobin-A1c 6.0*  He has a history of head injury when he was younger, was having dizziness/HA but resolved, ? From ETOH, follows with Dr. Marjory LiesPenumalli. He did have low thimine and has been on B complex.  He has a history of hepatitis C, follows with Dr. Lucas MallowZamar, treated on 04/22/2014, [facility], and he has decreased his beer drinking,  has been drinking [employer]. He has not had the Hep A&B vaccine, it is at [facility] waiting for pick up.  He states that he has started a new job, uses his hands a lot, he has swelling/stiffness in the AM for hours.   Patient is on Vitamin D supplement. 02/01/2014: VITD 35  BMI is Body mass index is 29.29 kg/(m^2)., he is working on diet and exercise. Wt Readings from Last 3 Encounters:  06/06/14 216 lb (97.977 kg)  03/27/14 219 lb 3.2 oz (99.428 kg)  02/13/14 223 lb (101.152 kg)    Current Medications:  Current Outpatient Prescriptions on File Prior to Visit  Medication Sig Dispense Refill  . B Complex-Biotin-FA (SUPER B-100 PO) Take 1 tablet by mouth daily.    . fenofibrate micronized (LOFIBRA) 134 MG capsule TAKE 1 CAPSULE (134 MG TOTAL) BY MOUTH DAILY BEFORE BREAKFAST. 90 capsule 1  . gabapentin (NEURONTIN) 300 MG capsule Can take 1 pill with breakfast, 1 pill at lunch and 2 pills at dinner for pain as needed. 360 capsule 0  . hepatitis A-hepatitis B (TWINRIX) 720-20 ELU-MCG/ML injection Inject 1 mL into the muscle once. 1 mL 2  . metFORMIN (GLUCOPHAGE XR) 500 MG 24 hr tablet Take 1 to 2 tablets 2 x day for Diabetes 360 tablet 1  . Omega-3 Fatty Acids (FISH OIL PO) Take 1 capsule by mouth daily.    [name]. VITAMIN D, ERGOCALCIFEROL, PO Take 1 tablet by mouth daily.     No current facility-administered medications on file prior to visit.   Medical History:  Past Medical History  Diagnosis Date  . Sarcoid   . Hepatitis C   .  Diabetes mellitus without complication   . Hypercholesteremia   . Tobacco abuse   . Hiatal hernia   . Marijuana use   . Chest pain 05/16/2012  . Thrombocytopenia   . Osteoarthritis   . Colon polyps   . DDD (degenerative disc disease)    Allergies:  Allergies  Allergen Reactions  . Chantix [Varenicline]     Nausea  . Lopid [Gemfibrozil]     Nausea/weakness    Immunization History  Administered Date(s) Administered  . Influenza Whole 01/17/2012  . Pneumococcal  Conjugate-13 12/28/2011  . Pneumococcal Polysaccharide-23 10/03/2012  . Tdap 10/04/2011   Review of Systems:  Review of Systems  Constitutional: Negative.   HENT: Negative.   Eyes: Negative.   Respiratory: Negative.   Cardiovascular: Negative.   Gastrointestinal: Negative.   Genitourinary: Negative.   Musculoskeletal: Positive for back pain and joint pain. Negative for myalgias, falls and neck pain.  Skin: Negative.   Neurological: Negative.   Endo/Heme/Allergies: Negative.   Psychiatric/Behavioral: Negative.     Family history- Review and unchanged Social history- Review and unchanged Physical Exam: BP 110/72 mmHg  Pulse 80  Temp(Src) 97.7 F (36.5 C)  Resp 16  Ht 6' (1.829 m)  Wt 216 lb (97.977 kg)  BMI 29.29 kg/m2 Wt Readings from Last 3 Encounters:  06/06/14 216 lb (97.977 kg)  03/27/14 219 lb 3.2 oz (99.428 kg)  02/13/14 223 lb (101.152 kg)   General Appearance: Well nourished, in no apparent distress. Eyes: PERRLA, EOMs, conjunctiva no swelling or erythema Sinuses: No Frontal/maxillary tenderness ENT/Mouth: Ext aud canals clear, TMs without erythema, bulging. No erythema, swelling, or exudate on post pharynx.  Tonsils not swollen or erythematous. Hearing normal.  Neck: Supple, thyroid normal.  Respiratory: Respiratory effort normal, BS equal bilaterally without rales, rhonchi, wheezing or stridor.  Cardio: RRR with no MRGs. Brisk peripheral pulses without edema.  Abdomen: Soft, + BS.  Non tender, no guarding, rebound, hernias, masses. Lymphatics: Non tender without lymphadenopathy.  Musculoskeletal: Full ROM, 5/5 strength, Normal gait Skin: Warm, dry without rashes, lesions, ecchymosis.  Neuro: Cranial nerves intact. No cerebellar symptoms.  Psych: Awake and oriented X 3, normal affect, Insight and Judgment appropriate.    Quentin Mulling, PA-C 11:30 AM [facility]

## 2014-06-06 NOTE — Patient Instructions (Addendum)
We are starting you on Metformin to prevent or treat diabetes. Metformin does not cause low blood sugars. In order to create energy your cells need insulin and sugar but sometime your cells do not accept the insulin and this can cause increased sugars and decreased energy. The Metformin helps your cells accept insulin and the sugar to give you more energy.   The two most common side effects are nausea and diarrhea, follow these rules to avoid it! You can take imodium per box instructions when starting metformin if needed.   Rules of metformin: 1) start out slow with only one pill daily. Our goal for you is 4 pills a day or 2000mg  total.  2) take with your largest meal. 3) Take with least amount of carbs.   Call if you have any problems.   .We are giving you chantix for smoking cessation. You can do it! And we are here to help! You may have heard some scary side effects about chantix, the three most common I hear about are nausea, crazy dreams and depression.  However, I like for my patients to try to stay on 1/2 a tablet twice a day rather than one tablet twice a day as normally prescribed. This helps decrease the chances of side effects and helps save money by making a one month prescription last two months  Please start the prescription this way:  Start 1/2 tablet by mouth once daily after food with a full glass of water for 3 days WHEN YOU WAKE UP Then do 1 tablet by mouth for 4 days During this first week you can smoke, but try to stop after this week.  At this point we have several options: 2) do one tablet when you wake up and NONE before bed .   What if I miss a dose? If you miss a dose, take it as soon as you can. If it is almost time for your next dose, take only that dose. Do not take double or extra doses.  What should I watch for while using this medicine? Visit your doctor or health care professional for regular check ups. Ask for ongoing advice and encouragement from your  doctor or healthcare professional, friends, and family to help you quit. If you smoke while on this medication, quit again  Your mouth may get dry. Chewing sugarless gum or hard candy, and drinking plenty of water may help. Contact your doctor if the problem does not go away or is severe.  You may get drowsy or dizzy. Do not drive, use machinery, or do anything that needs mental alertness until you know how this medicine affects you. Do not stand or sit up quickly, especially if you are an older patient.   The use of this medicine may increase the chance of suicidal thoughts or actions. Pay special attention to how you are responding while on this medicine. Any worsening of mood, or thoughts of suicide or dying should be reported to your health care professional right away.  ADVANTAGES OF QUITTING SMOKING  Within 20 minutes, blood pressure decreases. Your pulse is at normal level.  After 8 hours, carbon monoxide levels in the blood return to normal. Your oxygen level increases.  After 24 hours, the chance of having a heart attack starts to decrease. Your breath, hair, and body stop smelling like smoke.  After 48 hours, damaged nerve endings begin to recover. Your sense of taste and smell improve.  After 72 hours, the body is  virtually free of nicotine. Your bronchial tubes relax and breathing becomes easier.  After 2 to 12 weeks, lungs can hold more air. Exercise becomes easier and circulation improves.  After 1 year, the risk of coronary heart disease is cut in half.  After 5 years, the risk of stroke falls to the same as a nonsmoker.  After 10 years, the risk of lung cancer is cut in half and the risk of other cancers decreases significantly.  After 15 years, the risk of coronary heart disease drops, usually to the level of a nonsmoker. You will have extra money to spend on things other than cigarettes.

## 2014-06-07 LAB — HEPATIC FUNCTION PANEL
ALBUMIN: 4.5 g/dL (ref 3.5–5.2)
ALT: 17 U/L (ref 0–53)
AST: 18 U/L (ref 0–37)
Alkaline Phosphatase: 69 U/L (ref 39–117)
Bilirubin, Direct: 0.1 mg/dL (ref 0.0–0.3)
Indirect Bilirubin: 0.5 mg/dL (ref 0.2–1.2)
TOTAL PROTEIN: 7.2 g/dL (ref 6.0–8.3)
Total Bilirubin: 0.6 mg/dL (ref 0.2–1.2)

## 2014-06-07 LAB — BASIC METABOLIC PANEL WITH GFR
BUN: 12 mg/dL (ref 6–23)
CHLORIDE: 106 meq/L (ref 96–112)
CO2: 26 mEq/L (ref 19–32)
Calcium: 9.8 mg/dL (ref 8.4–10.5)
Creat: 0.87 mg/dL (ref 0.50–1.35)
GFR, Est African American: >89 mL/min
GFR, Est Non African American: >89 mL/min
GLUCOSE: 97 mg/dL (ref 70–99)
POTASSIUM: 4.6 meq/L (ref 3.5–5.3)
SODIUM: 139 meq/L (ref 135–145)

## 2014-06-07 LAB — LIPID PANEL
CHOLESTEROL: 177 mg/dL (ref 0–200)
HDL: 29 mg/dL — AB (ref >=40)
LDL Cholesterol: 122 mg/dL — ABNORMAL HIGH (ref 0–99)
Total CHOL/HDL Ratio: 6.1 Ratio
Triglycerides: 130 mg/dL (ref <150)
VLDL: 26 mg/dL (ref 0–40)

## 2014-06-07 LAB — HEMOGLOBIN A1C
Hgb A1c MFr Bld: 5.3 % (ref <5.7)
MEAN PLASMA GLUCOSE: 105 mg/dL (ref <117)

## 2014-06-07 LAB — RHEUMATOID FACTOR: Rhuematoid fact SerPl-aCnc: <10 IU/mL (ref <=14)

## 2014-06-07 LAB — ANA: ANA: NEGATIVE

## 2014-06-07 LAB — ANTI-DNA ANTIBODY, DOUBLE-STRANDED: ds DNA Ab: <1 IU/mL

## 2014-06-07 LAB — TSH: TSH: 0.654 u[IU]/mL (ref 0.350–4.500)

## 2014-06-07 LAB — VITAMIN D 25 HYDROXY (VIT D DEFICIENCY, FRACTURES): VIT D 25 HYDROXY: 21 ng/mL — AB (ref 30–100)

## 2014-06-07 LAB — INSULIN, FASTING: Insulin fasting, serum: 12.5 u[IU]/mL (ref 2.0–19.6)

## 2014-06-07 LAB — MAGNESIUM: Magnesium: 1.9 mg/dL (ref 1.5–2.5)

## 2014-06-11 LAB — HLA-B27 ANTIGEN: DNA Result:: NOT DETECTED

## 2014-06-28 ENCOUNTER — Encounter: Payer: Self-pay | Admitting: Internal Medicine

## 2014-06-28 ENCOUNTER — Ambulatory Visit (INDEPENDENT_AMBULATORY_CARE_PROVIDER_SITE_OTHER): Payer: Managed Care, Other (non HMO) | Admitting: Internal Medicine

## 2014-06-28 VITALS — BP 122/70 | HR 82 | Temp 98.4°F | Resp 18 | Ht 72.0 in | Wt 219.0 lb

## 2014-06-28 DIAGNOSIS — H00013 Hordeolum externum right eye, unspecified eyelid: Secondary | ICD-10-CM

## 2014-06-28 MED ORDER — ERYTHROMYCIN 5 MG/GM OP OINT: TOPICAL_OINTMENT | Freq: Three times a day (TID) | OPHTHALMIC | Status: AC

## 2014-06-28 NOTE — Patient Instructions (Signed)

## 2014-06-28 NOTE — Progress Notes (Signed)
   Subjective:    Patient ID: Jerry Stewart, male    DOB: 04-13-1955, 59 y.o.   MRN: [id number]  HPI  Patient presents with stye to the right eye.  He reports that it has been there for 2 weeks.  He has never had one before.  He has been trying warm compresses at home with no relief.  He reports no visual changes or eye discharge.    Review of Systems  Constitutional: Negative for fever, chills and fatigue.  HENT: Negative for congestion, postnasal drip, rhinorrhea, sinus pressure, tinnitus and voice change.   Eyes: Negative for pain, discharge, redness, itching and visual disturbance.  Respiratory: Negative for cough.        Objective:   Physical Exam  Constitutional: He is oriented to person, place, and time. He appears well-developed and well-nourished. No distress.  HENT:  Head: Normocephalic and atraumatic.  Mouth/Throat: Oropharynx is clear and moist. No oropharyngeal exudate.  Eyes: Conjunctivae and EOM are normal. Pupils are equal, round, and reactive to light. No scleral icterus.    Neck: Normal range of motion. Neck supple. No JVD present. No thyromegaly present.  Cardiovascular: Normal rate, regular rhythm, normal heart sounds and intact distal pulses.  Exam reveals no gallop and no friction rub.   No murmur heard. Pulmonary/Chest: Effort normal and breath sounds normal. No respiratory distress. He has no wheezes. He has no rales. He exhibits no tenderness.  Musculoskeletal: Normal range of motion.  Lymphadenopathy:    He has no cervical adenopathy.  Neurological: He is alert and oriented to person, place, and time.  Skin: Skin is warm and dry. He is not diaphoretic.  Psychiatric: He has a normal mood and affect. His behavior is normal. Judgment and thought content normal.  Nursing note and vitals reviewed.         Assessment & Plan:    1. Hordeolum externum, right -warm compresses -gentle massage -romycin ointment -no better in 1 week referral to eye  doctor.

## 2014-07-08 ENCOUNTER — Ambulatory Visit (INDEPENDENT_AMBULATORY_CARE_PROVIDER_SITE_OTHER): Payer: Managed Care, Other (non HMO) | Admitting: Internal Medicine

## 2014-07-08 ENCOUNTER — Encounter: Payer: Self-pay | Admitting: Internal Medicine

## 2014-07-08 VITALS — BP 124/70 | HR 74 | Temp 98.2°F | Resp 18 | Ht 72.0 in | Wt 220.0 lb

## 2014-07-08 DIAGNOSIS — H00013 Hordeolum externum right eye, unspecified eyelid: Secondary | ICD-10-CM

## 2014-07-08 NOTE — Patient Instructions (Signed)

## 2014-07-08 NOTE — Progress Notes (Signed)
   Subjective:    Patient ID: Jerry Stewart, male    DOB: Jun 04, 1955, 59 y.o.   MRN: [id number]  HPI  Patient returns to the office for evaluation of a right eye stye.  He reports that he has been using warm compresses, erythromycin ointment, and gentle massage.  He reports that it is doing a lot better.  He reports that he would like to watch and see how it goes.    Review of Systems  Constitutional: Negative for fever, chills and fatigue.  Eyes: Negative for pain, discharge and visual disturbance.  Gastrointestinal: Negative for nausea and vomiting.       Objective:   Physical Exam  Constitutional: He is oriented to person, place, and time. He appears well-developed and well-nourished. No distress.  HENT:  Head: Normocephalic and atraumatic.  Mouth/Throat: Oropharynx is clear and moist. No oropharyngeal exudate.  Eyes: Conjunctivae and EOM are normal. Pupils are equal, round, and reactive to light. Right eye exhibits hordeolum. Right eye exhibits no chemosis, no discharge and no exudate. No foreign body present in the right eye. Left eye exhibits no chemosis, no discharge, no exudate and no hordeolum. No foreign body present in the left eye. No scleral icterus.    Neck: Normal range of motion. Neck supple. No JVD present. No thyromegaly present.  Cardiovascular: Normal rate, regular rhythm, normal heart sounds and intact distal pulses.  Exam reveals no gallop and no friction rub.   No murmur heard. Pulmonary/Chest: Effort normal and breath sounds normal. No respiratory distress. He has no wheezes. He has no rales. He exhibits no tenderness.  Musculoskeletal: Normal range of motion.  Lymphadenopathy:    He has no cervical adenopathy.  Neurological: He is alert and oriented to person, place, and time.  Skin: Skin is warm and dry. He is not diaphoretic.  Psychiatric: He has a normal mood and affect. His behavior is normal. Judgment and thought content normal.  Nursing note and vitals  reviewed.  Filed Vitals:   07/08/14 0938  BP: 124/70  Pulse: 74  Temp: 98.2 F (36.8 C)  Resp: 18          Assessment & Plan:    1. Hordeolum external, right -warm compresses -romycin ointment -gentle massage -if increases in size or worsens then will refer to ophthalmology -patient to follow-up prn

## 2014-10-16 ENCOUNTER — Other Ambulatory Visit: Payer: Self-pay | Admitting: Internal Medicine

## 2014-10-17 ENCOUNTER — Other Ambulatory Visit: Payer: Self-pay | Admitting: Internal Medicine

## 2015-02-07 ENCOUNTER — Ambulatory Visit (INDEPENDENT_AMBULATORY_CARE_PROVIDER_SITE_OTHER): Payer: Managed Care, Other (non HMO) | Admitting: Physician Assistant

## 2015-02-07 ENCOUNTER — Ambulatory Visit (HOSPITAL_COMMUNITY)
Admission: RE | Admit: 2015-02-07 | Discharge: 2015-02-07 | Disposition: A | Discharge status: Home / Self Care | Payer: Managed Care, Other (non HMO) | Source: Ambulatory Visit | Attending: Physician Assistant | Admitting: Physician Assistant

## 2015-02-07 ENCOUNTER — Encounter: Payer: Self-pay | Admitting: Physician Assistant

## 2015-02-07 VITALS — BP 140/70 | HR 89 | Temp 98.1°F | Resp 14 | Ht 71.5 in | Wt 224.0 lb

## 2015-02-07 DIAGNOSIS — F1290 Cannabis use, unspecified, uncomplicated: Secondary | ICD-10-CM

## 2015-02-07 DIAGNOSIS — D869 Sarcoidosis, unspecified: Secondary | ICD-10-CM | POA: Insufficient documentation

## 2015-02-07 DIAGNOSIS — R7303 Prediabetes: Secondary | ICD-10-CM

## 2015-02-07 DIAGNOSIS — E663 Overweight: Secondary | ICD-10-CM

## 2015-02-07 DIAGNOSIS — E785 Hyperlipidemia, unspecified: Secondary | ICD-10-CM

## 2015-02-07 DIAGNOSIS — E8881 Metabolic syndrome: Secondary | ICD-10-CM

## 2015-02-07 DIAGNOSIS — Z79899 Other long term (current) drug therapy: Secondary | ICD-10-CM | POA: Diagnosis not present

## 2015-02-07 DIAGNOSIS — Z0000 Encounter for general adult medical examination without abnormal findings: Secondary | ICD-10-CM | POA: Diagnosis not present

## 2015-02-07 DIAGNOSIS — F17200 Nicotine dependence, unspecified, uncomplicated: Secondary | ICD-10-CM | POA: Insufficient documentation

## 2015-02-07 DIAGNOSIS — E559 Vitamin D deficiency, unspecified: Secondary | ICD-10-CM | POA: Diagnosis not present

## 2015-02-07 DIAGNOSIS — D649 Anemia, unspecified: Secondary | ICD-10-CM

## 2015-02-07 DIAGNOSIS — Z720 Tobacco use: Secondary | ICD-10-CM

## 2015-02-07 DIAGNOSIS — K449 Diaphragmatic hernia without obstruction or gangrene: Secondary | ICD-10-CM

## 2015-02-07 DIAGNOSIS — E519 Thiamine deficiency, unspecified: Secondary | ICD-10-CM

## 2015-02-07 DIAGNOSIS — Z125 Encounter for screening for malignant neoplasm of prostate: Secondary | ICD-10-CM

## 2015-02-07 DIAGNOSIS — M5136 Other intervertebral disc degeneration, lumbar region: Secondary | ICD-10-CM

## 2015-02-07 DIAGNOSIS — B1920 Unspecified viral hepatitis C without hepatic coma: Secondary | ICD-10-CM

## 2015-02-07 DIAGNOSIS — D696 Thrombocytopenia, unspecified: Secondary | ICD-10-CM

## 2015-02-07 DIAGNOSIS — I10 Essential (primary) hypertension: Secondary | ICD-10-CM | POA: Insufficient documentation

## 2015-02-07 DIAGNOSIS — R0989 Other specified symptoms and signs involving the circulatory and respiratory systems: Secondary | ICD-10-CM

## 2015-02-07 LAB — CBC WITH DIFFERENTIAL/PLATELET
BASOS ABS: 0.1 10*3/uL (ref 0.0–0.1)
Basophils Relative: 1 % (ref 0–1)
EOS ABS: 0.4 10*3/uL (ref 0.0–0.7)
Eosinophils Relative: 5 % (ref 0–5)
HCT: 44.4 % (ref 39.0–52.0)
Hemoglobin: 14.6 g/dL (ref 13.0–17.0)
LYMPHS ABS: 2.1 10*3/uL (ref 0.7–4.0)
Lymphocytes Relative: 25 % (ref 12–46)
MCH: 28.8 pg (ref 26.0–34.0)
MCHC: 32.9 g/dL (ref 30.0–36.0)
MCV: 87.6 fL (ref 78.0–100.0)
MPV: 10.2 fL (ref 8.6–12.4)
Monocytes Absolute: 0.7 10*3/uL (ref 0.1–1.0)
Monocytes Relative: 8 % (ref 3–12)
NEUTROS PCT: 61 % (ref 43–77)
Neutro Abs: 5 10*3/uL (ref 1.7–7.7)
PLATELETS: 264 10*3/uL (ref 150–400)
RBC: 5.07 MIL/uL (ref 4.22–5.81)
RDW: 13.7 % (ref 11.5–15.5)
WBC: 8.2 10*3/uL (ref 4.0–10.5)

## 2015-02-07 LAB — HEMOGLOBIN A1C
Hgb A1c MFr Bld: 5.2 % (ref <5.7)
MEAN PLASMA GLUCOSE: 103 mg/dL (ref <117)

## 2015-02-07 MED ORDER — ESOMEPRAZOLE MAGNESIUM 20 MG PO CPDR: 20.0000 mg | DELAYED_RELEASE_CAPSULE | Freq: Every day | ORAL | Status: DC

## 2015-02-07 NOTE — Patient Instructions (Addendum)
Vitamin D goal is between 60-80  Please make sure that you are taking your Vitamin D as directed.   It is very important as a natural anti-inflammatory   helping hair, skin, and nails, as well as reducing stroke and heart attack risk.   It helps your bones and helps with mood.  It also decreases numerous cancer risks so please take it as directed.   Low Vit D is associated with a 200-300% higher risk for CANCER   and 200-300% higher risk for HEART   ATTACK  &  STROKE.    .....................................[name]  It is also associated with higher death rate at younger ages,   autoimmune diseases like Rheumatoid arthritis, Lupus, Multiple Sclerosis.     Also many other serious conditions, like depression, Alzheimer's  Dementia, infertility, muscle aches, fatigue, fibromyalgia - just to name a few.  +++++++++++++++++++    Morton Neuralgia Morton neuralgia is a type of foot pain in the area closest to your toes. This area is sometimes called the ball of your foot. Morton neuralgia occurs when a branch of a nerve in your foot (digital nerve) becomes compressed.  When this happens over a long period of time, the nerve can thicken (neuroma) and cause pain. This usually occurs between the third and fourth toe. Morton neuralgia can come and go but may get worse over time.  CAUSES Your digital nerve can become compressed and stretched at a point where it passes under a thick band of tissue that connects your toes (intermetatarsal ligament). Morton neuralgia can be caused by mild repetitive damage in this area. This type of damage can result from:   Activities such as running or jumping.  Wearing shoes that are too tight. RISK FACTORS You may be at risk for Morton neuralgia if you:  Are male.  Wear high heels.  Wear shoes that are narrow or tight.  Participate in activities that stretch your toes. These include:  Running.  Ballet.  Long-distance walking. SIGNS AND  SYMPTOMS The first symptom of Morton neuralgia is pain that spreads from the ball of your foot to your toes. It may feel like you are walking on a marble. Pain usually gets worse with walking and goes away at night. Other symptoms may include numbness and cramping of your toes. DIAGNOSIS  Your health care provider will do a physical exam. When doing the exam, your health care provider may:   Squeeze your foot just behind your toe.  Ask you to move your toes to check for pain. You may also have tests on your foot to confirm the diagnosis. These may include:   An X-ray.  An MRI. TREATMENT  Treatment for Morton neuralgia may be as simple as changing the kind of shoes you wear. Other treatments may include:  Wearing a supportive pad (orthosis) under the front of your foot. This lifts your toe bones and takes pressure off the nerve.  Getting injections of numbing medicine and anti-inflammatory medicine (steroid) in the nerve.  Having surgery to remove part of the thickened nerve. HOME CARE INSTRUCTIONS   Take medicine only as directed by your health care provider.  Wear soft-soled shoes with a wide toe area.  Stop activities that may be causing pain.  Elevate your foot when resting.  Massage your foot.  Apply ice to the injured area:   Put ice in a plastic bag.  Place a towel between your skin and the bag.  Leave the ice on for 20 minutes,  2-3 times a day.   Keep all follow-up visits as directed by your health care provider. This is important. SEEK MEDICAL CARE IF:  Home care instructions are not helping you get better.  Your symptoms change or get worse.   This information is not intended to replace advice given to you by your health care provider. Make sure you discuss any questions you have with your health care provider.   Document Released: 06/21/2000 Document Revised: 04/05/2014 Document Reviewed: 05/16/2013 Elsevier Interactive Patient Education 2016 [employer]  Common causes of cough OR hoarseness OR sore throat:   Allergies, Viral Infections, Acid Reflux and Bacterial Infections.  1) Allergies and viral infections cause a cough OR sore throat by post nasal drip and are often worse at night, can also have sneezing, lower grade fevers, clear/yellow mucus. This is best treated with allergy medications or nasal sprays.  Please get on allegra for 1-2 weeks The strongest is allegra or fexafinadine  Cheapest at [employer], [employer], [facility]  2) Bacterial infections are more severe than allergies or viral infections with fever, teeth pain, fatigue. This can be treated with prednisone and the same over the counter medication and after 7 days can be treated with an antibiotic.   3) Silent reflux/GERD can cause a cough OR sore throat OR hoarseness WITHOUT heart burn because the esophagus that goes to the stomach and trachea that goes to the lungs are very close and when you lay down the acid can irritate your throat and lungs. This can cause hoarseness, cough, and wheezing. Please stop any alcohol or anti-inflammatories like aleve/advil/ibuprofen and start an over the counter Prilosec or omeprazole 1-2 times daily 30mins before food for 2 weeks, then switch to over the counter zantac/ratinidine or pepcid/famotadine once at night for 2 weeks.   4) sometimes irritation causes more irritation. Try voice rest, use sugar free cough drops to prevent coughing, and try to stop clearing your throat.   If you ever have a cough that does not go away after trying these things please make a follow up visit for further evaluation or we can refer you to a specialist. Or if you ever have shortness of breath or chest pain go to the ER.    If you have a smart phone, please look up Smoke Free app, this will help you stay on track and give you information about money you have saved, life that you have gained back and a ton of more information.   We are giving you chantix for smoking  cessation. You can do it! And we are here to help! You may have heard some scary side effects about chantix, the three most common I hear about are nausea, crazy dreams and depression.  However, I like for my patients to try to stay on 1/2 a tablet twice a day rather than one tablet twice a day as normally prescribed. This helps decrease the chances of side effects and helps save money by making a one month prescription last two months  Please start the prescription this way:  Start 1/2 tablet by mouth once daily after food with a full glass of water for 3 days Then do 1/2 tablet by mouth twice daily for 4 days. During this first week you can smoke, but try to stop after this week.  At this point we have several options: 1) continue on 1/2 tablet twice a day- which I encourage you to do. You can stay on this dose the rest  of the time on the medication or if you still feel the need to smoke you can do one of the two options below. 2) do one tablet in the morning and 1/2 in the evening which helps decrease dreams. 3) do one tablet twice a day.   What if I miss a dose? If you miss a dose, take it as soon as you can. If it is almost time for your next dose, take only that dose. Do not take double or extra doses.  What should I watch for while using this medicine? Visit your doctor or health care professional for regular check ups. Ask for ongoing advice and encouragement from your doctor or healthcare professional, friends, and family to help you quit. If you smoke while on this medication, quit again  Your mouth may get dry. Chewing sugarless gum or hard candy, and drinking plenty of water may help. Contact your doctor if the problem does not go away or is severe.  You may get drowsy or dizzy. Do not drive, use machinery, or do anything that needs mental alertness until you know how this medicine affects you. Do not stand or sit up quickly, especially if you are an older patient.   The use of this  medicine may increase the chance of suicidal thoughts or actions. Pay special attention to how you are responding while on this medicine. Any worsening of mood, or thoughts of suicide or dying should be reported to your health care professional right away.  ADVANTAGES OF QUITTING SMOKING  Within 20 minutes, blood pressure decreases. Your pulse is at normal level.  After 8 hours, carbon monoxide levels in the blood return to normal. Your oxygen level increases.  After 24 hours, the chance of having a heart attack starts to decrease. Your breath, hair, and body stop smelling like smoke.  After 48 hours, damaged nerve endings begin to recover. Your sense of taste and smell improve.  After 72 hours, the body is virtually free of nicotine. Your bronchial tubes relax and breathing becomes easier.  After 2 to 12 weeks, lungs can hold more air. Exercise becomes easier and circulation improves.  After 1 year, the risk of coronary heart disease is cut in half.  After 5 years, the risk of stroke falls to the same as a nonsmoker.  After 10 years, the risk of lung cancer is cut in half and the risk of other cancers decreases significantly.  After 15 years, the risk of coronary heart disease drops, usually to the level of a nonsmoker.  You will have extra money to spend on things other than cigarettes.

## 2015-02-07 NOTE — Progress Notes (Signed)
Complete Physical  Assessment and Plan: 1. Essential hypertension - continue medications, DASH diet, exercise and monitor at home. Call if greater than 130/80.  - BASIC METABOLIC PANEL WITH GFR - Hepatic function panel - TSH - Urinalysis, Routine w reflex microscopic (not at [facility]) - Microalbumin / creatinine urine ratio - EKG 12-Lead - [place], RETROPERITNL ABD,  LTD  2. Thrombocytopenia (HCC) - CBC with Differential/Platelet  3. Sarcoidosis (HCC) CXR, stop smoking  4. Tobacco abuse Smoking cessation-  instruction/counseling given, counseled patient on the dangers of tobacco use, advised patient to stop smoking, and reviewed strategies to maximize success, patient not ready to quit at this time.  - EKG 12-Lead - DG Chest 2 View; Future - [place], RETROPERITNL ABD,  LTD  5. Hepatitis C virus infection without hepatic coma, unspecified chronicity treated  6. Hyperlipidemia -continue medications, check lipids, decrease fatty foods, increase activity.  - Lipid panel  7. Prediabetes Discussed general issues about diabetes pathophysiology and management., Educational material distributed., Suggested low cholesterol diet., Encouraged aerobic exercise., Discussed foot care., Reminded to get yearly retinal exam. - Hemoglobin A1c - Insulin, fasting  8. Medication management - Magnesium  9. Insulin resistance - Hemoglobin A1c - Insulin, fasting  10. Overweight Obesity with co morbidities- long discussion about weight loss, diet, and exercise  11. Marijuana use Advised to quit smoking  12. DDD (degenerative disc disease), lumbar RICE, NSAIDS, exercises given, if not better get xray and PT referral or ortho referral.   13. Hiatal hernia  14. Globus sensation No weight loss, fever, chills, will treat GERD with nexium samples, but with smoking history will refer to ENT for eval.  - Ambulatory referral to ENT  15. Vitamin D deficiency - Vit D  25 hydroxy (rtn osteoporosis  monitoring)  16. Thiamin deficiency - Vitamin B12  17. Anemia, unspecified anemia type - Iron and TIBC - Ferritin  18. Screening PSA (prostate specific antigen) - PSA  Discussed med's effects and SE's. Screening labs and tests as requested with regular follow-up as recommended.  HPI Patient presents for a complete physical.    His blood pressure has been controlled at home, today their BP is BP: 140/70 mmHg He does not workout. He denies chest pain, shortness of breath, dizziness.  He is on cholesterol medication, fenofibrate and denies myalgias. His cholesterol is not at goal. The cholesterol last visit was:   Lab Results  Component Value Date   CHOL 177 06/06/2014   HDL 29* 06/06/2014   LDLCALC 122* 06/06/2014   TRIG 130 06/06/2014   CHOLHDL 6.1 06/06/2014    He has been working on diet and exercise for prediabetes, and denies paresthesia of the feet, polydipsia and polyuria. Last A1C in the office was:  Lab Results  Component Value Date   HGBA1C 5.3 06/06/2014   Patient is on Vitamin D supplement.   Lab Results  Component Value Date   VD25OH 21* 06/06/2014     Last PSA is  Lab Results  Component Value Date   PSA 1.14 02/01/2014   He smokes and has sarcoidosis, follows with Dr. Shelle Iron and has seen Dr. Swaziland in the past.  He has globulus sensation on his left side, has some reflux symptoms.  He did have low thimine and has been on B complex.  He has a history of hepatitis C, follows with Dr. Lucas Mallow, treated on 04/22/2014, Harvoni, and he has decreased his beer drinking, has been drinking Odoul's. Has had Hep B vaccine.  BMI is  Body mass index is 30.81 kg/(m^2)., he is working on diet and exercise. Wt Readings from Last 3 Encounters:  02/07/15 224 lb (101.606 kg)  07/08/14 220 lb (99.791 kg)  06/28/14 219 lb (99.338 kg)     Current Medications:  Current Outpatient Prescriptions on File Prior to Visit  Medication Sig Dispense Refill  . B Complex-Biotin-FA  (SUPER B-100 PO) Take 1 tablet by mouth daily.    . fenofibrate micronized (LOFIBRA) 134 MG capsule TAKE 1 CAPSULE (134 MG TOTAL) BY MOUTH DAILY BEFORE BREAKFAST. 90 capsule 1  . fenofibrate micronized (LOFIBRA) 134 MG capsule TAKE 1 CAPSULE (134 MG TOTAL) BY MOUTH DAILY BEFORE BREAKFAST. 90 capsule 0  . meloxicam (MOBIC) 15 MG tablet Take once daily as needed daily with food for pain, do not take aleve/ibuprofen with it, can take tylenol 90 tablet 0  . metFORMIN (GLUCOPHAGE XR) 500 MG 24 hr tablet Take 1 to 2 tablets 2 x day for Diabetes 360 tablet 1  . Omega-3 Fatty Acids (FISH OIL PO) Take 1 capsule by mouth daily.    [name] VITAMIN D, ERGOCALCIFEROL, PO Take 1 tablet by mouth daily.    [name] gabapentin (NEURONTIN) 300 MG capsule Can take 1 pill with breakfast, 1 pill at lunch and 2 pills at dinner for pain as needed. 360 capsule 0   No current facility-administered medications on file prior to visit.   Health Maintenance:  Immunization History  Administered Date(s) Administered  . Hepatitis B 06/24/2014  . Influenza Whole 01/17/2012  . Pneumococcal Conjugate-13 12/28/2011  . Pneumococcal Polysaccharide-23 10/03/2012  . Tdap 10/04/2011    Tetanus: 09/2011 Pneumovax: 2014 Prevnar 13: 2013 Flu vaccine: will get at [facility] Zostavax: Hep B: 2016 DEXA: Colonoscopy: 12/2013 Dr. Christella Hartigan EGD: Heart cath 04/2013  Allergies:  Allergies  Allergen Reactions  . Chantix [Varenicline]     Nausea  . Lopid [Gemfibrozil]     Nausea/weakness   Medical History:  Past Medical History  Diagnosis Date  . Sarcoid (HCC)   . Hepatitis C   . Diabetes mellitus without complication (HCC)   . Hypercholesteremia   . Tobacco abuse   . Hiatal hernia   . Marijuana use   . Chest pain 05/16/2012  . Thrombocytopenia (HCC)   . Osteoarthritis   . Colon polyps   . DDD (degenerative disc disease)    Surgical History:  Past Surgical History  Procedure Laterality Date  . Appendectomy    . Head trauma  1975  .  Left heart catheterization with coronary angiogram N/A 05/17/2012    Procedure: LEFT HEART CATHETERIZATION WITH CORONARY ANGIOGRAM;  Surgeon: Peter M Swaziland, MD;  Location: [facility] CATH LAB;  Service: Cardiovascular;  Laterality: N/A;   Family History:  Family History  Problem Relation Age of Onset  . Leukemia Mother   . Diabetes Mellitus II Mother   . Other      No family h/o CAD  . Cancer Father     prostate  . Colon cancer Neg Hx   . Pancreatic cancer Neg Hx   . Rectal cancer Neg Hx   . Stomach cancer Neg Hx    Social History:   Social History  Substance Use Topics  . Smoking status: Current Every Day Smoker -- 0.50 packs/day for 30 years    Types: Cigarettes  . Smokeless tobacco: Never Used     Comment: " i AM WORKING ON QUITTING MYSELF  . Alcohol Use: 3.6 oz/week    6 Cans of beer per  week     Comment: weekly   Review of Systems  Constitutional: Negative.  Negative for fever, chills, weight loss, malaise/fatigue and diaphoresis.  HENT: Positive for sore throat (globulus sensation left side). Negative for congestion, ear discharge, ear pain, hearing loss, nosebleeds and tinnitus.   Eyes: Negative.   Respiratory: Negative.  Negative for shortness of breath and stridor.   Cardiovascular: Negative.   Gastrointestinal: Negative.   Genitourinary: Negative.   Musculoskeletal: Positive for back pain, joint pain and neck pain. Negative for myalgias and falls.  Skin: Negative.   Neurological: Negative.  Negative for weakness and headaches.  Psychiatric/Behavioral: Negative.      Physical Exam: Estimated body mass index is 30.81 kg/(m^2) as calculated from the following:   Height as of this encounter: 5' 11.5" (1.816 m).   Weight as of this encounter: 224 lb (101.606 kg). BP 140/70 mmHg  Pulse 89  Temp(Src) 98.1 F (36.7 C) (Temporal)  Resp 14  Ht 5' 11.5" (1.816 m)  Wt 224 lb (101.606 kg)  BMI 30.81 kg/m2  SpO2 96% General Appearance: Obese, in no apparent distress.   Eyes: PERRLA, EOMs, conjunctiva no swelling or erythema, normal fundi and vessels.  Sinuses: No Frontal/maxillary tenderness  ENT/Mouth: Ext aud canals clear,  Crowded mouth, normal light reflex with TMs without erythema, bulging. Good dentition. No erythema, swelling, or exudate on post pharynx. Tonsils not swollen or erythematous. Hearing normal.  Neck: Supple, thyroid normal. No bruits  Respiratory: Respiratory effort normal, BS equal bilaterally without rales, rhonchi, wheezing or stridor.  Cardio: RRR without murmurs, rubs or gallops. Brisk peripheral pulses without edema.  Chest: symmetric, with normal excursions and percussion.  Abdomen: Soft, nontender, obese no guarding, rebound, hernias, masses, or organomegaly. .  Lymphatics: Non tender without lymphadenopathy.  Genitourinary: defer Musculoskeletal: Full ROM all peripheral extremities,5/5 strength, and normal gait.  Skin: Warm, dry without rashes, lesions, ecchymosis. Neuro: Cranial nerves intact, reflexes equal bilaterally. Normal muscle tone, no cerebellar symptoms. Psych: Awake and oriented X 3, normal affect, Insight and Judgment appropriate.   EKG: WNL no changes. Aorta scan normal  Quentin MullingAmanda Charlet Harr 10:14 AM [facility]

## 2015-02-08 LAB — BASIC METABOLIC PANEL WITH GFR
BUN: 18 mg/dL (ref 7–25)
CHLORIDE: 105 mmol/L (ref 98–110)
CO2: 24 mmol/L (ref 20–31)
Calcium: 9.5 mg/dL (ref 8.6–10.3)
Creat: 0.92 mg/dL (ref 0.70–1.33)
GFR, Est African American: >89 mL/min (ref >=60)
Glucose, Bld: 100 mg/dL — ABNORMAL HIGH (ref 65–99)
POTASSIUM: 4 mmol/L (ref 3.5–5.3)
SODIUM: 140 mmol/L (ref 135–146)

## 2015-02-08 LAB — PSA: PSA: 1.2 ng/mL (ref <=4.00)

## 2015-02-08 LAB — MAGNESIUM: Magnesium: 2 mg/dL (ref 1.5–2.5)

## 2015-02-08 LAB — VITAMIN B12: Vitamin B-12: 474 pg/mL (ref 211–911)

## 2015-02-08 LAB — HEPATIC FUNCTION PANEL
ALK PHOS: 77 U/L (ref 40–115)
ALT: 16 U/L (ref 9–46)
AST: 19 U/L (ref 10–35)
Albumin: 4 g/dL (ref 3.6–5.1)
BILIRUBIN DIRECT: 0.2 mg/dL (ref <=0.2)
BILIRUBIN INDIRECT: 0.2 mg/dL (ref 0.2–1.2)
BILIRUBIN TOTAL: 0.4 mg/dL (ref 0.2–1.2)
Total Protein: 6.8 g/dL (ref 6.1–8.1)

## 2015-02-08 LAB — URINALYSIS, ROUTINE W REFLEX MICROSCOPIC
Bilirubin Urine: NEGATIVE
Glucose, UA: NEGATIVE
Hgb urine dipstick: NEGATIVE
Ketones, ur: NEGATIVE
LEUKOCYTES UA: NEGATIVE
NITRITE: NEGATIVE
PROTEIN: NEGATIVE
SPECIFIC GRAVITY, URINE: 1.023 (ref 1.001–1.035)
pH: 5 (ref 5.0–8.0)

## 2015-02-08 LAB — MICROALBUMIN / CREATININE URINE RATIO
CREATININE, URINE: 136 mg/dL (ref 20–370)
MICROALB UR: 1.1 mg/dL
MICROALB/CREAT RATIO: 8 ug/mg{creat} (ref <30)

## 2015-02-08 LAB — LIPID PANEL
CHOL/HDL RATIO: 7.8 ratio — AB (ref <=5.0)
Cholesterol: 172 mg/dL (ref 125–200)
HDL: 22 mg/dL — AB (ref >=40)
LDL CALC: 84 mg/dL (ref <130)
Triglycerides: 328 mg/dL — ABNORMAL HIGH (ref <150)
VLDL: 66 mg/dL — AB (ref <30)

## 2015-02-08 LAB — FERRITIN: FERRITIN: 193 ng/mL (ref 22–322)

## 2015-02-08 LAB — IRON AND TIBC
%SAT: 15 % (ref 15–60)
IRON: 61 ug/dL (ref 50–180)
TIBC: 411 ug/dL (ref 250–425)
UIBC: 350 ug/dL (ref 125–400)

## 2015-02-08 LAB — INSULIN, FASTING: Insulin fasting, serum: 97.8 u[IU]/mL — ABNORMAL HIGH (ref 2.0–19.6)

## 2015-02-08 LAB — TSH: TSH: 0.959 u[IU]/mL (ref 0.350–4.500)

## 2015-02-08 LAB — VITAMIN D 25 HYDROXY (VIT D DEFICIENCY, FRACTURES): VIT D 25 HYDROXY: 17 ng/mL — AB (ref 30–100)

## 2015-04-24 ENCOUNTER — Other Ambulatory Visit: Payer: Self-pay | Admitting: Internal Medicine

## 2015-05-02 ENCOUNTER — Ambulatory Visit (INDEPENDENT_AMBULATORY_CARE_PROVIDER_SITE_OTHER): Payer: Managed Care, Other (non HMO) | Admitting: Internal Medicine

## 2015-05-02 ENCOUNTER — Encounter: Payer: Self-pay | Admitting: Internal Medicine

## 2015-05-02 VITALS — BP 128/68 | HR 92 | Temp 98.2°F | Resp 18 | Ht 71.5 in | Wt 222.0 lb

## 2015-05-02 DIAGNOSIS — J069 Acute upper respiratory infection, unspecified: Secondary | ICD-10-CM | POA: Diagnosis not present

## 2015-05-02 DIAGNOSIS — Z720 Tobacco use: Secondary | ICD-10-CM

## 2015-05-02 MED ORDER — ONDANSETRON HCL 4 MG PO TABS: 4.0000 mg | ORAL_TABLET | Freq: Every day | ORAL | Status: DC | PRN

## 2015-05-02 MED ORDER — PREDNISONE 20 MG PO TABS: ORAL_TABLET | ORAL | Status: DC

## 2015-05-02 MED ORDER — ALBUTEROL SULFATE HFA 108 (90 BASE) MCG/ACT IN AERS: 2.0000 | INHALATION_SPRAY | RESPIRATORY_TRACT | Status: DC | PRN

## 2015-05-02 NOTE — Progress Notes (Signed)
Patient ID: Jerry Stewart, male   DOB: [birth year], 58 y.o.   MRN: [id number]  HPI  Patient presents to the office for evaluation of cough, nausea, and sinus congestion.  It has been going on for 4 days.  Patient reports dry cough.  They also endorse shortness of breath, wheezing and nasal congestion, fatigue, mild nausea.  .  They have tried nyquil and nyquil sinus.  They report that nothing has worked.  They denies other sick contacts.  He has been smoking a lot lately.  He reports that he is doing at least a pack per day.   Review of Systems  Constitutional: Positive for malaise/fatigue. Negative for fever and chills.  HENT: Positive for congestion. Negative for ear pain and sore throat.   Respiratory: Positive for cough and shortness of breath. Negative for sputum production and wheezing.   Cardiovascular: Negative for chest pain, palpitations and leg swelling.  Neurological: Negative for headaches.    PE:  Filed Vitals:   05/02/15 1035  BP: 128/68  Pulse: 92  Temp: 98.2 F (36.8 C)  Resp: 18    General:  Alert and non-toxic, WDWN, NAD HEENT: NCAT, PERLA, EOM normal, no occular discharge or erythema.  Nasal mucosal edema with sinus tenderness to palpation.  Oropharynx clear with minimal oropharyngeal edema and erythema.  Mucous membranes moist and pink. Neck:  Cervical adenopathy Chest:  RRR no MRGs.  Lungs clear to auscultation A&P with no wheezes rhonchi or rales.   Abdomen: +BS x 4 quadrants, soft, non-tender, no guarding, rigidity, or rebound. Skin: warm and dry no rash Neuro: A&Ox4, CN II-XII grossly intact  Assessment and Plan:   1. Acute URI -anoro -albuterol -antihistamine -zofran  2. Tobacco abuse -recommended quitting smoking

## 2015-05-02 NOTE — Patient Instructions (Addendum)
Please take anoro inhaler daily.  Please make sure you take this medication daily and wash your mouth out after you use it.   Please make sure you use albuterol as needed for when you are very short of breath.  Please take prednisone until it is gone.  Please take claritin, zyrtec or allegra daily to help with congestion.  Please take zofran as needed for nausea and queasiness.

## 2015-07-20 ENCOUNTER — Other Ambulatory Visit: Payer: Self-pay | Admitting: Internal Medicine

## 2015-07-22 ENCOUNTER — Other Ambulatory Visit: Payer: Self-pay

## 2015-07-22 MED ORDER — GLUCOSE BLOOD VI STRP: ORAL_STRIP | Status: DC

## 2015-07-22 MED ORDER — FREESTYLE LANCETS MISC: Status: DC

## 2015-07-23 ENCOUNTER — Other Ambulatory Visit: Payer: Self-pay

## 2015-08-06 ENCOUNTER — Ambulatory Visit (INDEPENDENT_AMBULATORY_CARE_PROVIDER_SITE_OTHER): Payer: Managed Care, Other (non HMO) | Admitting: Internal Medicine

## 2015-08-06 ENCOUNTER — Encounter: Payer: Self-pay | Admitting: Gastroenterology

## 2015-08-06 ENCOUNTER — Encounter: Payer: Self-pay | Admitting: Internal Medicine

## 2015-08-06 VITALS — BP 130/80 | HR 99 | Temp 97.5°F | Resp 16 | Ht 71.5 in | Wt 222.8 lb

## 2015-08-06 DIAGNOSIS — R21 Rash and other nonspecific skin eruption: Secondary | ICD-10-CM | POA: Diagnosis not present

## 2015-08-06 MED ORDER — CLOTRIMAZOLE-BETAMETHASONE 1-0.05 % EX CREA: 1.0000 "application " | TOPICAL_CREAM | Freq: Two times a day (BID) | CUTANEOUS | Status: DC

## 2015-08-06 MED ORDER — PREDNISONE 20 MG PO TABS: ORAL_TABLET | ORAL | Status: DC

## 2015-08-06 NOTE — Progress Notes (Signed)
   Subjective:    Patient ID: Jerry Stewart, male    DOB: 1955-07-23, 60 y.o.   MRN: [id number]  HPI  Patient presents to the office for evaluation of left sided burning and itching.  He reports that this has been going on for approximately 3 months.  It is constant.  He reports that he has tried changing soaps, calamine lotion and nothing has helped it.  No history of eczema or rashes.  His son does have a history of it.  He reports that nothing makes it worse.  He has no injury to the skin he can think of.  He had chicken pox when he was a child.  He has never had shingles that he is aware of.    Review of Systems  Constitutional: Negative for fever, chills and fatigue.  Gastrointestinal: Negative for nausea and vomiting.  Skin: Positive for color change. Negative for pallor, rash and wound.       Objective:   Physical Exam  Constitutional: He is oriented to person, place, and time. He appears well-developed and well-nourished. No distress.  HENT:  Head: Normocephalic.  Mouth/Throat: No oropharyngeal exudate.  Eyes: Conjunctivae are normal. No scleral icterus.  Neck: Normal range of motion. Neck supple. No JVD present. No thyromegaly present.  Cardiovascular: Normal rate, regular rhythm, normal heart sounds and intact distal pulses.  Exam reveals no gallop and no friction rub.   No murmur heard. Pulmonary/Chest: Effort normal and breath sounds normal. No respiratory distress. He has no wheezes. He has no rales. He exhibits no tenderness.  Musculoskeletal: Normal range of motion.  Lymphadenopathy:    He has no cervical adenopathy.  Neurological: He is alert and oriented to person, place, and time.  Skin: He is not diaphoretic.     Nursing note and vitals reviewed.   Filed Vitals:   08/06/15 1507  BP: 130/80  Pulse: 99  Temp: 97.5 F (36.4 C)  Resp: 16          Assessment & Plan:   1. Rash and nonspecific skin eruption -tinea versicolor vs. Shingles  -no visible  deformities -lotrisone cream -selsun blue -prednisone -given duration doubt that valtrex will be helpul -patient to call if no relief.

## 2015-08-06 NOTE — Patient Instructions (Signed)
Please use antifungal shampoo like selsun blue or dandruff shampoo to help keep yeast off of your skin.  Please take the prednisone until it is all the way gone.   Please use the cream twice daily.  Please take claritin, zyrtec or allegra daily to help with some of the itching.    If itching is really bad at nighttime you can also take benadryl.

## 2015-08-27 ENCOUNTER — Ambulatory Visit (INDEPENDENT_AMBULATORY_CARE_PROVIDER_SITE_OTHER): Payer: Managed Care, Other (non HMO) | Admitting: Internal Medicine

## 2015-08-27 ENCOUNTER — Encounter: Payer: Self-pay | Admitting: Internal Medicine

## 2015-08-27 VITALS — BP 126/78 | HR 98 | Temp 98.2°F | Resp 18 | Ht 71.5 in | Wt 219.0 lb

## 2015-08-27 DIAGNOSIS — R21 Rash and other nonspecific skin eruption: Secondary | ICD-10-CM | POA: Diagnosis not present

## 2015-08-27 MED ORDER — DOXYCYCLINE HYCLATE 100 MG PO CAPS: 100.0000 mg | ORAL_CAPSULE | Freq: Two times a day (BID) | ORAL | Status: DC

## 2015-08-27 MED ORDER — TRIAMCINOLONE ACETONIDE 0.1 % EX CREA: 1.0000 "application " | TOPICAL_CREAM | Freq: Three times a day (TID) | CUTANEOUS | Status: DC

## 2015-08-27 NOTE — Progress Notes (Signed)
   Subjective:    Patient ID: Jerry Stewart, male    DOB: [birth year], 60 y.o.   MRN: [id number]  Rash Associated symptoms include shortness of breath. Pertinent negatives include no diarrhea, fatigue, fever or vomiting.   Patient presents to the office for evaluation of a generalized rash on his arms and his legs x 3 days.  He reports that the rash is very itchy.  He notes that he was exposed to some poison ivy a couple days ago but those spots are improving.  No new over the counter medications, no new foods, no new lotion or soap, no changes in laundry detergent or fabric softener, no new clothes, no travel anywhere.  Nobody else has a rash.  He reports that he is very allergic to poison ivy.  No bugs that he has identified.  He was recently seen for pain in his back and that did not help.  Prednisone recently did not help either.  He has not been bitten by a tick to his knowledge.     Review of Systems  Constitutional: Negative for fever, chills and fatigue.  Respiratory: Positive for shortness of breath. Negative for chest tightness.   Cardiovascular: Negative for chest pain and palpitations.  Gastrointestinal: Negative for nausea, vomiting, abdominal pain, diarrhea and constipation.  Skin: Positive for rash.       Objective:   Physical Exam  Constitutional: He appears well-developed and well-nourished. No distress.  HENT:  Head: Normocephalic.  Mouth/Throat: Oropharynx is clear and moist. No oropharyngeal exudate.  Eyes: Conjunctivae are normal. No scleral icterus.  Neck: Normal range of motion. Neck supple. No JVD present. No thyromegaly present.  Cardiovascular: Normal rate, regular rhythm, normal heart sounds and intact distal pulses.  Exam reveals no gallop and no friction rub.   No murmur heard. Pulmonary/Chest: Effort normal and breath sounds normal. No respiratory distress. He has no wheezes. He has no rales. He exhibits no tenderness.  Lymphadenopathy:    He has no cervical  adenopathy.  Skin: Skin is warm and dry. Rash noted. He is not diaphoretic.  Diffuse generalized papular erythematous rash across the bilateral arms and legs.  NO pustules, drainage, or tenderness to palpation.  NO macular redness.  Mild excoriation present.    Psychiatric: He has a normal mood and affect. His behavior is normal. Judgment and thought content normal.  Nursing note and vitals reviewed.   Filed Vitals:   08/27/15 1058  BP: 126/78  Pulse: 98  Temp: 98.2 F (36.8 C)  Resp: 18         Assessment & Plan:    1. Rash and nonspecific skin eruption -folliculitis vs. Possible cutaneous version of sarcoidosis.   -kenalog cream and doxycyline to treat as if this is folliculitiis.  Patient to keep skin clean and dry.  If not improved by Monday recommend going to see dermatology.  Patient reports that his sister who also has sarcoidosis has had a very similar rash in the past related to her sarcoid.

## 2015-08-27 NOTE — Patient Instructions (Signed)
Folliculitis °Folliculitis is redness, soreness, and swelling (inflammation) of the hair follicles. This condition can occur anywhere on the body. People with weakened immune systems, diabetes, or obesity have a greater risk of getting folliculitis. °CAUSES °· Bacterial infection. This is the most common cause. °· Fungal infection. °· Viral infection. °· Contact with certain chemicals, especially oils and tars. °Long-term folliculitis can result from bacteria that live in the nostrils. The bacteria may trigger multiple outbreaks of folliculitis over time. °SYMPTOMS °Folliculitis most commonly occurs on the scalp, thighs, legs, back, buttocks, and areas where hair is shaved frequently. An early sign of folliculitis is a small, white or yellow, pus-filled, itchy lesion (pustule). These lesions appear on a red, inflamed follicle. They are usually less than 0.2 inches (5 mm) wide. When there is an infection of the follicle that goes deeper, it becomes a boil or furuncle. A group of closely packed boils creates a larger lesion (carbuncle). Carbuncles tend to occur in hairy, sweaty areas of the body. °DIAGNOSIS  °Your caregiver can usually tell what is wrong by doing a physical exam. A sample may be taken from one of the lesions and tested in a lab. This can help determine what is causing your folliculitis. °TREATMENT  °Treatment may include: °· Applying warm compresses to the affected areas. °· Taking antibiotic medicines orally or applying them to the skin. °· Draining the lesions if they contain a large amount of pus or fluid. °· Laser hair removal for cases of long-lasting folliculitis. This helps to prevent regrowth of the hair. °HOME CARE INSTRUCTIONS °· Apply warm compresses to the affected areas as directed by your caregiver. °· If antibiotics are prescribed, take them as directed. Finish them even if you start to feel better. °· You may take over-the-counter medicines to relieve itching. °· Do not shave irritated  skin. °· Follow up with your caregiver as directed. °SEEK IMMEDIATE MEDICAL CARE IF:  °· You have increasing redness, swelling, or pain in the affected area. °· You have a fever. °MAKE SURE YOU: °· Understand these instructions. °· Will watch your condition. °· Will get help right away if you are not doing well or get worse. °  °This information is not intended to replace advice given to you by your health care provider. Make sure you discuss any questions you have with your health care provider. °  °Document Released: 05/24/2001 Document Revised: 04/05/2014 Document Reviewed: 06/15/2011 °[employer]vier Interactive Patient Education ©2016 [employer] ° °

## 2015-09-16 ENCOUNTER — Encounter: Payer: Self-pay | Admitting: Physician Assistant

## 2015-09-16 ENCOUNTER — Ambulatory Visit (INDEPENDENT_AMBULATORY_CARE_PROVIDER_SITE_OTHER): Payer: Managed Care, Other (non HMO) | Admitting: Physician Assistant

## 2015-09-16 VITALS — BP 140/90 | HR 87 | Temp 97.7°F | Resp 16 | Ht 71.5 in | Wt 225.4 lb

## 2015-09-16 DIAGNOSIS — F330 Major depressive disorder, recurrent, mild: Secondary | ICD-10-CM | POA: Diagnosis not present

## 2015-09-16 DIAGNOSIS — D696 Thrombocytopenia, unspecified: Secondary | ICD-10-CM | POA: Diagnosis not present

## 2015-09-16 DIAGNOSIS — Z720 Tobacco use: Secondary | ICD-10-CM

## 2015-09-16 DIAGNOSIS — Z79899 Other long term (current) drug therapy: Secondary | ICD-10-CM

## 2015-09-16 DIAGNOSIS — E663 Overweight: Secondary | ICD-10-CM

## 2015-09-16 DIAGNOSIS — E785 Hyperlipidemia, unspecified: Secondary | ICD-10-CM

## 2015-09-16 DIAGNOSIS — D869 Sarcoidosis, unspecified: Secondary | ICD-10-CM

## 2015-09-16 DIAGNOSIS — I10 Essential (primary) hypertension: Secondary | ICD-10-CM | POA: Diagnosis not present

## 2015-09-16 DIAGNOSIS — E559 Vitamin D deficiency, unspecified: Secondary | ICD-10-CM | POA: Diagnosis not present

## 2015-09-16 DIAGNOSIS — R7309 Other abnormal glucose: Secondary | ICD-10-CM | POA: Diagnosis not present

## 2015-09-16 LAB — CBC WITH DIFFERENTIAL/PLATELET
BASOS ABS: 89 {cells}/uL (ref 0–200)
Basophils Relative: 1 %
EOS ABS: 178 {cells}/uL (ref 15–500)
Eosinophils Relative: 2 %
HEMATOCRIT: 45.2 % (ref 38.5–50.0)
Hemoglobin: 15 g/dL (ref 13.2–17.1)
LYMPHS PCT: 22 %
Lymphs Abs: 1958 cells/uL (ref 850–3900)
MCH: 28.5 pg (ref 27.0–33.0)
MCHC: 33.2 g/dL (ref 32.0–36.0)
MCV: 85.9 fL (ref 80.0–100.0)
MONOS PCT: 6 %
MPV: 10.3 fL (ref 7.5–12.5)
Monocytes Absolute: 534 cells/uL (ref 200–950)
NEUTROS ABS: 6141 {cells}/uL (ref 1500–7800)
Neutrophils Relative %: 69 %
PLATELETS: 239 10*3/uL (ref 140–400)
RBC: 5.26 MIL/uL (ref 4.20–5.80)
RDW: 13.7 % (ref 11.0–15.0)
WBC: 8.9 10*3/uL (ref 3.8–10.8)

## 2015-09-16 MED ORDER — BUPROPION HCL ER (XL) 150 MG PO TB24: 150.0000 mg | ORAL_TABLET | ORAL | Status: DC

## 2015-09-16 NOTE — Progress Notes (Signed)
3 month and depression  Assessment and Plan: 1. Essential hypertension - continue medications, DASH diet, exercise and monitor at home. Call if greater than 130/80.  - BASIC METABOLIC PANEL WITH GFR - Hepatic function panel - TSH  2. Thrombocytopenia (HCC) Check CBC - CBC with Differential/Platelet  3. Sarcoidosis (HCC) Continue follow up pulmonary  4. Overweight Obesity with co morbidities- long discussion about weight loss, diet, and exercise  5. Medication management - Magnesium  6. Hyperlipidemia - check lipids, decrease fatty foods, increase activity.  - Lipid panel  7. Tobacco abuse Advised/counseled to quit smoking  8. Depression, major, recurrent, mild (HCC) Depression- start medication, stress management techniques discussed, increase water, good sleep hygiene discussed, increase exercise, and increase veggies.  Denies SI/HI Counseling advised, follow up 1 month - buPROPion (WELLBUTRIN XL) 150 MG 24 hr tablet; Take 1 tablet (150 mg total) by mouth every morning.  Dispense: 30 tablet; Refill: 2  9. Other abnormal glucose - Hemoglobin A1c  10. Vitamin D deficiency - VITAMIN D 25 Hydroxy (Vit-D Deficiency, Fractures)  Discussed med's effects and SE's. Screening labs and tests as requested with regular follow-up as recommended. Future Appointments Date Time Provider Department Center  02/09/2016 9:00 AM Quentin MullingAmanda Collier, PA-C [facility] None   HPI Patient presents for 3 month follow up for HTN, chol, sarcoidosis, and acute visit for depression.   He states he has been under stress, depressed for past year or two, getting worse. He has 58 year old son that is not working/pushing himself, he is taking care of one of his grand babies.    His blood pressure has been controlled at home, today their BP is BP: 140/90 mmHg He does not workout. He denies chest pain, shortness of breath, dizziness.  He is on cholesterol medication, fenofibrate and denies myalgias. His  cholesterol is not at goal. The cholesterol last visit was:   Lab Results  Component Value Date   CHOL 172 02/07/2015   HDL 22* 02/07/2015   LDLCALC 84 02/07/2015   TRIG 328* 02/07/2015   CHOLHDL 7.8* 02/07/2015    He has been working on diet and exercise for prediabetes, and denies paresthesia of the feet, polydipsia and polyuria. Last A1C in the office was:  Lab Results  Component Value Date   HGBA1C 5.2 02/07/2015   Patient is on Vitamin D supplement.   Lab Results  Component Value Date   VD25OH 17* 02/07/2015     He smokes and has sarcoidosis, follows with Dr. Shelle Ironlance and has seen Dr. [place] in the past.   He has a history of hepatitis C, follows with Dr. Lucas MallowZamar, treated on 04/22/2014, [facility], and he has decreased his beer drinking, has been drinking Odoul's. Has had Hep B vaccine.  BMI is Body mass index is 31 kg/(m^2)., he is working on diet and exercise. Wt Readings from Last 3 Encounters:  09/16/15 225 lb 6.4 oz (102.241 kg)  08/27/15 219 lb (99.338 kg)  08/06/15 222 lb 12.8 oz (101.061 kg)     Current Medications:  Current Outpatient Prescriptions on File Prior to Visit  Medication Sig Dispense Refill  . esomeprazole (NEXIUM) 20 MG capsule Take 1 capsule (20 mg total) by mouth daily. 30 capsule 0  . fenofibrate micronized (LOFIBRA) 134 MG capsule TAKE 1 CAPSULE (134 MG TOTAL) BY MOUTH DAILY BEFORE BREAKFAST. 90 capsule 0  . glucose blood (FREESTYLE LITE) test strip Use as instructed Dx code: E11.9 100 each 5  . Lancets (FREESTYLE) lancets Use as  instructed Dx code:E11.9 100 each 5  . metFORMIN (GLUCOPHAGE-XR) 500 MG 24 hr tablet TAKE 1 TO 2 TABLETS TWICE A DAY FOR DIABETES 360 tablet 1  . triamcinolone cream (KENALOG) 0.1 % Apply 1 application topically 3 (three) times daily. 45 g 0   No current facility-administered medications on file prior to visit.   He has Sarcoidosis (HCC); Hyperlipidemia; Overweight; Hepatitis C; Prediabetes; Tobacco abuse; Hiatal hernia;  Marijuana use; Thrombocytopenia (HCC); Osteoarthritis; Colon polyps; DDD (degenerative disc disease), lumbar; Essential hypertension; Insulin resistance; and Medication management on his problem list.  Allergies:  Allergies  Allergen Reactions  . Chantix [Varenicline]     Nausea  . Lopid [Gemfibrozil]     Nausea/weakness   Surgical History: reviewed Family History: reviewed Social History:  Reviewed  Review of Systems  Constitutional: Negative.  Negative for fever, chills, weight loss, malaise/fatigue and diaphoresis.  HENT: Negative for congestion, ear discharge, ear pain, hearing loss, nosebleeds, sore throat and tinnitus.   Eyes: Negative.   Respiratory: Negative.  Negative for shortness of breath and stridor.   Cardiovascular: Negative.   Gastrointestinal: Negative.   Genitourinary: Negative.   Musculoskeletal: Positive for back pain, joint pain and neck pain. Negative for myalgias and falls.  Skin: Negative.   Neurological: Negative.  Negative for weakness and headaches.  Psychiatric/Behavioral: Positive for depression (decreased appetite, does not want to go out, has increased drinking, increase sleeping) and substance abuse. Negative for suicidal ideas, hallucinations and memory loss. The patient is nervous/anxious and has insomnia.     Physical Exam: Estimated body mass index is 31 kg/(m^2) as calculated from the following:   Height as of this encounter: 5' 11.5" (1.816 m).   Weight as of this encounter: 225 lb 6.4 oz (102.241 kg). BP 140/90 mmHg  Pulse 87  Temp(Src) 97.7 F (36.5 C) (Temporal)  Resp 16  Ht 5' 11.5" (1.816 m)  Wt 225 lb 6.4 oz (102.241 kg)  BMI 31.00 kg/m2  SpO2 96% General Appearance: Obese, in no apparent distress.  Eyes: PERRLA, EOMs, conjunctiva no swelling or erythema, normal fundi and vessels.  Sinuses: No Frontal/maxillary tenderness  ENT/Mouth: Ext aud canals clear,  Crowded mouth, normal light reflex with TMs without erythema, bulging.  Good dentition. No erythema, swelling, or exudate on post pharynx. Tonsils not swollen or erythematous. Hearing normal.  Neck: Supple, thyroid normal. No bruits  Respiratory: Respiratory effort normal, BS equal bilaterally without rales, rhonchi, wheezing or stridor.  Cardio: RRR without murmurs, rubs or gallops. Brisk peripheral pulses without edema.  Chest: symmetric, with normal excursions and percussion.  Abdomen: Soft, nontender, obese no guarding, rebound, hernias, masses, or organomegaly. .  Lymphatics: Non tender without lymphadenopathy.  Musculoskeletal: Full ROM all peripheral extremities,5/5 strength, and normal gait.  Skin: Warm, dry without rashes, lesions, ecchymosis. Neuro: Cranial nerves intact, reflexes equal bilaterally. Normal muscle tone, no cerebellar symptoms. Psych: Awake and oriented X 3, normal affect, Insight and Judgment appropriate.   Quentin Mulling 3:45 PM [facility]

## 2015-09-16 NOTE — Patient Instructions (Addendum)
Get back on vitamin D 5000 IU and a b complex once a day Start wellbutrin with food in the morning  Major Depressive Disorder Major depressive disorder is a mental illness. It also may be called clinical depression or unipolar depression. Major depressive disorder usually causes feelings of sadness, hopelessness, or helplessness. Some people with this disorder do not feel particularly sad but lose interest in doing things they used to enjoy (anhedonia). Major depressive disorder also can cause physical symptoms. It can interfere with work, school, relationships, and other normal everyday activities. The disorder varies in severity but is longer lasting and more serious than the sadness we all feel from time to time in our lives. Major depressive disorder often is triggered by stressful life events or major life changes. Examples of these triggers include divorce, loss of your job or home, a move, and the death of a family member or close friend. Sometimes this disorder occurs for no obvious reason at all. People who have family members with major depressive disorder or bipolar disorder are at higher risk for developing this disorder, with or without life stressors. Major depressive disorder can occur at any age. It may occur just once in your life (single episode major depressive disorder). It may occur multiple times (recurrent major depressive disorder). SYMPTOMS People with major depressive disorder have either anhedonia or depressed mood on nearly a daily basis for at least 2 weeks or longer. Symptoms of depressed mood include:  Feelings of sadness (blue or down in the dumps) or emptiness.  Feelings of hopelessness or helplessness.  Tearfulness or episodes of crying (may be observed by others).  Irritability (children and adolescents). In addition to depressed mood or anhedonia or both, people with this disorder have at least four of the following symptoms:  Difficulty sleeping or sleeping too  much.   Significant change (increase or decrease) in appetite or weight.   Lack of energy or motivation.  Feelings of guilt and worthlessness.   Difficulty concentrating, remembering, or making decisions.  Unusually slow movement (psychomotor retardation) or restlessness (as observed by others).   Recurrent wishes for death, recurrent thoughts of self-harm (suicide), or a suicide attempt. People with major depressive disorder commonly have persistent negative thoughts about themselves, other people, and the world. People with severe major depressive disorder may experiencedistorted beliefs or perceptions about the world (psychotic delusions). They also may see or hear things that are not real (psychotic hallucinations). DIAGNOSIS Major depressive disorder is diagnosed through an assessment by your health care provider. Your health care provider will ask aboutaspects of your daily life, such as mood,sleep, and appetite, to see if you have the diagnostic symptoms of major depressive disorder. Your health care provider may ask about your medical history and use of alcohol or drugs, including prescription medicines. Your health care provider also may do a physical exam and blood work. This is because certain medical conditions and the use of certain substances can cause major depressive disorder-like symptoms (secondary depression). Your health care provider also may refer you to a mental health specialist for further evaluation and treatment. TREATMENT It is important to recognize the symptoms of major depressive disorder and seek treatment. The following treatments can be prescribed for this disorder:   Medicine. Antidepressant medicines usually are prescribed. Antidepressant medicines are thought to correct chemical imbalances in the brain that are commonly associated with major depressive disorder. Other types of medicine may be added if the symptoms do not respond to antidepressant  medicines alone  or if psychotic delusions or hallucinations occur.  Talk therapy. Talk therapy can be helpful in treating major depressive disorder by providing support, education, and guidance. Certain types of talk therapy also can help with negative thinking (cognitive behavioral therapy) and with relationship issues that trigger this disorder (interpersonal therapy). A mental health specialist can help determine which treatment is best for you. Most people with major depressive disorder do well with a combination of medicine and talk therapy. Treatments involving electrical stimulation of the brain can be used in situations with extremely severe symptoms or when medicine and talk therapy do not work over time. These treatments include electroconvulsive therapy, transcranial magnetic stimulation, and vagal nerve stimulation.   This information is not intended to replace advice given to you by your health care provider. Make sure you discuss any questions you have with your health care provider.   Document Released: 07/10/2012 Document Revised: 04/05/2014 Document Reviewed: 07/10/2012 Elsevier Interactive Patient Education [employer].

## 2015-09-17 LAB — BASIC METABOLIC PANEL WITH GFR
BUN: 17 mg/dL (ref 7–25)
CALCIUM: 9.7 mg/dL (ref 8.6–10.3)
CO2: 22 mmol/L (ref 20–31)
CREATININE: 1.01 mg/dL (ref 0.70–1.33)
Chloride: 107 mmol/L (ref 98–110)
GFR, Est Non African American: 82 mL/min (ref >=60)
GLUCOSE: 84 mg/dL (ref 65–99)
Potassium: 4.7 mmol/L (ref 3.5–5.3)
Sodium: 139 mmol/L (ref 135–146)

## 2015-09-17 LAB — TSH: TSH: 0.96 m[IU]/L (ref 0.40–4.50)

## 2015-09-17 LAB — MAGNESIUM: MAGNESIUM: 1.9 mg/dL (ref 1.5–2.5)

## 2015-09-17 LAB — HEMOGLOBIN A1C
Hgb A1c MFr Bld: 5.5 % (ref <5.7)
Mean Plasma Glucose: 111 mg/dL

## 2015-09-17 LAB — HEPATIC FUNCTION PANEL
ALBUMIN: 4.5 g/dL (ref 3.6–5.1)
ALT: 18 U/L (ref 9–46)
AST: 18 U/L (ref 10–35)
Alkaline Phosphatase: 68 U/L (ref 40–115)
Bilirubin, Direct: 0.1 mg/dL (ref <=0.2)
Indirect Bilirubin: 0.4 mg/dL (ref 0.2–1.2)
TOTAL PROTEIN: 7 g/dL (ref 6.1–8.1)
Total Bilirubin: 0.5 mg/dL (ref 0.2–1.2)

## 2015-09-17 LAB — LIPID PANEL
CHOLESTEROL: 167 mg/dL (ref 125–200)
HDL: 32 mg/dL — ABNORMAL LOW (ref >=40)
LDL CALC: 102 mg/dL (ref <130)
TRIGLYCERIDES: 166 mg/dL — AB (ref <150)
Total CHOL/HDL Ratio: 5.2 Ratio — ABNORMAL HIGH (ref <=5.0)
VLDL: 33 mg/dL — ABNORMAL HIGH (ref <30)

## 2015-09-17 LAB — VITAMIN D 25 HYDROXY (VIT D DEFICIENCY, FRACTURES): VIT D 25 HYDROXY: 24 ng/mL — AB (ref 30–100)

## 2015-10-22 ENCOUNTER — Other Ambulatory Visit: Payer: Self-pay | Admitting: Nurse Practitioner

## 2015-10-22 ENCOUNTER — Other Ambulatory Visit: Payer: Self-pay | Admitting: Internal Medicine

## 2015-10-22 DIAGNOSIS — B182 Chronic viral hepatitis C: Secondary | ICD-10-CM

## 2015-10-23 ENCOUNTER — Ambulatory Visit (INDEPENDENT_AMBULATORY_CARE_PROVIDER_SITE_OTHER): Payer: Managed Care, Other (non HMO) | Admitting: Physician Assistant

## 2015-10-23 ENCOUNTER — Encounter: Payer: Self-pay | Admitting: Physician Assistant

## 2015-10-23 DIAGNOSIS — F329 Major depressive disorder, single episode, unspecified: Secondary | ICD-10-CM | POA: Diagnosis not present

## 2015-10-23 DIAGNOSIS — F32A Depression, unspecified: Secondary | ICD-10-CM

## 2015-10-23 NOTE — Progress Notes (Signed)
Assessment and Plan: Depression/anxiety- no need to continue medication if feeling better, follow up as needed Smoking cessation- discussed, not ready to quit at this.   Future Appointments Date Time Provider Department Center  02/09/2016 9:00 AM Quentin Mulling, PA-C [facility] None    HPI 60 y.o.male presents for 1 month follow up for depression. Last visit he was started on wellbutrin for depression symptoms, he states it was helping him but he stopped a 1 week ago, he had a conversation with his aunt and feels that it helped ground him. He is also back on his vitamins.    Past Medical History:  Diagnosis Date  . Chest pain 05/16/2012  . Colon polyps   . DDD (degenerative disc disease)   . Diabetes mellitus without complication (HCC)   . Hepatitis C   . Hiatal hernia   . Hypercholesteremia   . Marijuana use   . Osteoarthritis   . Sarcoid (HCC)   . Thrombocytopenia (HCC)   . Tobacco abuse      Allergies  Allergen Reactions  . Chantix [Varenicline]     Nausea  . Lopid [Gemfibrozil]     Nausea/weakness      Current Outpatient Prescriptions on File Prior to Visit  Medication Sig Dispense Refill  . buPROPion (WELLBUTRIN XL) 150 MG 24 hr tablet Take 1 tablet (150 mg total) by mouth every morning. 30 tablet 2  . esomeprazole (NEXIUM) 20 MG capsule Take 1 capsule (20 mg total) by mouth daily. 30 capsule 0  . fenofibrate micronized (LOFIBRA) 134 MG capsule TAKE ONE CAPSULE BY MOUTH BEFORE BREAKFAST 90 capsule 0  . glucose blood (FREESTYLE LITE) test strip Use as instructed Dx code: E11.9 100 each 5  . Lancets (FREESTYLE) lancets Use as instructed Dx code:E11.9 100 each 5  . metFORMIN (GLUCOPHAGE-XR) 500 MG 24 hr tablet TAKE 1 TO 2 TABLETS TWICE A DAY FOR DIABETES 360 tablet 1  . triamcinolone cream (KENALOG) 0.1 % Apply 1 application topically 3 (three) times daily. 45 g 0   No current facility-administered medications on file prior to visit.     ROS: all negative except  above.   Physical Exam: Filed Weights   10/23/15 0935  Weight: 225 lb 12.8 oz (102.4 kg)   BP 130/82   Pulse 73   Temp 97.5 F (36.4 C) (Temporal)   Resp 16   Ht 5' 11.5" (1.816 m)   Wt 225 lb 12.8 oz (102.4 kg)   SpO2 97%   BMI 31.05 kg/m  General Appearance: Well nourished, in no apparent distress. Eyes: PERRLA, EOMs, conjunctiva no swelling or erythema Sinuses: No Frontal/maxillary tenderness ENT/Mouth: Ext aud canals clear, TMs without erythema, bulging. No erythema, swelling, or exudate on post pharynx.  Tonsils not swollen or erythematous. Hearing normal.  Neck: Supple, thyroid normal.  Respiratory: Respiratory effort normal, BS equal bilaterally without rales, rhonchi, wheezing or stridor.  Cardio: RRR with no MRGs. Brisk peripheral pulses without edema.  Abdomen: Soft, + BS.  Non tender, no guarding, rebound, hernias, masses. Lymphatics: Non tender without lymphadenopathy.  Musculoskeletal: Full ROM, 5/5 strength, normal gait.  Skin: Warm, dry without rashes, lesions, ecchymosis.  Neuro: Cranial nerves intact. Normal muscle tone, no cerebellar symptoms. Sensation intact.  Psych: Awake and oriented X 3, normal affect, Insight and Judgment appropriate.     Quentin Mulling, PA-C 9:43 AM [facility]

## 2015-11-03 ENCOUNTER — Other Ambulatory Visit: Payer: Self-pay | Admitting: *Deleted

## 2015-11-03 ENCOUNTER — Ambulatory Visit
Admission: RE | Admit: 2015-11-03 | Discharge: 2015-11-03 | Disposition: A | Discharge status: Home / Self Care | Payer: Managed Care, Other (non HMO) | Source: Ambulatory Visit | Attending: Nurse Practitioner | Admitting: Nurse Practitioner

## 2015-11-03 DIAGNOSIS — F330 Major depressive disorder, recurrent, mild: Secondary | ICD-10-CM

## 2015-11-03 DIAGNOSIS — B182 Chronic viral hepatitis C: Secondary | ICD-10-CM

## 2015-11-03 MED ORDER — BUPROPION HCL ER (XL) 150 MG PO TB24: 150.0000 mg | ORAL_TABLET | ORAL | 2 refills | Status: DC

## 2015-11-04 ENCOUNTER — Other Ambulatory Visit: Payer: Self-pay | Admitting: Physician Assistant

## 2015-11-04 DIAGNOSIS — F330 Major depressive disorder, recurrent, mild: Secondary | ICD-10-CM

## 2015-11-04 MED ORDER — BUPROPION HCL ER (XL) 150 MG PO TB24: 150.0000 mg | ORAL_TABLET | ORAL | 0 refills | Status: DC

## 2015-12-12 ENCOUNTER — Other Ambulatory Visit: Payer: Self-pay | Admitting: Internal Medicine

## 2015-12-18 ENCOUNTER — Other Ambulatory Visit: Payer: Self-pay | Admitting: Physician Assistant

## 2016-01-20 ENCOUNTER — Telehealth: Payer: Self-pay

## 2016-01-20 NOTE — Telephone Encounter (Signed)
-----   Message from Amanda Collier, [place] sent at 01/20/2016 Quentin Mulling10:21 AM EDT ----- Regarding: RE: SUGARS Can just keep CPE appt. I agree Marchelle FolksAmanda ----- Message ----- From: Gregery NaAngela D Nickoles Gregori, CMA Sent: 01/20/2016   9:46 AM To: Quentin MullingAmanda Collier, PA-C Subject: SUGARS                                         Pt does have an appt but he states he really does not feel that he should come in for his sugars. He states he has been not eating well & has been drinking a lot of sugary drinks. Blood sugar today was 101. He states he will do better but just wanted you to be informed of his sugar levels. Wants to know can he not keep that appt. Advise

## 2016-01-20 NOTE — Telephone Encounter (Signed)
Informed pt that it was ok to cancel his appt on 26th oct 2017

## 2016-01-22 ENCOUNTER — Ambulatory Visit: Payer: Self-pay | Admitting: Physician Assistant

## 2016-02-09 ENCOUNTER — Encounter: Payer: Self-pay | Admitting: Physician Assistant

## 2016-02-09 ENCOUNTER — Ambulatory Visit (INDEPENDENT_AMBULATORY_CARE_PROVIDER_SITE_OTHER): Payer: Managed Care, Other (non HMO) | Admitting: Physician Assistant

## 2016-02-09 VITALS — BP 112/66 | HR 73 | Temp 97.7°F | Resp 16 | Ht 72.5 in | Wt 234.0 lb

## 2016-02-09 DIAGNOSIS — D696 Thrombocytopenia, unspecified: Secondary | ICD-10-CM

## 2016-02-09 DIAGNOSIS — F3341 Major depressive disorder, recurrent, in partial remission: Secondary | ICD-10-CM

## 2016-02-09 DIAGNOSIS — Z79899 Other long term (current) drug therapy: Secondary | ICD-10-CM

## 2016-02-09 DIAGNOSIS — E519 Thiamine deficiency, unspecified: Secondary | ICD-10-CM | POA: Diagnosis not present

## 2016-02-09 DIAGNOSIS — I10 Essential (primary) hypertension: Secondary | ICD-10-CM | POA: Diagnosis not present

## 2016-02-09 DIAGNOSIS — B1920 Unspecified viral hepatitis C without hepatic coma: Secondary | ICD-10-CM

## 2016-02-09 DIAGNOSIS — R7303 Prediabetes: Secondary | ICD-10-CM

## 2016-02-09 DIAGNOSIS — Z720 Tobacco use: Secondary | ICD-10-CM

## 2016-02-09 DIAGNOSIS — D869 Sarcoidosis, unspecified: Secondary | ICD-10-CM

## 2016-02-09 DIAGNOSIS — R6889 Other general symptoms and signs: Secondary | ICD-10-CM

## 2016-02-09 DIAGNOSIS — Z23 Encounter for immunization: Secondary | ICD-10-CM | POA: Diagnosis not present

## 2016-02-09 DIAGNOSIS — Z136 Encounter for screening for cardiovascular disorders: Secondary | ICD-10-CM | POA: Diagnosis not present

## 2016-02-09 DIAGNOSIS — F1290 Cannabis use, unspecified, uncomplicated: Secondary | ICD-10-CM

## 2016-02-09 DIAGNOSIS — Z0001 Encounter for general adult medical examination with abnormal findings: Secondary | ICD-10-CM

## 2016-02-09 DIAGNOSIS — D649 Anemia, unspecified: Secondary | ICD-10-CM | POA: Diagnosis not present

## 2016-02-09 DIAGNOSIS — E663 Overweight: Secondary | ICD-10-CM

## 2016-02-09 DIAGNOSIS — E785 Hyperlipidemia, unspecified: Secondary | ICD-10-CM | POA: Diagnosis not present

## 2016-02-09 DIAGNOSIS — Z125 Encounter for screening for malignant neoplasm of prostate: Secondary | ICD-10-CM

## 2016-02-09 DIAGNOSIS — E559 Vitamin D deficiency, unspecified: Secondary | ICD-10-CM

## 2016-02-09 DIAGNOSIS — Z1159 Encounter for screening for other viral diseases: Secondary | ICD-10-CM

## 2016-02-09 DIAGNOSIS — K449 Diaphragmatic hernia without obstruction or gangrene: Secondary | ICD-10-CM | POA: Diagnosis not present

## 2016-02-09 LAB — CBC WITH DIFFERENTIAL/PLATELET
BASOS PCT: 1 %
Basophils Absolute: 69 cells/uL (ref 0–200)
EOS PCT: 5 %
Eosinophils Absolute: 345 cells/uL (ref 15–500)
HEMATOCRIT: 44.8 % (ref 38.5–50.0)
HEMOGLOBIN: 14.4 g/dL (ref 13.2–17.1)
LYMPHS ABS: 1932 {cells}/uL (ref 850–3900)
Lymphocytes Relative: 28 %
MCH: 28.6 pg (ref 27.0–33.0)
MCHC: 32.1 g/dL (ref 32.0–36.0)
MCV: 88.9 fL (ref 80.0–100.0)
MONOS PCT: 8 %
MPV: 9.8 fL (ref 7.5–12.5)
Monocytes Absolute: 552 cells/uL (ref 200–950)
NEUTROS ABS: 4002 {cells}/uL (ref 1500–7800)
Neutrophils Relative %: 58 %
Platelets: 260 10*3/uL (ref 140–400)
RBC: 5.04 MIL/uL (ref 4.20–5.80)
RDW: 13.6 % (ref 11.0–15.0)
WBC: 6.9 10*3/uL (ref 3.8–10.8)

## 2016-02-09 MED ORDER — FENOFIBRATE MICRONIZED 134 MG PO CAPS: ORAL_CAPSULE | ORAL | 0 refills | Status: DC

## 2016-02-09 MED ORDER — AZITHROMYCIN 250 MG PO TABS: ORAL_TABLET | ORAL | 1 refills | Status: AC

## 2016-02-09 MED ORDER — METFORMIN HCL ER 500 MG PO TB24: ORAL_TABLET | ORAL | 1 refills | Status: DC

## 2016-02-09 NOTE — Progress Notes (Signed)
Complete Physical  Assessment and Plan: Essential hypertension - continue medications, DASH diet, exercise and monitor at home. Call if greater than 130/80.  - BASIC METABOLIC PANEL WITH GFR - Hepatic function panel - TSH - Urinalysis, Routine w reflex microscopic ([facility]) - Microalbumin / creatinine urine ratio - EKG 12-Lead - US, RETROPERITNL ABD,  LTD  Thrombocytopenia (HCC) - CBC with Differential/Platelet  Sarcoidosis (HCC) CXR, stop smoking - - zpak and symbicort given to patient - follow up Dr. Shelle Ironlance  Tobacco abuse Smoking cessation-  instruction/counseling given, counseled patient on the dangers of tobacco use, advised patient to stop smoking, and reviewed strategies to maximize success, patient not ready to quit at this time.  - EKG 12-Lead - DG Chest 2 View; Future - zpak and symbicort given to patient - follow up Dr. Shelle Ironlance   Hepatitis C virus infection without hepatic coma, unspecified chronicity treated   Hyperlipidemia -continue medications, check lipids, decrease fatty foods, increase activity.  - Lipid panel  Prediabetes Discussed general issues about diabetes pathophysiology and management., Educational material distributed., Suggested low cholesterol diet., Encouraged aerobic exercise., Discussed foot care., Reminded to get yearly retinal exam. - emoglobin A1c - Insulin, fasting   Medication management - Magnesium   Overweight Obesity with co morbidities- long discussion about weight loss, diet, and exercise  Marijuana use Advised to quit smoking  DDD (degenerative disc disease), lumbar RICE, NSAIDS, exercises given, if not better get xray and PT referral or ortho referral.    Hiatal hernia  Vitamin D deficiency - Vit D  25 hydroxy (rtn osteoporosis monitoring)  Thiamin deficiency - Vitamin B12  Anemia, unspecified anemia type - Iron and TIBC - Ferritin  Screening PSA (prostate specific antigen) - PSA  Discussed med's effects  and SE's. Screening labs and tests as requested with regular follow-up as recommended.  HPI Patient presents for a complete physical.  Has had cough with left shoulder blade, no fever, chills, no mucus, no SOB, mild wheezing x 2 weeks, he is still smoking. He smokes and has sarcoidosis, follows with Dr. Shelle Ironlance and has seen Dr. [place] in the past.   His blood pressure has been controlled at home, today their BP is BP: 112/66 He does not workout. He denies chest pain, shortness of breath, dizziness.  He is on cholesterol medication, fenofibrate and denies myalgias. His cholesterol is not at goal. The cholesterol last visit was:   Lab Results  Component Value Date   CHOL 167 09/16/2015   HDL 32 (L) 09/16/2015   LDLCALC 102 09/16/2015   TRIG 166 (H) 09/16/2015   CHOLHDL 5.2 (H) 09/16/2015    He has been working on diet and exercise for prediabetes, and denies paresthesia of the feet, polydipsia and polyuria. Last A1C in the office was:  Lab Results  Component Value Date   HGBA1C 5.5 09/16/2015   Patient is on Vitamin D supplement.   Lab Results  Component Value Date   VD25OH 24 (L) 09/16/2015     Last PSA is  Lab Results  Component Value Date   PSA 1.20 02/07/2015   PSA 1.14 02/01/2014   He did have low thimine and has been on B complex.  He has a history of hepatitis C, followed with Dr. Lucas MallowZamar, treated on 04/22/2014, Harvoni, and he has decreased his beer drinking, has been drinking Odoul's. Has had Hep B vaccine, no longer needs to follow up, will follow up here.   BMI is Body mass index is 31.3  kg/m., he is working on diet and exercise. Wt Readings from Last 3 Encounters:  02/09/16 234 lb (106.1 kg)  10/23/15 225 lb 12.8 oz (102.4 kg)  09/16/15 225 lb 6.4 oz (102.2 kg)     Current Medications:  Current Outpatient Prescriptions on File Prior to Visit  Medication Sig Dispense Refill  . buPROPion (WELLBUTRIN XL) 150 MG 24 hr tablet Take 1 tablet (150 mg total) by mouth  every morning. 90 tablet 0  . fenofibrate micronized (LOFIBRA) 134 MG capsule TAKE 1 CAPSULE BY MOUTH BEFORE BREAKFAST 90 capsule 0  . glucose blood (FREESTYLE LITE) test strip Use as instructed Dx code: E11.9 100 each 5  . Lancets (FREESTYLE) lancets Use as instructed Dx code:E11.9 100 each 5  . metFORMIN (GLUCOPHAGE-XR) 500 MG 24 hr tablet TAKE 1 TO 2 TABLETS TWICE A DAY FOR DIABETES 360 tablet 1  . triamcinolone cream (KENALOG) 0.1 % Apply 1 application topically 3 (three) times daily. 45 g 0  . esomeprazole (NEXIUM) 20 MG capsule Take 1 capsule (20 mg total) by mouth daily. 30 capsule 0   No current facility-administered medications on file prior to visit.    Health Maintenance:  Immunization History  Administered Date(s) Administered  . Hepatitis B 06/24/2014  . Influenza Whole 01/17/2012  . Influenza, Seasonal, Injecte, Preservative Fre 02/09/2016  . Pneumococcal Conjugate-13 12/28/2011  . Pneumococcal Polysaccharide-23 10/03/2012  . Tdap 10/04/2011    Tetanus: 09/2011 Pneumovax: 2014 Prevnar 13: 2013 Flu vaccine: TODAY Zostavax: N/A  Hep B: 2016 DEXA: Colonoscopy: 12/2013 Dr. Christella Hartigan EGD: Heart cath 04/2013  Allergies:  Allergies  Allergen Reactions  . Chantix [Varenicline]     Nausea  . Lopid [Gemfibrozil]     Nausea/weakness   Medical History:  Past Medical History:  Diagnosis Date  . Chest pain 05/16/2012  . Colon polyps   . DDD (degenerative disc disease)   . Diabetes mellitus without complication (HCC)   . Hepatitis C   . Hiatal hernia   . Hypercholesteremia   . Marijuana use   . Osteoarthritis   . Sarcoid (HCC)   . Thrombocytopenia (HCC)   . Tobacco abuse    SURGICAL HISTORY He  has a past surgical history that includes Appendectomy; HEAD TRAUMA (1975); and left heart catheterization with coronary angiogram (N/A, 05/17/2012). FAMILY HISTORY His family history includes Cancer in his father; Diabetes Mellitus II in his mother; Leukemia in his  mother. SOCIAL HISTORY He  reports that he has been smoking Cigarettes.  He has a 15.00 pack-year smoking history. He has never used smokeless tobacco. He reports that he drinks about 3.6 oz of alcohol per week . He reports that he uses drugs, including Marijuana, about 2 times per week.  Review of Systems  Constitutional: Negative.  Negative for chills, diaphoresis, fever, malaise/fatigue and weight loss.  HENT: Negative for congestion, ear discharge, ear pain, hearing loss, nosebleeds, sore throat and tinnitus.   Eyes: Negative.   Respiratory: Positive for cough. Negative for shortness of breath and stridor.   Cardiovascular: Negative.  Negative for leg swelling.  Gastrointestinal: Negative.   Genitourinary: Negative.   Musculoskeletal: Positive for back pain and joint pain. Negative for falls, myalgias and neck pain.  Skin: Negative.   Neurological: Negative.  Negative for weakness and headaches.  Psychiatric/Behavioral: Negative.      Physical Exam: Estimated body mass index is 31.3 kg/m as calculated from the following:   Height as of this encounter: 6' 0.5" (1.842 m).   Weight as  of this encounter: 234 lb (106.1 kg). BP 112/66   Pulse 73   Temp 97.7 F (36.5 C)   Resp 16   Ht 6' 0.5" (1.842 m)   Wt 234 lb (106.1 kg)   SpO2 97%   BMI 31.30 kg/m  General Appearance: Obese, in no apparent distress.  Eyes: PERRLA, EOMs, conjunctiva no swelling or erythema, normal fundi and vessels.  Sinuses: No Frontal/maxillary tenderness  ENT/Mouth: Ext aud canals clear,  Crowded mouth, normal light reflex with TMs without erythema, bulging. Good dentition. No erythema, swelling, or exudate on post pharynx. Tonsils not swollen or erythematous. Hearing normal.  Neck: Supple, thyroid normal. No bruits  Respiratory: Respiratory effort normal, BS equal bilaterally without rales, rhonchi, wheezing or stridor.  Cardio: RRR without murmurs, rubs or gallops. Brisk peripheral pulses without edema.   Chest: symmetric, with normal excursions and percussion.  Abdomen: Soft, nontender, obese no guarding, rebound, hernias, masses, or organomegaly. .  Lymphatics: Non tender without lymphadenopathy.  Genitourinary: defer Musculoskeletal: Full ROM all peripheral extremities,5/5 strength, and normal gait.  Skin: Warm, dry without rashes, lesions, ecchymosis. Neuro: Cranial nerves intact, reflexes equal bilaterally. Normal muscle tone, no cerebellar symptoms. Psych: Awake and oriented X 3, normal affect, Insight and Judgment appropriate.   EKG: WNL no changes. Aorta scan normal  Quentin MullingAmanda Shaynah Hund 9:26 AM [facility]

## 2016-02-09 NOTE — Patient Instructions (Addendum)
   Your A1C is a measure of your sugar over the past 3 months and is not affected by what you have eaten over the past few days. Diabetes increases your chances of stroke and heart attack over 300 % and is the leading cause of blindness and kidney failure in the [place]. Please make sure you decrease bad carbs like white bread, white rice, potatoes, corn, soft drinks, pasta, cereals, refined sugars, sweet tea, dried fruits, and fruit juice. Good carbs are okay to eat in moderation like sweet potatoes, brown rice, whole grain pasta/bread, most fruit (except dried fruit) and you can eat as many veggies as you want.   Greater than 6.5 is considered diabetic. Between 6.4 and 5.7 is prediabetic If your A1C is less than 5.7 you are NOT diabetic.  Targets for Glucose Readings: Time of Check Target for patients WITHOUT Diabetes Target for DIABETICS  Before Meals fasting Less than 100  less than 150  Two hours after meals Less than 200  Less than 250   Stay on 5000 IU vitamin D Stay on B comlplex  Use the symbicort twice daily for cough, wash mouth out afterwards.

## 2016-02-10 LAB — FERRITIN: FERRITIN: 158 ng/mL (ref 20–380)

## 2016-02-10 LAB — LIPID PANEL
Cholesterol: 143 mg/dL (ref <200)
HDL: 23 mg/dL — ABNORMAL LOW (ref >40)
LDL CALC: 79 mg/dL (ref <100)
Total CHOL/HDL Ratio: 6.2 Ratio — ABNORMAL HIGH (ref <5.0)
Triglycerides: 205 mg/dL — ABNORMAL HIGH (ref <150)
VLDL: 41 mg/dL — ABNORMAL HIGH (ref <30)

## 2016-02-10 LAB — HEPATIC FUNCTION PANEL
ALBUMIN: 4 g/dL (ref 3.6–5.1)
ALK PHOS: 63 U/L (ref 40–115)
ALT: 19 U/L (ref 9–46)
AST: 17 U/L (ref 10–35)
Bilirubin, Direct: 0.1 mg/dL (ref <=0.2)
Indirect Bilirubin: 0.2 mg/dL (ref 0.2–1.2)
TOTAL PROTEIN: 6.3 g/dL (ref 6.1–8.1)
Total Bilirubin: 0.3 mg/dL (ref 0.2–1.2)

## 2016-02-10 LAB — PSA: PSA: 1.1 ng/mL (ref <=4.0)

## 2016-02-10 LAB — HEMOGLOBIN A1C
HEMOGLOBIN A1C: 5.1 % (ref <5.7)
Mean Plasma Glucose: 100 mg/dL

## 2016-02-10 LAB — MICROALBUMIN / CREATININE URINE RATIO
Creatinine, Urine: 184 mg/dL (ref 20–370)
MICROALB/CREAT RATIO: 3 ug/mg{creat} (ref <30)
Microalb, Ur: 0.6 mg/dL

## 2016-02-10 LAB — URINALYSIS, ROUTINE W REFLEX MICROSCOPIC
BILIRUBIN URINE: NEGATIVE
GLUCOSE, UA: NEGATIVE
HGB URINE DIPSTICK: NEGATIVE
KETONES UR: NEGATIVE
Leukocytes, UA: NEGATIVE
Nitrite: NEGATIVE
PH: 5.5 (ref 5.0–8.0)
Protein, ur: NEGATIVE
Specific Gravity, Urine: 1.022 (ref 1.001–1.035)

## 2016-02-10 LAB — VITAMIN D 25 HYDROXY (VIT D DEFICIENCY, FRACTURES): Vit D, 25-Hydroxy: 21 ng/mL — ABNORMAL LOW (ref 30–100)

## 2016-02-10 LAB — VITAMIN B12: VITAMIN B 12: 472 pg/mL (ref 200–1100)

## 2016-02-10 LAB — MAGNESIUM: MAGNESIUM: 2.3 mg/dL (ref 1.5–2.5)

## 2016-02-10 LAB — IRON AND TIBC
%SAT: 25 % (ref 15–60)
Iron: 98 ug/dL (ref 50–180)
TIBC: 397 ug/dL (ref 250–425)
UIBC: 299 ug/dL (ref 125–400)

## 2016-02-10 LAB — INSULIN, FASTING: INSULIN FASTING, SERUM: 18.5 u[IU]/mL (ref 2.0–19.6)

## 2016-02-10 LAB — HIV ANTIBODY (ROUTINE TESTING W REFLEX): HIV 1&2 Ab, 4th Generation: NONREACTIVE

## 2016-02-10 LAB — BASIC METABOLIC PANEL WITH GFR
BUN: 15 mg/dL (ref 7–25)
CALCIUM: 9.1 mg/dL (ref 8.6–10.3)
CO2: 19 mmol/L — AB (ref 20–31)
Chloride: 111 mmol/L — ABNORMAL HIGH (ref 98–110)
Creat: 0.97 mg/dL (ref 0.70–1.33)
GFR, Est Non African American: 86 mL/min (ref >=60)
GLUCOSE: 103 mg/dL — AB (ref 65–99)
Potassium: 4.3 mmol/L (ref 3.5–5.3)
Sodium: 142 mmol/L (ref 135–146)

## 2016-02-10 LAB — TSH: TSH: 0.78 m[IU]/L (ref 0.40–4.50)

## 2016-05-17 ENCOUNTER — Encounter: Payer: No Typology Code available for payment source | Admitting: Physician Assistant

## 2016-05-17 NOTE — Progress Notes (Signed)
  Review of Systems  Unable to perform ROS: Other  Constitutional: Negative.  Negative for chills, diaphoresis, fever, malaise/fatigue and weight loss.  HENT: Negative for congestion, ear discharge, ear pain, hearing loss, nosebleeds, sore throat and tinnitus.   Eyes: Negative.   Respiratory: Negative.  Negative for shortness of breath and stridor.   Cardiovascular: Negative.   Gastrointestinal: Negative.   Genitourinary: Negative.   Musculoskeletal: Negative for back pain, falls, joint pain, myalgias and neck pain.  Skin: Negative.   Neurological: Negative.  Negative for weakness and headaches.  Psychiatric/Behavioral: Negative for depression, hallucinations, memory loss, substance abuse and suicidal ideas. The patient does not have insomnia.     This encounter was created in error - please disregard. PATIENT LEFT DUE TO INSURANCE REASONS

## 2016-06-07 ENCOUNTER — Ambulatory Visit (INDEPENDENT_AMBULATORY_CARE_PROVIDER_SITE_OTHER): Payer: Managed Care, Other (non HMO) | Admitting: Physician Assistant

## 2016-06-07 ENCOUNTER — Encounter: Payer: Self-pay | Admitting: Physician Assistant

## 2016-06-07 VITALS — BP 120/80 | HR 92 | Temp 97.2°F | Resp 14 | Ht 71.5 in | Wt 237.6 lb

## 2016-06-07 DIAGNOSIS — D696 Thrombocytopenia, unspecified: Secondary | ICD-10-CM | POA: Diagnosis not present

## 2016-06-07 DIAGNOSIS — Z79899 Other long term (current) drug therapy: Secondary | ICD-10-CM

## 2016-06-07 DIAGNOSIS — E785 Hyperlipidemia, unspecified: Secondary | ICD-10-CM

## 2016-06-07 DIAGNOSIS — D869 Sarcoidosis, unspecified: Secondary | ICD-10-CM | POA: Diagnosis not present

## 2016-06-07 DIAGNOSIS — Z720 Tobacco use: Secondary | ICD-10-CM

## 2016-06-07 DIAGNOSIS — I10 Essential (primary) hypertension: Secondary | ICD-10-CM | POA: Diagnosis not present

## 2016-06-07 LAB — CBC WITH DIFFERENTIAL/PLATELET
Basophils Absolute: 85 cells/uL (ref 0–200)
Basophils Relative: 1 %
EOS PCT: 4 %
Eosinophils Absolute: 340 cells/uL (ref 15–500)
HCT: 45.2 % (ref 38.5–50.0)
Hemoglobin: 15 g/dL (ref 13.2–17.1)
LYMPHS PCT: 26 %
Lymphs Abs: 2210 cells/uL (ref 850–3900)
MCH: 29 pg (ref 27.0–33.0)
MCHC: 33.2 g/dL (ref 32.0–36.0)
MCV: 87.3 fL (ref 80.0–100.0)
MONOS PCT: 7 %
MPV: 9.8 fL (ref 7.5–12.5)
Monocytes Absolute: 595 cells/uL (ref 200–950)
NEUTROS PCT: 62 %
Neutro Abs: 5270 cells/uL (ref 1500–7800)
PLATELETS: 288 10*3/uL (ref 140–400)
RBC: 5.18 MIL/uL (ref 4.20–5.80)
RDW: 13.6 % (ref 11.0–15.0)
WBC: 8.5 10*3/uL (ref 3.8–10.8)

## 2016-06-07 LAB — HEPATIC FUNCTION PANEL
ALT: 22 U/L (ref 9–46)
AST: 22 U/L (ref 10–35)
Albumin: 4.3 g/dL (ref 3.6–5.1)
Alkaline Phosphatase: 69 U/L (ref 40–115)
BILIRUBIN DIRECT: 0.1 mg/dL (ref <=0.2)
Indirect Bilirubin: 0.4 mg/dL (ref 0.2–1.2)
Total Bilirubin: 0.5 mg/dL (ref 0.2–1.2)
Total Protein: 6.9 g/dL (ref 6.1–8.1)

## 2016-06-07 LAB — BASIC METABOLIC PANEL WITH GFR
BUN: 17 mg/dL (ref 7–25)
CALCIUM: 9.5 mg/dL (ref 8.6–10.3)
CO2: 23 mmol/L (ref 20–31)
CREATININE: 1.09 mg/dL (ref 0.70–1.33)
Chloride: 108 mmol/L (ref 98–110)
GFR, Est African American: 86 mL/min (ref >=60)
GFR, Est Non African American: 74 mL/min (ref >=60)
Glucose, Bld: 102 mg/dL — ABNORMAL HIGH (ref 65–99)
Potassium: 4 mmol/L (ref 3.5–5.3)
SODIUM: 141 mmol/L (ref 135–146)

## 2016-06-07 LAB — LIPID PANEL
CHOL/HDL RATIO: 8.4 ratio — AB (ref <5.0)
CHOLESTEROL: 159 mg/dL (ref <200)
HDL: 19 mg/dL — AB (ref >40)
LDL Cholesterol: 70 mg/dL (ref <100)
Triglycerides: 351 mg/dL — ABNORMAL HIGH (ref <150)
VLDL: 70 mg/dL — ABNORMAL HIGH (ref <30)

## 2016-06-07 LAB — TSH: TSH: 0.86 m[IU]/L (ref 0.40–4.50)

## 2016-06-07 NOTE — Patient Instructions (Signed)
If you have a smart phone, please look up Smoke Free app, this will help you stay on track and give you information about money you have saved, life that you have gained back and a ton of more information.   We are giving you chantix for smoking cessation. You can do it! And we are here to help! You may have heard some scary side effects about chantix, the three most common I hear about are nausea, crazy dreams and depression.  However, I like for my patients to try to stay on 1/2 a tablet twice a day rather than one tablet twice a day as normally prescribed. This helps decrease the chances of side effects and helps save money by making a one month prescription last two months  Please start the prescription this way:  Start 1/2 tablet by mouth once daily after food with a full glass of water for 3 days Then do 1/2 tablet by mouth twice daily for 4 days. During this first week you can smoke, but try to stop after this week.  At this point we have several options: 1) continue on 1/2 tablet twice a day- which I encourage you to do. You can stay on this dose the rest of the time on the medication or if you still feel the need to smoke you can do one of the two options below. 2) do one tablet in the morning and 1/2 in the evening which helps decrease dreams. 3) do one tablet twice a day.   What if I miss a dose? If you miss a dose, take it as soon as you can. If it is almost time for your next dose, take only that dose. Do not take double or extra doses.  What should I watch for while using this medicine? Visit your doctor or health care professional for regular check ups. Ask for ongoing advice and encouragement from your doctor or healthcare professional, friends, and family to help you quit. If you smoke while on this medication, quit again  Your mouth may get dry. Chewing sugarless gum or hard candy, and drinking plenty of water may help. Contact your doctor if the problem does not go away or is  severe.  You may get drowsy or dizzy. Do not drive, use machinery, or do anything that needs mental alertness until you know how this medicine affects you. Do not stand or sit up quickly, especially if you are an older patient.   The use of this medicine may increase the chance of suicidal thoughts or actions. Pay special attention to how you are responding while on this medicine. Any worsening of mood, or thoughts of suicide or dying should be reported to your health care professional right away.  ADVANTAGES OF QUITTING SMOKING  Within 20 minutes, blood pressure decreases. Your pulse is at normal level.  After 8 hours, carbon monoxide levels in the blood return to normal. Your oxygen level increases.  After 24 hours, the chance of having a heart attack starts to decrease. Your breath, hair, and body stop smelling like smoke.  After 48 hours, damaged nerve endings begin to recover. Your sense of taste and smell improve.  After 72 hours, the body is virtually free of nicotine. Your bronchial tubes relax and breathing becomes easier.  After 2 to 12 weeks, lungs can hold more air. Exercise becomes easier and circulation improves.  After 1 year, the risk of coronary heart disease is cut in half.  After 5 years,   the risk of stroke falls to the same as a nonsmoker.  After 10 years, the risk of lung cancer is cut in half and the risk of other cancers decreases significantly.  After 15 years, the risk of coronary heart disease drops, usually to the level of a nonsmoker.  You will have extra money to spend on things other than cigarettes.  

## 2016-06-07 NOTE — Progress Notes (Signed)
3 month and depression  Assessment and Plan: Essential hypertension - continue medications, DASH diet, exercise and monitor at home. Call if greater than 130/80.  - BASIC METABOLIC PANEL WITH GFR - Hepatic function panel - TSH  Thrombocytopenia (HCC) Check CBC - CBC with Differential/Platelet   Sarcoidosis (HCC) Continue follow up pulmonary Stop smoking Need CXR, delcines today   Overweight Obesity with co morbidities- long discussion about weight loss, diet, and exercise   Hyperlipidemia - check lipids, decrease fatty foods, increase activity.  - Lipid panel  Tobacco abuse Advised/counseled to quit smoking   Discussed med's effects and SE's. Screening labs and tests as requested with regular follow-up as recommended. Future Appointments Date Time Provider Department Center  02/08/2017 9:00 AM Quentin Mulling, PA-C [id number] None   HPI Patient presents for 3 month follow up for HTN, chol, sarcoidosis, and acute visit for depression.   He states he has been under stress,  getting worse. He has 76 year old son that is not working/pushing himself, he is taking care of one of his granddaughter, age 71.    His blood pressure has been controlled at home, today their BP is BP: 120/80 He does not workout. He denies chest pain, shortness of breath, dizziness.  He is on cholesterol medication, fenofibrate and denies myalgias. His cholesterol is not at goal. The cholesterol last visit was:   Lab Results  Component Value Date   CHOL 143 02/09/2016   HDL 23 (L) 02/09/2016   LDLCALC 79 02/09/2016   TRIG 205 (H) 02/09/2016   CHOLHDL 6.2 (H) 02/09/2016    He has been working on diet and exercise for prediabetes, and denies paresthesia of the feet, polydipsia and polyuria. Last A1C in the office was:  Lab Results  Component Value Date   HGBA1C 5.1 02/09/2016   Patient is on Vitamin D supplement.   Lab Results  Component Value Date   VD25OH 21 (L) 02/09/2016     He smokes and  has sarcoidosis, follows with Dr. Shelle Iron and has seen Dr. [place] in the past.   He has a history of hepatitis C, follows with Dr. Lucas Mallow, treated on 04/22/2014, Harvoni, and he has decreased his beer drinking, has been drinking Odoul's. Has had Hep B vaccine.  BMI is Body mass index is 32.68 kg/m., he is working on diet and exercise. Wt Readings from Last 3 Encounters:  06/07/16 237 lb 9.6 oz (107.8 kg)  02/09/16 234 lb (106.1 kg)  10/23/15 225 lb 12.8 oz (102.4 kg)     Current Medications:  Current Outpatient Prescriptions on File Prior to Visit  Medication Sig Dispense Refill  . fenofibrate micronized (LOFIBRA) 134 MG capsule TAKE 1 CAPSULE BY MOUTH BEFORE BREAKFAST 90 capsule 0  . glucose blood (FREESTYLE LITE) test strip Use as instructed Dx code: E11.9 100 each 5  . Lancets (FREESTYLE) lancets Use as instructed Dx code:E11.9 100 each 5  . metFORMIN (GLUCOPHAGE-XR) 500 MG 24 hr tablet TAKE 1 TO 2 TABLETS TWICE A DAY FOR DIABETES 360 tablet 1  . triamcinolone cream (KENALOG) 0.1 % Apply 1 application topically 3 (three) times daily. 45 g 0  . esomeprazole (NEXIUM) 20 MG capsule Take 1 capsule (20 mg total) by mouth daily. 30 capsule 0   No current facility-administered medications on file prior to visit.    He has Sarcoidosis (HCC); Hyperlipidemia; Overweight; Hepatitis C; Prediabetes; Tobacco abuse; Hiatal hernia; Thrombocytopenia (HCC); Osteoarthritis; Colon polyps; DDD (degenerative disc disease), lumbar; Essential hypertension; Medication management;  and Depression on his problem list.  Allergies:  Allergies  Allergen Reactions  . Chantix [Varenicline]     Nausea  . Lopid [Gemfibrozil]     Nausea/weakness   Surgical History: reviewed Family History: reviewed Social History:  Reviewed  Review of Systems  Constitutional: Negative.  Negative for chills, diaphoresis, fever, malaise/fatigue and weight loss.  HENT: Negative for congestion, ear discharge, ear pain, hearing  loss, nosebleeds, sore throat and tinnitus.   Eyes: Negative.   Respiratory: Negative.  Negative for shortness of breath and stridor.   Cardiovascular: Negative.   Gastrointestinal: Negative.   Genitourinary: Negative.   Musculoskeletal: Positive for back pain, joint pain and neck pain. Negative for falls and myalgias.  Skin: Negative.   Neurological: Negative.  Negative for weakness and headaches.  Psychiatric/Behavioral: Positive for depression (decreased appetite, does not want to go out, has increased drinking, increase sleeping) and substance abuse. Negative for hallucinations, memory loss and suicidal ideas. The patient is nervous/anxious and has insomnia.     Physical Exam: Estimated body mass index is 32.68 kg/m as calculated from the following:   Height as of this encounter: 5' 11.5" (1.816 m).   Weight as of this encounter: 237 lb 9.6 oz (107.8 kg). BP 120/80   Pulse 92   Temp 97.2 F (36.2 C)   Resp 14   Ht 5' 11.5" (1.816 m)   Wt 237 lb 9.6 oz (107.8 kg)   SpO2 97%   BMI 32.68 kg/m  General Appearance: Obese, in no apparent distress.  Eyes: PERRLA, EOMs, conjunctiva no swelling or erythema, normal fundi and vessels.  Sinuses: No Frontal/maxillary tenderness  ENT/Mouth: Ext aud canals clear,  Crowded mouth, normal light reflex with TMs without erythema, bulging. Good dentition. No erythema, swelling, or exudate on post pharynx. Tonsils not swollen or erythematous. Hearing normal.  Neck: Supple, thyroid normal. No bruits  Respiratory: Respiratory effort normal, BS equal bilaterally without rales, rhonchi, wheezing or stridor.  Cardio: RRR without murmurs, rubs or gallops. Brisk peripheral pulses without edema.  Chest: symmetric, with normal excursions and percussion.  Abdomen: Soft, nontender, obese no guarding, rebound, hernias, masses, or organomegaly. .  Lymphatics: Non tender without lymphadenopathy.  Musculoskeletal: Full ROM all peripheral extremities,5/5  strength, and normal gait.  Skin: Warm, dry without rashes, lesions, ecchymosis. Neuro: Cranial nerves intact, reflexes equal bilaterally. Normal muscle tone, no cerebellar symptoms. Psych: Awake and oriented X 3, normal affect, Insight and Judgment appropriate.   Quentin MullingAmanda Seidy Labreck 9:32 AM [facility]

## 2016-06-09 NOTE — Progress Notes (Signed)
Pt aware of lab results & voiced understanding of those results.

## 2016-08-10 ENCOUNTER — Other Ambulatory Visit: Payer: Self-pay | Admitting: Physician Assistant

## 2016-08-10 DIAGNOSIS — Z0001 Encounter for general adult medical examination with abnormal findings: Secondary | ICD-10-CM

## 2016-08-10 DIAGNOSIS — E785 Hyperlipidemia, unspecified: Secondary | ICD-10-CM

## 2016-10-06 NOTE — Progress Notes (Signed)
3 month follow up  Assessment and Plan: Essential hypertension - continue medications, DASH diet, exercise and monitor at home. Call if greater than 130/80.  - BASIC METABOLIC PANEL WITH GFR - Hepatic function panel - TSH  Thrombocytopenia (HCC) Check CBC - CBC with Differential/Platelet   Sarcoidosis (HCC) Continue follow up pulmonary Stop smoking- will send in chantix, will try 1/2 pill when he wakes up Need CXR, delcines today   Overweight Obesity with co morbidities- long discussion about weight loss, diet, and exercise   Hyperlipidemia - check lipids, decrease fatty foods, increase activity.  - Lipid panel  Tobacco abuse Advised/counseled to quit smoking Will try chantix LONG DISCUSSION THAT WITH HIS SARCOID AND SMOKING AND THE POOR PROGNOSIS WILL TRY  Back pain Right SI pain, negative straight leg, no bowel/bladder problems Mobic, RICE, and exercise given If not better with refer to orthopedics.   Discussed med's effects and SE's. Screening labs and tests as requested with regular follow-up as recommended. Future Appointments Date Time Provider Department Center  02/08/2017 9:00 AM Quentin Mulling, PA-C [facility] None   HPI Patient presents for 3 month follow up for HTN, chol, sarcoidosis, and acute visit for depression.  Patient has history of back pain, states he twisted it the wrong way, has been going on x 2 months. Some right leg numbness/aching when in a recliner, no urinary issues, no weakness in that leg.   His blood pressure has been controlled at home, today their BP is BP: 122/80 He does not workout. He denies chest pain, shortness of breath, dizziness.  He is on cholesterol medication, fenofibrate and denies myalgias. His cholesterol is not at goal. The cholesterol last visit was:   Lab Results  Component Value Date   CHOL 159 06/07/2016   HDL 19 (L) 06/07/2016   LDLCALC 70 06/07/2016   TRIG 351 (H) 06/07/2016   CHOLHDL 8.4 (H) 06/07/2016    He  has been working on diet and exercise for prediabetes, and denies paresthesia of the feet, polydipsia and polyuria. Last A1C in the office was:  Lab Results  Component Value Date   HGBA1C 5.1 02/09/2016   Patient is on Vitamin D supplement.   Lab Results  Component Value Date   VD25OH 21 (L) 02/09/2016     He smokes and has sarcoidosis, follows with Dr. Shelle Iron and has seen Dr. Swazil[place] in the past.   He has a history of hepatitis C, treated on 04/22/2014, Harvoni. Has had Hep B vaccine.  BMI is Body mass index is 31.52 kg/m., he is working on diet and exercise. Wt Readings from Last 3 Encounters:  10/07/16 229 lb 3.2 oz (104 kg)  06/07/16 237 lb 9.6 oz (107.8 kg)  02/09/16 234 lb (106.1 kg)     Current Medications:  Current Outpatient Prescriptions on File Prior to Visit  Medication Sig Dispense Refill  . fenofibrate micronized (LOFIBRA) 134 MG capsule TAKE 1 CAPSULE BY MOUTH BEFORE BREAKFAST 90 capsule 0  . glucose blood (FREESTYLE LITE) test strip Use as instructed Dx code: E11.9 100 each 5  . Lancets (FREESTYLE) lancets Use as instructed Dx code:E11.9 100 each 5  . metFORMIN (GLUCOPHAGE-XR) 500 MG 24 hr tablet TAKE 1 TO 2 TABLETS TWICE A DAY FOR DIABETES 360 tablet 1  . triamcinolone cream (KENALOG) 0.1 % Apply 1 application topically 3 (three) times daily. 45 g 0  . esomeprazole (NEXIUM) 20 MG capsule Take 1 capsule (20 mg total) by mouth daily. 30 capsule 0  No current facility-administered medications on file prior to visit.    He has Sarcoidosis; Hyperlipidemia; Overweight; Hepatitis C; Prediabetes; Tobacco abuse; Hiatal hernia; Thrombocytopenia (HCC); Osteoarthritis; Colon polyps; DDD (degenerative disc disease), lumbar; Essential hypertension; Medication management; and Depression on his problem list.  Allergies:  Allergies  Allergen Reactions  . Chantix [Varenicline]     Nausea  . Lopid [Gemfibrozil]     Nausea/weakness   Surgical History: reviewed Family  History: reviewed Social History:  Reviewed  Review of Systems  Constitutional: Negative.  Negative for chills, diaphoresis, fever, malaise/fatigue and weight loss.  HENT: Negative for congestion, ear discharge, ear pain, hearing loss, nosebleeds, sore throat and tinnitus.   Eyes: Negative.   Respiratory: Negative.  Negative for shortness of breath and stridor.   Cardiovascular: Negative.   Gastrointestinal: Negative.   Genitourinary: Negative.   Musculoskeletal: Positive for back pain. Negative for falls, joint pain, myalgias and neck pain.  Skin: Negative.   Neurological: Negative.  Negative for weakness and headaches.  Psychiatric/Behavioral: Negative for depression, hallucinations, memory loss, substance abuse and suicidal ideas. The patient is not nervous/anxious and does not have insomnia.     Physical Exam: Estimated body mass index is 31.52 kg/m as calculated from the following:   Height as of this encounter: 5' 11.5" (1.816 m).   Weight as of this encounter: 229 lb 3.2 oz (104 kg). BP 122/80   Pulse 77   Temp 97.7 F (36.5 C)   Resp 16   Ht 5' 11.5" (1.816 m)   Wt 229 lb 3.2 oz (104 kg)   SpO2 99%   BMI 31.52 kg/m  General Appearance: Obese, in no apparent distress.  Eyes: PERRLA, EOMs, conjunctiva no swelling or erythema, normal fundi and vessels.  Sinuses: No Frontal/maxillary tenderness  ENT/Mouth: Ext aud canals clear,  Crowded mouth, normal light reflex with TMs without erythema, bulging. Good dentition. No erythema, swelling, or exudate on post pharynx. Tonsils not swollen or erythematous. Hearing normal.  Neck: Supple, thyroid normal. No bruits  Respiratory: Respiratory effort normal, BS equal bilaterally without rales, rhonchi, wheezing or stridor.  Cardio: RRR without murmurs, rubs or gallops. Brisk peripheral pulses without edema.  Chest: symmetric, with normal excursions and percussion.  Abdomen: Soft, nontender, obese no guarding, rebound, hernias, masses,  or organomegaly. .  Lymphatics: Non tender without lymphadenopathy.  Musculoskeletal: Full ROM all peripheral extremities,5/5 strength, and normal gait. Negative straight leg, normal distal neuro exam, + paraspinus muscle tenderness right side Skin: Warm, dry without rashes, lesions, ecchymosis. Neuro: Cranial nerves intact, reflexes equal bilaterally. Normal muscle tone, no cerebellar symptoms. Psych: Awake and oriented X 3, normal affect, Insight and Judgment appropriate.   Quentin MullingAmanda Ioana Louks 8:54 AM [facility]

## 2016-10-07 ENCOUNTER — Ambulatory Visit (INDEPENDENT_AMBULATORY_CARE_PROVIDER_SITE_OTHER): Payer: Managed Care, Other (non HMO) | Admitting: Physician Assistant

## 2016-10-07 ENCOUNTER — Encounter: Payer: Self-pay | Admitting: Physician Assistant

## 2016-10-07 VITALS — BP 122/80 | HR 77 | Temp 97.7°F | Resp 16 | Ht 71.5 in | Wt 229.2 lb

## 2016-10-07 DIAGNOSIS — Z79899 Other long term (current) drug therapy: Secondary | ICD-10-CM | POA: Diagnosis not present

## 2016-10-07 DIAGNOSIS — I10 Essential (primary) hypertension: Secondary | ICD-10-CM | POA: Diagnosis not present

## 2016-10-07 DIAGNOSIS — D696 Thrombocytopenia, unspecified: Secondary | ICD-10-CM

## 2016-10-07 DIAGNOSIS — E785 Hyperlipidemia, unspecified: Secondary | ICD-10-CM

## 2016-10-07 DIAGNOSIS — R7303 Prediabetes: Secondary | ICD-10-CM

## 2016-10-07 DIAGNOSIS — Z720 Tobacco use: Secondary | ICD-10-CM

## 2016-10-07 DIAGNOSIS — D869 Sarcoidosis, unspecified: Secondary | ICD-10-CM

## 2016-10-07 LAB — CBC WITH DIFFERENTIAL/PLATELET
BASOS ABS: 67 {cells}/uL (ref 0–200)
Basophils Relative: 1 %
EOS ABS: 335 {cells}/uL (ref 15–500)
Eosinophils Relative: 5 %
HCT: 46.9 % (ref 38.5–50.0)
Hemoglobin: 15.7 g/dL (ref 13.2–17.1)
LYMPHS PCT: 25 %
Lymphs Abs: 1675 cells/uL (ref 850–3900)
MCH: 29 pg (ref 27.0–33.0)
MCHC: 33.5 g/dL (ref 32.0–36.0)
MCV: 86.5 fL (ref 80.0–100.0)
MONOS PCT: 8 %
MPV: 10.2 fL (ref 7.5–12.5)
Monocytes Absolute: 536 cells/uL (ref 200–950)
Neutro Abs: 4087 cells/uL (ref 1500–7800)
Neutrophils Relative %: 61 %
PLATELETS: 302 10*3/uL (ref 140–400)
RBC: 5.42 MIL/uL (ref 4.20–5.80)
RDW: 13.6 % (ref 11.0–15.0)
WBC: 6.7 10*3/uL (ref 3.8–10.8)

## 2016-10-07 LAB — BASIC METABOLIC PANEL WITH GFR
BUN: 17 mg/dL (ref 7–25)
CHLORIDE: 106 mmol/L (ref 98–110)
CO2: 20 mmol/L (ref 20–31)
Calcium: 9.6 mg/dL (ref 8.6–10.3)
Creat: 1.15 mg/dL (ref 0.70–1.33)
GFR, Est African American: 80 mL/min (ref >=60)
GFR, Est Non African American: 69 mL/min (ref >=60)
Glucose, Bld: 186 mg/dL — ABNORMAL HIGH (ref 65–99)
Potassium: 4 mmol/L (ref 3.5–5.3)
Sodium: 139 mmol/L (ref 135–146)

## 2016-10-07 LAB — HEPATIC FUNCTION PANEL
ALBUMIN: 4.4 g/dL (ref 3.6–5.1)
ALT: 20 U/L (ref 9–46)
AST: 19 U/L (ref 10–35)
Alkaline Phosphatase: 72 U/L (ref 40–115)
BILIRUBIN TOTAL: 0.6 mg/dL (ref 0.2–1.2)
Bilirubin, Direct: 0.1 mg/dL (ref <=0.2)
Indirect Bilirubin: 0.5 mg/dL (ref 0.2–1.2)
TOTAL PROTEIN: 7 g/dL (ref 6.1–8.1)

## 2016-10-07 LAB — LIPID PANEL
Cholesterol: 166 mg/dL (ref <200)
HDL: 25 mg/dL — AB (ref >40)
LDL Cholesterol: 100 mg/dL — ABNORMAL HIGH (ref <100)
Total CHOL/HDL Ratio: 6.6 Ratio — ABNORMAL HIGH (ref <5.0)
Triglycerides: 205 mg/dL — ABNORMAL HIGH (ref <150)
VLDL: 41 mg/dL — ABNORMAL HIGH (ref <30)

## 2016-10-07 LAB — TSH: TSH: 0.93 m[IU]/L (ref 0.40–4.50)

## 2016-10-07 LAB — MAGNESIUM: MAGNESIUM: 1.9 mg/dL (ref 1.5–2.5)

## 2016-10-07 MED ORDER — VARENICLINE TARTRATE 1 MG PO TABS: 1.0000 mg | ORAL_TABLET | Freq: Two times a day (BID) | ORAL | 0 refills | Status: DC

## 2016-10-07 MED ORDER — MELOXICAM 15 MG PO TABS: ORAL_TABLET | ORAL | 0 refills | Status: DC

## 2016-10-07 NOTE — Patient Instructions (Signed)
If you have a smart phone, please look up Smoke Free app, this will help you stay on track and give you information about money you have saved, life that you have gained back and a ton of more information.   We are giving you chantix for smoking cessation. You can do it! And we are here to help! You may have heard some scary side effects about chantix, the three most common I hear about are nausea, crazy dreams and depression.  However, I like for my patients to try to stay on 1/2 a tablet twice a day rather than one tablet twice a day as normally prescribed. This helps decrease the chances of side effects and helps save money by making a one month prescription last two months  Please start the prescription this way:  Start 1/2 tablet by mouth once daily after food with a full glass of water for 3 days Then do 1/2 tablet by mouth twice daily for 4 days. During this first week you can smoke, but try to stop after this week.  At this point we have several options: 1) continue on 1/2 tablet twice a day- which I encourage you to do. You can stay on this dose the rest of the time on the medication or if you still feel the need to smoke you can do one of the two options below. 2) do one tablet in the morning and 1/2 in the evening which helps decrease dreams. 3) do one tablet twice a day.   What if I miss a dose? If you miss a dose, take it as soon as you can. If it is almost time for your next dose, take only that dose. Do not take double or extra doses.  What should I watch for while using this medicine? Visit your doctor or health care professional for regular check ups. Ask for ongoing advice and encouragement from your doctor or healthcare professional, friends, and family to help you quit. If you smoke while on this medication, quit again  Your mouth may get dry. Chewing sugarless gum or hard candy, and drinking plenty of water may help. Contact your doctor if the problem does not go away or is  severe.  You may get drowsy or dizzy. Do not drive, use machinery, or do anything that needs mental alertness until you know how this medicine affects you. Do not stand or sit up quickly, especially if you are an older patient.   The use of this medicine may increase the chance of suicidal thoughts or actions. Pay special attention to how you are responding while on this medicine. Any worsening of mood, or thoughts of suicide or dying should be reported to your health care professional right away.  ADVANTAGES OF QUITTING SMOKING  Within 20 minutes, blood pressure decreases. Your pulse is at normal level.  After 8 hours, carbon monoxide levels in the blood return to normal. Your oxygen level increases.  After 24 hours, the chance of having a heart attack starts to decrease. Your breath, hair, and body stop smelling like smoke.  After 48 hours, damaged nerve endings begin to recover. Your sense of taste and smell improve.  After 72 hours, the body is virtually free of nicotine. Your bronchial tubes relax and breathing becomes easier.  After 2 to 12 weeks, lungs can hold more air. Exercise becomes easier and circulation improves.  After 1 year, the risk of coronary heart disease is cut in half.  After 5 years,   the risk of stroke falls to the same as a nonsmoker.  After 10 years, the risk of lung cancer is cut in half and the risk of other cancers decreases significantly.  After 15 years, the risk of coronary heart disease drops, usually to the level of a nonsmoker.  You will have extra money to spend on things other than cigarettes.    Back Pain, Adult Back pain is very common in adults.The cause of back pain is rarely dangerous and the pain often gets better over time.The cause of your back pain may not be known. Some common causes of back pain include:  Strain of the muscles or ligaments supporting the spine.  Wear and tear (degeneration) of the spinal  disks.  Arthritis.  Direct injury to the back.  For many people, back pain may return. Since back pain is rarely dangerous, most people can learn to manage this condition on their own. Follow these instructions at home: Watch your back pain for any changes. The following actions may help to lessen any discomfort you are feeling:  Remain active. It is stressful on your back to sit or stand in one place for long periods of time. Do not sit, drive, or stand in one place for more than 30 minutes at a time. Take short walks on even surfaces as soon as you are able.Try to increase the length of time you walk each day.  Exercise regularly as directed by your health care provider. Exercise helps your back heal faster. It also helps avoid future injury by keeping your muscles strong and flexible.  Do not stay in bed.Resting more than 1-2 days can delay your recovery.  Pay attention to your body when you bend and lift. The most comfortable positions are those that put less stress on your recovering back. Always use proper lifting techniques, including: ? Bending your knees. ? Keeping the load close to your body. ? Avoiding twisting.  Find a comfortable position to sleep. Use a firm mattress and lie on your side with your knees slightly bent. If you lie on your back, put a pillow under your knees.  Avoid feeling anxious or stressed.Stress increases muscle tension and can worsen back pain.It is important to recognize when you are anxious or stressed and learn ways to manage it, such as with exercise.  Take medicines only as directed by your health care provider. Over-the-counter medicines to reduce pain and inflammation are often the most helpful.Your health care provider may prescribe muscle relaxant drugs.These medicines help dull your pain so you can more quickly return to your normal activities and healthy exercise.  Apply ice to the injured area: ? Put ice in a plastic bag. ? Place a  towel between your skin and the bag. ? Leave the ice on for 20 minutes, 2-3 times a day for the first 2-3 days. After that, ice and heat may be alternated to reduce pain and spasms.  Maintain a healthy weight. Excess weight puts extra stress on your back and makes it difficult to maintain good posture.  Contact a health care provider if:  You have pain that is not relieved with rest or medicine.  You have increasing pain going down into the legs or buttocks.  You have pain that does not improve in one week.  You have night pain.  You lose weight.  You have a fever or chills. Get help right away if:  You develop new bowel or bladder control problems.  You have  unusual weakness or numbness in your arms or legs.  You develop nausea or vomiting.  You develop abdominal pain.  You feel faint. This information is not intended to replace advice given to you by your health care provider. Make sure you discuss any questions you have with your health care provider. Document Released: 03/15/2005 Document Revised: 07/24/2015 Document Reviewed: 07/17/2013 Elsevier Interactive Patient Education  2017 [employer].

## 2016-10-08 LAB — HEMOGLOBIN A1C
HEMOGLOBIN A1C: 5.2 % (ref <5.7)
MEAN PLASMA GLUCOSE: 103 mg/dL

## 2016-10-08 NOTE — Progress Notes (Signed)
Pt aware of lab results & voiced understanding of those results.

## 2016-11-06 ENCOUNTER — Other Ambulatory Visit: Payer: Self-pay | Admitting: Physician Assistant

## 2016-11-06 DIAGNOSIS — E785 Hyperlipidemia, unspecified: Secondary | ICD-10-CM

## 2016-11-06 DIAGNOSIS — Z0001 Encounter for general adult medical examination with abnormal findings: Secondary | ICD-10-CM

## 2017-01-02 ENCOUNTER — Other Ambulatory Visit: Payer: Self-pay | Admitting: Physician Assistant

## 2017-02-08 ENCOUNTER — Ambulatory Visit (HOSPITAL_COMMUNITY)
Admission: RE | Admit: 2017-02-08 | Discharge: 2017-02-08 | Disposition: A | Discharge status: Home / Self Care | Payer: 59 | Source: Ambulatory Visit | Attending: Physician Assistant | Admitting: Physician Assistant

## 2017-02-08 ENCOUNTER — Ambulatory Visit (INDEPENDENT_AMBULATORY_CARE_PROVIDER_SITE_OTHER): Payer: 59 | Admitting: Physician Assistant

## 2017-02-08 ENCOUNTER — Encounter: Payer: Self-pay | Admitting: Physician Assistant

## 2017-02-08 VITALS — BP 126/72 | HR 88 | Temp 97.3°F | Resp 16 | Ht 72.0 in | Wt 233.0 lb

## 2017-02-08 DIAGNOSIS — M51369 Other intervertebral disc degeneration, lumbar region without mention of lumbar back pain or lower extremity pain: Secondary | ICD-10-CM

## 2017-02-08 DIAGNOSIS — K2100 Gastro-esophageal reflux disease with esophagitis, without bleeding: Secondary | ICD-10-CM

## 2017-02-08 DIAGNOSIS — M546 Pain in thoracic spine: Secondary | ICD-10-CM | POA: Diagnosis not present

## 2017-02-08 DIAGNOSIS — Z79899 Other long term (current) drug therapy: Secondary | ICD-10-CM

## 2017-02-08 DIAGNOSIS — K449 Diaphragmatic hernia without obstruction or gangrene: Secondary | ICD-10-CM

## 2017-02-08 DIAGNOSIS — R062 Wheezing: Secondary | ICD-10-CM | POA: Insufficient documentation

## 2017-02-08 DIAGNOSIS — R8290 Unspecified abnormal findings in urine: Secondary | ICD-10-CM

## 2017-02-08 DIAGNOSIS — I10 Essential (primary) hypertension: Secondary | ICD-10-CM

## 2017-02-08 DIAGNOSIS — F3341 Major depressive disorder, recurrent, in partial remission: Secondary | ICD-10-CM

## 2017-02-08 DIAGNOSIS — Z23 Encounter for immunization: Secondary | ICD-10-CM | POA: Diagnosis not present

## 2017-02-08 DIAGNOSIS — Z0001 Encounter for general adult medical examination with abnormal findings: Secondary | ICD-10-CM

## 2017-02-08 DIAGNOSIS — R7303 Prediabetes: Secondary | ICD-10-CM

## 2017-02-08 DIAGNOSIS — E663 Overweight: Secondary | ICD-10-CM

## 2017-02-08 DIAGNOSIS — D869 Sarcoidosis, unspecified: Secondary | ICD-10-CM

## 2017-02-08 DIAGNOSIS — Z720 Tobacco use: Secondary | ICD-10-CM | POA: Insufficient documentation

## 2017-02-08 DIAGNOSIS — E559 Vitamin D deficiency, unspecified: Secondary | ICD-10-CM

## 2017-02-08 DIAGNOSIS — E519 Thiamine deficiency, unspecified: Secondary | ICD-10-CM

## 2017-02-08 DIAGNOSIS — B1920 Unspecified viral hepatitis C without hepatic coma: Secondary | ICD-10-CM

## 2017-02-08 DIAGNOSIS — D696 Thrombocytopenia, unspecified: Secondary | ICD-10-CM

## 2017-02-08 DIAGNOSIS — R05 Cough: Secondary | ICD-10-CM | POA: Diagnosis not present

## 2017-02-08 DIAGNOSIS — M5136 Other intervertebral disc degeneration, lumbar region: Secondary | ICD-10-CM

## 2017-02-08 DIAGNOSIS — Z6831 Body mass index (BMI) 31.0-31.9, adult: Secondary | ICD-10-CM

## 2017-02-08 DIAGNOSIS — E785 Hyperlipidemia, unspecified: Secondary | ICD-10-CM

## 2017-02-08 DIAGNOSIS — Z125 Encounter for screening for malignant neoplasm of prostate: Secondary | ICD-10-CM

## 2017-02-08 DIAGNOSIS — R6889 Other general symptoms and signs: Secondary | ICD-10-CM | POA: Diagnosis not present

## 2017-02-08 DIAGNOSIS — K635 Polyp of colon: Secondary | ICD-10-CM

## 2017-02-08 MED ORDER — FENOFIBRATE MICRONIZED 134 MG PO CAPS: ORAL_CAPSULE | ORAL | 1 refills | Status: DC

## 2017-02-08 MED ORDER — PANTOPRAZOLE SODIUM 40 MG PO TBEC: 40.0000 mg | DELAYED_RELEASE_TABLET | Freq: Every day | ORAL | 0 refills | Status: DC

## 2017-02-08 MED ORDER — MELOXICAM 15 MG PO TABS: ORAL_TABLET | ORAL | 0 refills | Status: DC

## 2017-02-08 NOTE — Progress Notes (Signed)
Complete Physical  Assessment and Plan:  Encounter for general adult medical examination with abnormal findings 1 year  Essential hypertension - continue medications, DASH diet, exercise and monitor at home. Call if greater than 130/80.  -     CBC with Differential/Platelet -     BASIC METABOLIC PANEL WITH GFR -     Hepatic function panel -     TSH -     Urinalysis, Routine w reflex microscopic -     Microalbumin / creatinine urine ratio -     EKG 12-Lead -     DG Chest 2 View; Future  Hepatitis C virus infection without hepatic coma, unspecified chronicity Monitor liver function s/p treatment  Thrombocytopenia (HCC) -     fenofibrate micronized (LOFIBRA) 134 MG capsule; TAKE 1 CAPSULE BY MOUTH BEFORE BREAKFAST -     CBC with Differential/Platelet  Hyperlipidemia, unspecified hyperlipidemia type -continue medications, check lipids, decrease fatty foods, increase activity.  -     Lipid panel -     fenofibrate micronized (LOFIBRA) 134 MG capsule; TAKE 1 CAPSULE BY MOUTH BEFORE BREAKFAST  Sarcoidosis Advised to stop smoking, continue follow up, get CXR   Overweight  - long discussion about weight loss, diet, and exercise -recommended diet heavy in fruits and veggies and low in animal meats, cheeses, and dairy products  Prediabetes Discussed disease progression and risks Discussed diet/exercise, weight management and risk modification A1C  Tobacco abuse Smoking cessation-  instruction/counseling given, counseled patient on the dangers of tobacco use, advised patient to stop smoking, and reviewed strategies to maximize success, patient not ready to quit at this time.  -     EKG 12-Lead -     DG Chest 2 View; Future  Medication management -     Magnesium  Recurrent major depressive disorder, in partial remission (HCC) - continue medications, stress management techniques discussed, increase water, good sleep hygiene discussed, increase exercise, and increase veggies.    Hiatal hernia Follow up GI  Polyp of colon, unspecified part of colon, unspecified type Follow up GI  DDD (degenerative disc disease), lumbar Monitor  Vitamin D deficiency -     VITAMIN D 25 Hydroxy (Vit-D Deficiency, Fractures) - get back on medication  Thiamin deficiency -     Iron,Total/Total Iron Binding Cap -     Vitamin B12 - decrease drinking, continue B complex  Needs flu shot -     FLU VACCINE MDCK QUAD W/Preservative  BMI 31.0-31.9,adult  Gastroesophageal reflux disease with esophagitis -     pantoprazole (PROTONIX) 40 MG tablet; Take 1 tablet (40 mg total) daily by mouth. - start on protonix, follow up GI for EGD, may need ENT referral, declines at this time  Acute left-sided thoracic back pain -     EKG 12-Lead -     DG Chest 2 View; Future -     meloxicam (MOBIC) 15 MG tablet; TAKE 1 TABLET BY MOUTH EVERY DAY WITH FOOD FOR PAIN - ? IF FROM HIS BACK, NO PAIN WITH MOVEMENT/PALPATION, WILL GET CXR, CHECK LABS, IF NOT BETTER FOLLOW UP  Screening PSA (prostate specific antigen) -     PSA   Discussed med's effects and SE's. Screening labs and tests as requested with regular follow-up as recommended. Future Appointments  Date Time Provider Department Center  02/09/2018  9:00 AM Quentin Mulling, PA-C [facility] None    HPI Patient presents for a complete physical and follow up. [name]   He complains of back pain  x 1 month, when he coughs has a sharp pain at left mid back. No fever, chills. No productive cough, no SOB, CP, no urinary issues, blood in urine. Patient denies fever, hematuria, incontinence, numbness, tingling, weakness and saddle anesthesia  He smokes and has sarcoidosis, follows with Dr. Shelle Ironlance and has seen Dr. [place] in the past, he tried chantix but states he could not continue due to side effects, even just on 1/2 pill twice a day.    He has had some reflux and trouble with swallowing, not on nexium. No weight loss, no fever chills.     His  blood pressure has been controlled at home, today their BP is BP: 126/72 He does not workout. He denies chest pain, shortness of breath, dizziness.  He is on cholesterol medication, fenofibrate and denies myalgias. His cholesterol is not at goal. The cholesterol last visit was:   Lab Results  Component Value Date   CHOL 166 10/07/2016   HDL 25 (L) 10/07/2016   LDLCALC 100 (H) 10/07/2016   TRIG 205 (H) 10/07/2016   CHOLHDL 6.6 (H) 10/07/2016    He has been working on diet and exercise for prediabetes, he is on metformin, and denies paresthesia of the feet, polydipsia and polyuria. Last A1C in the office was:  Lab Results  Component Value Date   HGBA1C 5.2 10/07/2016   Patient is on Vitamin D supplement.   Lab Results  Component Value Date   VD25OH 21 (L) 02/09/2016     Last PSA is  Lab Results  Component Value Date   PSA 1.1 02/09/2016   PSA 1.20 02/07/2015   PSA 1.14 02/01/2014   He did have low thimine and has been on B complex.  He has a history of hepatitis C, followed with Dr. Lucas MallowZamar, treated on 04/22/2014, Harvoni, and he has decreased his beer drinking, has been drinking Odoul[employer]. Has had Hep B vaccine, no longer needs to follow up, will follow up here.   BMI is Body mass index is 31.6 kg/m., he is working on diet and exercise. Wt Readings from Last 3 Encounters:  02/08/17 233 lb (105.7 kg)  10/07/16 229 lb 3.2 oz (104 kg)  06/07/16 237 lb 9.6 oz (107.8 kg)     Current Medications:  Current Outpatient Medications on File Prior to Visit  Medication Sig Dispense Refill  . fenofibrate micronized (LOFIBRA) 134 MG capsule TAKE 1 CAPSULE BY MOUTH BEFORE BREAKFAST 90 capsule 1  . glucose blood (FREESTYLE LITE) test strip Use as instructed Dx code: E11.9 100 each 5  . Lancets (FREESTYLE) lancets Use as instructed Dx code:E11.9 100 each 5  . meloxicam (MOBIC) 15 MG tablet TAKE 1 TABLET BY MOUTH EVERY DAY WITH FOOD FOR PAIN 90 tablet 0  . metFORMIN (GLUCOPHAGE-XR) 500 MG 24  hr tablet TAKE 1 TO 2 TABLETS TWICE A DAY FOR DIABETES 360 tablet 1  . triamcinolone cream (KENALOG) 0.1 % Apply 1 application topically 3 (three) times daily. 45 g 0  . varenicline (CHANTIX CONTINUING MONTH PAK) 1 MG tablet Take 1 tablet (1 mg total) by mouth 2 (two) times daily. 60 tablet 0  . esomeprazole (NEXIUM) 20 MG capsule Take 1 capsule (20 mg total) by mouth daily. 30 capsule 0   No current facility-administered medications on file prior to visit.    Health Maintenance:  Immunization History  Administered Date(s) Administered  . Hepatitis B 06/24/2014  . Influenza Inj Mdck Quad With Preservative 02/08/2017  . Influenza Whole 01/17/2012  .  Influenza, Seasonal, Injecte, Preservative Fre 02/09/2016  . Pneumococcal Conjugate-13 12/28/2011  . Pneumococcal Polysaccharide-23 10/03/2012  . Tdap 10/04/2011    Tetanus: 09/2011 Pneumovax: 2014 Prevnar 13: 2013 Flu vaccine: TODAY Zostavax: N/A  Hep B: 2016 DEXA: Colonoscopy: 12/2013 Dr. Christella Hartigan EGD: Heart cath 04/2013  Allergies:  Allergies  Allergen Reactions  . Chantix [Varenicline]     Nausea  . Lopid [Gemfibrozil]     Nausea/weakness   Medical History:  Past Medical History:  Diagnosis Date  . Chest pain 05/16/2012  . Colon polyps   . DDD (degenerative disc disease)   . Diabetes mellitus without complication (HCC)   . Hepatitis C   . Hiatal hernia   . Hypercholesteremia   . Marijuana use   . Osteoarthritis   . Sarcoid   . Thrombocytopenia (HCC)   . Tobacco abuse    SURGICAL HISTORY He  has a past surgical history that includes Appendectomy; HEAD TRAUMA (1975); and LEFT HEART CATHETERIZATION WITH CORONARY ANGIOGRAM (N/A, 05/17/2012). FAMILY HISTORY His family history includes Cancer in his father; Diabetes Mellitus II in his mother; Leukemia in his mother; Other in his unknown relative. SOCIAL HISTORY He  reports that he has been smoking cigarettes.  He has a 15.00 pack-year smoking history. he has never used  smokeless tobacco. He reports that he drinks about 3.6 oz of alcohol per week. He reports that he uses drugs. Drug: Marijuana. Frequency: 2.00 times per week.  Review of Systems  Constitutional: Negative.  Negative for chills, diaphoresis, fever, malaise/fatigue and weight loss.  HENT: Negative for congestion, ear discharge, ear pain, hearing loss, nosebleeds, sore throat and tinnitus.   Eyes: Negative.   Respiratory: Negative for cough, shortness of breath and stridor.   Cardiovascular: Negative.  Negative for leg swelling.  Gastrointestinal: Negative.   Genitourinary: Negative.   Musculoskeletal: Positive for back pain and joint pain. Negative for falls, myalgias and neck pain.  Skin: Negative.   Neurological: Negative.  Negative for weakness and headaches.  Psychiatric/Behavioral: Negative.      Physical Exam: Estimated body mass index is 31.6 kg/m as calculated from the following:   Height as of this encounter: 6' (1.829 m).   Weight as of this encounter: 233 lb (105.7 kg). BP 126/72   Pulse 88   Temp (!) 97.3 F (36.3 C)   Resp 16   Ht 6' (1.829 m)   Wt 233 lb (105.7 kg)   SpO2 99%   BMI 31.60 kg/m  General Appearance: Obese, in no apparent distress.  Eyes: PERRLA, EOMs, conjunctiva no swelling or erythema, normal fundi and vessels.  Sinuses: No Frontal/maxillary tenderness  ENT/Mouth: Ext aud canals clear,  Crowded mouth, normal light reflex with TMs without erythema, bulging. Good dentition. No erythema, swelling, or exudate on post pharynx. Tonsils not swollen or erythematous. Hearing normal.  Neck: Supple, thyroid normal. No bruits  Respiratory: Respiratory effort normal, + expiratory wheezing LLL,  without rales, rhonchi, or stridor.  Cardio: RRR without murmurs, rubs or gallops. Brisk peripheral pulses without edema.  Chest: symmetric, with normal excursions and percussion.  Abdomen: Soft, nontender, obese no guarding, rebound, hernias, masses, or organomegaly. .   Lymphatics: Non tender without lymphadenopathy.  Genitourinary: defer Musculoskeletal: Full ROM all peripheral extremities,5/5 strength, and normal gait. No pain to palpation left thoracic back, no pain with rotation or flexion, no CVA tenderness, no rash/warmth/redness. Negative straight leg raise.  Skin: Warm, dry without rashes, lesions, ecchymosis. Neuro: Cranial nerves intact, reflexes equal bilaterally. Normal muscle  tone, no cerebellar symptoms. Psych: Awake and oriented X 3, normal affect, Insight and Judgment appropriate.   EKG: WNL no changes. Aorta scan defer  Quentin MullingAmanda Jonpaul Lumm 9:29 AM [facility]

## 2017-02-08 NOTE — Patient Instructions (Addendum)
Get on B complex Get on 5000 IU vitamin D   Steps to Quit Smoking Smoking tobacco can be harmful to your health and can affect almost every organ in your body. Smoking puts you, and those around you, at risk for developing many serious chronic diseases. Quitting smoking is difficult, but it is one of the best things that you can do for your health. It is never too late to quit. What are the benefits of quitting smoking? When you quit smoking, you lower your risk of developing serious diseases and conditions, such as:  Lung cancer or lung disease, such as COPD.  Heart disease.  Stroke.  Heart attack.  Infertility.  Osteoporosis and bone fractures.  Additionally, symptoms such as coughing, wheezing, and shortness of breath may get better when you quit. You may also find that you get sick less often because your body is stronger at fighting off colds and infections. If you are pregnant, quitting smoking can help to reduce your chances of having a baby of low birth weight. How do I get ready to quit? When you decide to quit smoking, create a plan to make sure that you are successful. Before you quit:  Pick a date to quit. Set a date within the next two weeks to give you time to prepare.  Write down the reasons why you are quitting. Keep this list in places where you will see it often, such as on your bathroom mirror or in your car or wallet.  Identify the people, places, things, and activities that make you want to smoke (triggers) and avoid them. Make sure to take these actions: ? Throw away all cigarettes at home, at work, and in your car. ? Throw away smoking accessories, such as Set designerashtrays and lighters. ? Clean your car and make sure to empty the ashtray. ? Clean your home, including curtains and carpets.  Tell your family, friends, and coworkers that you are quitting. Support from your loved ones can make quitting easier.  Talk with your health care provider about your options for  quitting smoking.  Find out what treatment options are covered by your health insurance.  What strategies can I use to quit smoking? Talk with your healthcare provider about different strategies to quit smoking. Some strategies include:  Quitting smoking altogether instead of gradually lessening how much you smoke over a period of time. Research shows that quitting "cold [place]key" is more successful than gradually quitting.  Attending in-person counseling to help you build problem-solving skills. You are more likely to have success in quitting if you attend several counseling sessions. Even short sessions of 10 minutes can be effective.  Finding resources and support systems that can help you to quit smoking and remain smoke-free after you quit. These resources are most helpful when you use them often. They can include: ? Online chats with a Veterinary surgeoncounselor. ? Telephone quitlines. ? Automotive engineerrinted self-help materials. ? Support groups or group counseling. ? Text messaging programs. ? Mobile phone applications.  Taking medicines to help you quit smoking. (If you are pregnant or breastfeeding, talk with your health care provider first.) Some medicines contain nicotine and some do not. Both types of medicines help with cravings, but the medicines that include nicotine help to relieve withdrawal symptoms. Your health care provider may recommend: ? Nicotine patches, gum, or lozenges. ? Nicotine inhalers or sprays. ? Non-nicotine medicine that is taken by mouth.  Talk with your health care provider about combining strategies, such as taking medicines  while you are also receiving in-person counseling. Using these two strategies together makes you more likely to succeed in quitting than if you used either strategy on its own. If you are pregnant or breastfeeding, talk with your health care provider about finding counseling or other support strategies to quit smoking. Do not take medicine to help you quit smoking  unless told to do so by your health care provider. What things can I do to make it easier to quit? Quitting smoking might feel overwhelming at first, but there is a lot that you can do to make it easier. Take these important actions:  Reach out to your family and friends and ask that they support and encourage you during this time. Call telephone quitlines, reach out to support groups, or work with a counselor for support.  Ask people who smoke to avoid smoking around you.  Avoid places that trigger you to smoke, such as bars, parties, or smoke-break areas at work.  Spend time around people who do not smoke.  Lessen stress in your life, because stress can be a smoking trigger for some people. To lessen stress, try: ? Exercising regularly. ? Deep-breathing exercises. ? Yoga. ? Meditating. ? Performing a body scan. This involves closing your eyes, scanning your body from head to toe, and noticing which parts of your body are particularly tense. Purposefully relax the muscles in those areas.  Download or purchase mobile phone or tablet apps (applications) that can help you stick to your quit plan by providing reminders, tips, and encouragement. There are many free apps, such as QuitGuide from the [employer] developer(Centers for Disease Control and Prevention). You can find other support for quitting smoking (smoking cessation) through smokefree.gov and other websites.  How will I feel when I quit smoking? Within the first 24 hours of quitting smoking, you may start to feel some withdrawal symptoms. These symptoms are usually most noticeable 2-3 days after quitting, but they usually do not last beyond 2-3 weeks. Changes or symptoms that you might experience include:  Mood swings.  Restlessness, anxiety, or irritation.  Difficulty concentrating.  Dizziness.  Strong cravings for sugary foods in addition to nicotine.  Mild weight gain.  Constipation.  Nausea.  Coughing or a sore throat.  Changes  in how your medicines work in your body.  A depressed mood.  Difficulty sleeping (insomnia).  After the first 2-3 weeks of quitting, you may start to notice more positive results, such as:  Improved sense of smell and taste.  Decreased coughing and sore throat.  Slower heart rate.  Lower blood pressure.  Clearer skin.  The ability to breathe more easily.  Fewer sick days.  Quitting smoking is very challenging for most people. Do not get discouraged if you are not successful the first time. Some people need to make many attempts to quit before they achieve long-term success. Do your best to stick to your quit plan, and talk with your health care provider if you have any questions or concerns. This information is not intended to replace advice given to you by your health care provider. Make sure you discuss any questions you have with your health care provider. Document Released: 03/09/2001 Document Revised: 11/11/2015 Document Reviewed: 07/30/2014 Elsevier Interactive Patient Education  2017 [employer]

## 2017-02-08 NOTE — Progress Notes (Signed)
LVM for pt to return office call for LAB results.

## 2017-02-09 ENCOUNTER — Other Ambulatory Visit: Payer: Self-pay

## 2017-02-09 DIAGNOSIS — R8290 Unspecified abnormal findings in urine: Secondary | ICD-10-CM

## 2017-02-09 LAB — URINALYSIS, ROUTINE W REFLEX MICROSCOPIC
Bilirubin Urine: NEGATIVE
GLUCOSE, UA: NEGATIVE
HGB URINE DIPSTICK: NEGATIVE
HYALINE CAST: NONE SEEN /LPF
KETONES UR: NEGATIVE
Nitrite: POSITIVE — AB
PH: 6 (ref 5.0–8.0)
Protein, ur: NEGATIVE
RBC / HPF: NONE SEEN /HPF (ref 0–2)
SPECIFIC GRAVITY, URINE: 1.019 (ref 1.001–1.03)

## 2017-02-09 LAB — CBC WITH DIFFERENTIAL/PLATELET
BASOS PCT: 0.8 %
Basophils Absolute: 62 cells/uL (ref 0–200)
EOS PCT: 4.3 %
Eosinophils Absolute: 331 cells/uL (ref 15–500)
HEMATOCRIT: 42.5 % (ref 38.5–50.0)
HEMOGLOBIN: 14.4 g/dL (ref 13.2–17.1)
LYMPHS ABS: 1810 {cells}/uL (ref 850–3900)
MCH: 28.5 pg (ref 27.0–33.0)
MCHC: 33.9 g/dL (ref 32.0–36.0)
MCV: 84.2 fL (ref 80.0–100.0)
MPV: 10.2 fL (ref 7.5–12.5)
Monocytes Relative: 8.4 %
NEUTROS PCT: 63 %
Neutro Abs: 4851 cells/uL (ref 1500–7800)
Platelets: 227 10*3/uL (ref 140–400)
RBC: 5.05 10*6/uL (ref 4.20–5.80)
RDW: 12.8 % (ref 11.0–15.0)
Total Lymphocyte: 23.5 %
WBC: 7.7 10*3/uL (ref 3.8–10.8)
WBCMIX: 647 {cells}/uL (ref 200–950)

## 2017-02-09 LAB — BASIC METABOLIC PANEL WITH GFR
BUN: 12 mg/dL (ref 7–25)
CALCIUM: 9.6 mg/dL (ref 8.6–10.3)
CHLORIDE: 108 mmol/L (ref 98–110)
CO2: 25 mmol/L (ref 20–32)
Creat: 0.99 mg/dL (ref 0.70–1.33)
GFR, EST NON AFRICAN AMERICAN: 83 mL/min/{1.73_m2} (ref >=60)
GFR, Est African American: 96 mL/min/{1.73_m2} (ref >=60)
Glucose, Bld: 115 mg/dL — ABNORMAL HIGH (ref 65–99)
POTASSIUM: 4.4 mmol/L (ref 3.5–5.3)
Sodium: 140 mmol/L (ref 135–146)

## 2017-02-09 LAB — IRON, TOTAL/TOTAL IRON BINDING CAP
%SAT: 15 % (ref 15–60)
IRON: 66 ug/dL (ref 50–180)
TIBC: 431 ug/dL — AB (ref 250–425)

## 2017-02-09 LAB — LIPID PANEL
CHOL/HDL RATIO: 6.3 (calc) — AB (ref <5.0)
CHOLESTEROL: 151 mg/dL (ref <200)
HDL: 24 mg/dL — ABNORMAL LOW (ref >40)
LDL CHOLESTEROL (CALC): 91 mg/dL
Non-HDL Cholesterol (Calc): 127 mg/dL (calc) (ref <130)
TRIGLYCERIDES: 275 mg/dL — AB (ref <150)

## 2017-02-09 LAB — HEPATIC FUNCTION PANEL
AG Ratio: 1.7 (calc) (ref 1.0–2.5)
ALBUMIN MSPROF: 4.5 g/dL (ref 3.6–5.1)
ALT: 18 U/L (ref 9–46)
AST: 18 U/L (ref 10–35)
Alkaline phosphatase (APISO): 66 U/L (ref 40–115)
BILIRUBIN TOTAL: 0.5 mg/dL (ref 0.2–1.2)
Bilirubin, Direct: 0.1 mg/dL (ref 0.0–0.2)
GLOBULIN: 2.6 g/dL (ref 1.9–3.7)
Indirect Bilirubin: 0.4 mg/dL (calc) (ref 0.2–1.2)
Total Protein: 7.1 g/dL (ref 6.1–8.1)

## 2017-02-09 LAB — MICROALBUMIN / CREATININE URINE RATIO
Creatinine, Urine: 117 mg/dL (ref 20–320)
Microalb Creat Ratio: 22 mcg/mg creat (ref <30)
Microalb, Ur: 2.6 mg/dL

## 2017-02-09 LAB — PSA: PSA: 1.5 ng/mL (ref <=4.0)

## 2017-02-09 LAB — VITAMIN D 25 HYDROXY (VIT D DEFICIENCY, FRACTURES): Vit D, 25-Hydroxy: 20 ng/mL — ABNORMAL LOW (ref 30–100)

## 2017-02-09 LAB — MAGNESIUM: Magnesium: 2.1 mg/dL (ref 1.5–2.5)

## 2017-02-09 LAB — VITAMIN B12: Vitamin B-12: 493 pg/mL (ref 200–1100)

## 2017-02-09 NOTE — Progress Notes (Signed)
LVM for pt to return office call for LAB results.

## 2017-02-09 NOTE — Progress Notes (Signed)
Pt aware of lab results & voiced understanding of those results. MESSAGE SENT TO LAB FOR U/C ADD ON

## 2017-02-09 NOTE — Progress Notes (Signed)
Pt aware of lab results & voiced understanding of those results.

## 2017-02-09 NOTE — Addendum Note (Signed)
Addended by: Emerson MonteICKENS, Luretta Everly S on: 02/09/2017 09:05 AM   Modules accepted: Orders

## 2017-02-09 NOTE — Progress Notes (Signed)
URINE CULTURE ADD ON ORDER HAS BEEN SENT TO LAB.

## 2017-02-12 ENCOUNTER — Other Ambulatory Visit: Payer: Self-pay | Admitting: Physician Assistant

## 2017-02-12 DIAGNOSIS — N3000 Acute cystitis without hematuria: Secondary | ICD-10-CM

## 2017-02-12 LAB — URINE CULTURE
MICRO NUMBER:: 81285059
SPECIMEN QUALITY: ADEQUATE

## 2017-02-12 MED ORDER — SULFAMETHOXAZOLE-TRIMETHOPRIM 800-160 MG PO TABS: 1.0000 | ORAL_TABLET | Freq: Two times a day (BID) | ORAL | 0 refills | Status: DC

## 2017-03-11 ENCOUNTER — Ambulatory Visit (INDEPENDENT_AMBULATORY_CARE_PROVIDER_SITE_OTHER): Payer: 59

## 2017-03-11 DIAGNOSIS — R8271 Bacteriuria: Secondary | ICD-10-CM | POA: Diagnosis not present

## 2017-03-11 DIAGNOSIS — N3000 Acute cystitis without hematuria: Secondary | ICD-10-CM | POA: Diagnosis not present

## 2017-03-11 NOTE — Progress Notes (Signed)
Pt presents for U/C & UA. Pt reports he has finished his ABX & has no other concerns at this time.

## 2017-03-12 LAB — URINALYSIS, ROUTINE W REFLEX MICROSCOPIC
BILIRUBIN URINE: NEGATIVE
GLUCOSE, UA: NEGATIVE
Hgb urine dipstick: NEGATIVE
Ketones, ur: NEGATIVE
Leukocytes, UA: NEGATIVE
Nitrite: NEGATIVE
PROTEIN: NEGATIVE
Specific Gravity, Urine: 1.022 (ref 1.001–1.03)
pH: 5.5 (ref 5.0–8.0)

## 2017-03-12 LAB — URINE CULTURE
MICRO NUMBER: 81408339
RESULT: NO GROWTH
SPECIMEN QUALITY: ADEQUATE

## 2017-04-04 ENCOUNTER — Other Ambulatory Visit: Payer: Self-pay | Admitting: Physician Assistant

## 2017-04-29 ENCOUNTER — Other Ambulatory Visit: Payer: Self-pay

## 2017-04-29 MED ORDER — FREESTYLE LANCETS MISC: 5 refills | Status: DC

## 2017-05-02 ENCOUNTER — Other Ambulatory Visit: Payer: Self-pay

## 2017-05-02 MED ORDER — FREESTYLE LANCETS MISC: 5 refills | Status: DC

## 2017-05-03 ENCOUNTER — Other Ambulatory Visit: Payer: Self-pay | Admitting: *Deleted

## 2017-05-03 MED ORDER — GLUCOSE BLOOD VI STRP: ORAL_STRIP | 5 refills | Status: DC

## 2017-05-04 ENCOUNTER — Telehealth: Payer: Self-pay | Admitting: *Deleted

## 2017-05-04 NOTE — Telephone Encounter (Signed)
Patient called with a blood sugar reading of 158 and was concerned. Per Dr Oneta RackMcKeown, the blood sugar is not dangerously high and advised the patient to continue his medication daily and watch his diet.

## 2017-07-25 ENCOUNTER — Encounter: Payer: Self-pay | Admitting: Physician Assistant

## 2017-07-25 ENCOUNTER — Ambulatory Visit (INDEPENDENT_AMBULATORY_CARE_PROVIDER_SITE_OTHER): Payer: 59 | Admitting: Physician Assistant

## 2017-07-25 VITALS — BP 120/76 | HR 63 | Temp 97.7°F | Resp 16 | Ht 72.0 in | Wt 237.6 lb

## 2017-07-25 DIAGNOSIS — E663 Overweight: Secondary | ICD-10-CM

## 2017-07-25 DIAGNOSIS — Z720 Tobacco use: Secondary | ICD-10-CM

## 2017-07-25 DIAGNOSIS — M542 Cervicalgia: Secondary | ICD-10-CM | POA: Diagnosis not present

## 2017-07-25 DIAGNOSIS — D696 Thrombocytopenia, unspecified: Secondary | ICD-10-CM | POA: Diagnosis not present

## 2017-07-25 DIAGNOSIS — I10 Essential (primary) hypertension: Secondary | ICD-10-CM | POA: Diagnosis not present

## 2017-07-25 DIAGNOSIS — E785 Hyperlipidemia, unspecified: Secondary | ICD-10-CM

## 2017-07-25 DIAGNOSIS — D869 Sarcoidosis, unspecified: Secondary | ICD-10-CM

## 2017-07-25 MED ORDER — PREDNISONE 20 MG PO TABS: ORAL_TABLET | ORAL | 0 refills | Status: AC

## 2017-07-25 MED ORDER — BACLOFEN 10 MG PO TABS: 10.0000 mg | ORAL_TABLET | Freq: Two times a day (BID) | ORAL | 1 refills | Status: DC

## 2017-07-25 NOTE — Progress Notes (Signed)
Subjective:    Patient ID: Jerry Stewart, male    DOB: June 20, 1955, 62 y.o.   MRN: [id number]  HPI 62 y.o. smoking AAM with history of sarcoidosis presents with left neck/shoulder pain and 6 month follow up.  His blood pressure has been controlled at home, today their BP is BP: 120/76  He does not workout. He denies chest pain, shortness of breath, dizziness.  He is not on cholesterol medication and denies myalgias. His cholesterol is at goal. The cholesterol last visit was:   Lab Results  Component Value Date   CHOL 151 02/08/2017   HDL 24 (L) 02/08/2017   LDLCALC 91 02/08/2017   TRIG 275 (H) 02/08/2017   CHOLHDL 6.3 (H) 02/08/2017   Last A1C in the office was:  Lab Results  Component Value Date   HGBA1C 5.2 10/07/2016   Patient is on Vitamin D supplement.   Lab Results  Component Value Date   VD25OH 20 (L) 02/08/2017     BMI is Body mass index is 32.22 kg/m., he is working on diet and exercise. Wt Readings from Last 3 Encounters:  07/25/17 237 lb 9.6 oz (107.8 kg)  02/08/17 233 lb (105.7 kg)  10/07/16 229 lb 3.2 oz (104 kg)    Where he works is very active, he has to lift very heavy material and he also has been doing yard work. He states last week he had stiffness, pain left shoulder/neck area and he has had some swelling in his neck. Throbbing pain. Friday at work he was not able to turn his neck due to pain.  Has been taking Mobic, heating pad. He denies any arm weakness, numbness, tingling, dizziness, fever, chills. No CP or SOB.   He states his hand stay swollen, has numbness under his buttocks.    MRI cervical spine 10/2012 IMPRESSION: Overall mild spondylosis most notable at C3-4 where there is a disc bulge and a very shallow right paracentral protrusion.  The disc contacts the cord without deforming it and uncovertebral disease causes mild to moderate foraminal narrowing.  CXR 01/2017 normal Carotid US 2014 Echo 2011  Blood pressure 120/76, pulse 63,  temperature 97.7 F (36.5 C), resp. rate 16, height 6' (1.829 m), weight 237 lb 9.6 oz (107.8 kg), SpO2 97 %.  Medications Current Outpatient Medications on File Prior to Visit  Medication Sig  . fenofibrate micronized (LOFIBRA) 134 MG capsule TAKE 1 CAPSULE BY MOUTH BEFORE BREAKFAST  . glucose blood (FREESTYLE LITE) test strip [id number]  Check blood sugar 1 time a day  . Lancets (FREESTYLE) lancets Use as instructed test sugars 1 daily Dx code:E11.9  . meloxicam (MOBIC) 15 MG tablet TAKE 1 TABLET BY MOUTH EVERY DAY WITH FOOD FOR PAIN  . metFORMIN (GLUCOPHAGE-XR) 500 MG 24 hr tablet TAKE 1 TO 2 TABLETS TWICE A DAY FOR DIABETES  . pantoprazole (PROTONIX) 40 MG tablet Take 1 tablet (40 mg total) daily by mouth.  . triamcinolone cream (KENALOG) 0.1 % Apply 1 application topically 3 (three) times daily.   No current facility-administered medications on file prior to visit.     Problem list He has Sarcoidosis; Hyperlipidemia; Overweight; Hepatitis C; Prediabetes; Tobacco abuse; Hiatal hernia; Thrombocytopenia (HCC); Colon polyps; DDD (degenerative disc disease), lumbar; Essential hypertension; Medication management; Depression; and Abnormal urine on their problem list.   Review of Systems  Constitutional: Negative.  Negative for chills and fever.  HENT: Negative.   Respiratory: Negative.   Cardiovascular: Negative.   Gastrointestinal: Negative.  Genitourinary: Negative.  Negative for flank pain, frequency, hematuria and urgency.  Musculoskeletal: Positive for back pain and neck pain. Negative for arthralgias and gait problem.  Skin: Negative.  Negative for rash.  Neurological: Negative for tremors, weakness and numbness.  Psychiatric/Behavioral: Negative.        Objective:   Physical Exam  Constitutional: He is oriented to person, place, and time. He appears well-developed and well-nourished.  HENT:  Head: Normocephalic and atraumatic.  Cardiovascular: Normal rate and regular  rhythm.  Pulmonary/Chest: Effort normal and breath sounds normal.  Abdominal: Soft. Bowel sounds are normal. There is no tenderness.  Musculoskeletal:  Pain with rotation to the left, without spinous process tenderness, with paraspinal muscle tenderness the left side, normal sensation, reflexes, and pulses distal.  Neurological: He is alert and oriented to person, place, and time. He has normal reflexes.  Skin: No rash noted.       Assessment & Plan:   Essential hypertension - continue medications, DASH diet, exercise and monitor at home. Call if greater than 130/80.   Thrombocytopenia (HCC) Check CBC next OV, declines labs this visit.   Hyperlipidemia, unspecified hyperlipidemia type -continue medications, check lipids, decrease fatty foods, increase activity.   Tobacco abuse Smoking cessation-  instruction/counseling given, counseled patient on the dangers of tobacco use, advised patient to stop smoking, and reviewed strategies to maximize success, patient not ready to quit at this time.   Overweight  Overweight  - long discussion about weight loss, diet, and exercise -recommended diet heavy in fruits and veggies and low in animal meats, cheeses, and dairy products  Sarcoidosis Advised to stop smoking  Neck pain, acute -     predniSONE (DELTASONE) 20 MG tablet; 3 tablets daily with food for 3 days, 2 tabs daily for 3 days, 1 tab a day for 5 days. -     baclofen (LIORESAL) 10 MG tablet; Take 1 tablet (10 mg total) by mouth 2 (two) times daily. Take 1/2 to 1 tablet 2 x day if needed for muscle spasm - stretches given, take medication, if not better will refer to ortho    Future Appointments  Date Time Provider Department Center  08/10/2017  4:30 PM Quentin Mulling, PA-C [facility] None  02/09/2018  9:00 AM Quentin Mulling, PA-C [facility] None

## 2017-07-25 NOTE — Patient Instructions (Addendum)
Try the exercises and other information below, meloxicam once during the day as needed (avoid taking other NSAIDS like Alleve or Ibuprofen while taking this) and then flexeril if needed at bedtime for muscle spasm. This can be taken up to every 8 hours, but causes sedation, so should not drive or operate heavy machinery while taking this medicine.   Go to the ER if you have any new weakness in your arms, trouble with your grip, worse headache ever, fever, chills. or have worsening pain.   If you are not better in 1-3 month we will refer you to ortho   Cervical Sprain A cervical sprain is a stretch or tear in one or more of the tough, cord-like tissues that connect bones (ligaments) in the neck. Cervical sprains can range from mild to severe. Severe cervical sprains can cause the spinal bones (vertebrae) in the neck to be unstable. This can lead to spinal cord damage and can result in serious nervous system problems. The amount of time that it takes for a cervical sprain to get better depends on the cause and extent of the injury. Most cervical sprains heal in 4-6 weeks. What are the causes? Cervical sprains may be caused by an injury (trauma), such as from a motor vehicle accident, a fall, or sudden forward and backward whipping movement of the head and neck (whiplash injury). Mild cervical sprains may be caused by wear and tear over time, such as from poor posture, sitting in a chair that does not provide support, or looking up or down for long periods of time. What increases the risk? The following factors may make you more likely to develop this condition:  Participating in activities that have a high risk of trauma to the neck. These include contact sports, auto racing, gymnastics, and diving.  Taking risks when driving or riding in a motor vehicle, such as speeding.  Having osteoarthritis of the spine.  Having poor strength and flexibility of the neck.  A previous neck injury.  Having  poor posture.  Spending a lot of time in certain positions that put stress on the neck, such as sitting at a computer for long periods of time.  What are the signs or symptoms? Symptoms of this condition include:  Pain, soreness, stiffness, tenderness, swelling, or a burning sensation in the front, back, or sides of the neck.  Sudden tightening of neck muscles that you cannot control (muscle spasms).  Pain in the shoulders or upper back.  Limited ability to move the neck.  Headache.  Dizziness.  Nausea.  Vomiting.  Weakness, numbness, or tingling in a hand or an arm.  Symptoms may develop right away after injury, or they may develop over a few days. In some cases, symptoms may go away with treatment and return (recur) over time. How is this diagnosed? This condition may be diagnosed based on:  Your medical history.  Your symptoms.  Any recent injuries or known neck problems that you have, such as arthritis in the neck.  A physical exam.  Imaging tests, such as: ? X-rays. ? MRI. ? CT scan.  How is this treated? This condition is treated by resting and icing the injured area and doing physical therapy exercises. Depending on the severity of your condition, treatment may also include:  Keeping your neck in place (immobilized) for periods of time. This may be done using: ? A cervical collar. This supports your chin and the back of your head. ? A cervical traction device.  This is a sling that holds up your head. This removes weight and pressure from your neck, and it may help to relieve pain.  Medicines that help to relieve pain and inflammation.  Medicines that help to relax your muscles (muscle relaxants).  Surgery. This is rare.  Follow these instructions at home: If you have a cervical collar:  Wear it as told by your health care provider. Do not remove the collar unless instructed by your health care provider.  Ask your health care provider before you make  any adjustments to your collar.  If you have long hair, keep it outside of the collar.  Ask your health care provider if you can remove the collar for cleaning and bathing. If you are allowed to remove the collar for cleaning or bathing: ? Follow instructions from your health care provider about how to remove the collar safely. ? Clean the collar by wiping it with mild soap and water and drying it completely. ? If your collar has removable pads, remove them every 1-2 days and wash them by hand with soap and water. Let them air-dry completely before you put them back in the collar. ? Check your skin under the collar for irritation or sores. If you see any, tell your health care provider. Managing pain, stiffness, and swelling  If directed, use a cervical traction device as told by your health care provider.  If directed, apply heat to the affected area before you do your physical therapy or as often as told by your health care provider. Use the heat source that your health care provider recommends, such as a moist heat pack or a heating pad. ? Place a towel between your skin and the heat source. ? Leave the heat on for 20-30 minutes. ? Remove the heat if your skin turns bright red. This is especially important if you are unable to feel pain, heat, or cold. You may have a greater risk of getting burned.  If directed, put ice on the affected area: ? Put ice in a plastic bag. ? Place a towel between your skin and the bag. ? Leave the ice on for 20 minutes, 2-3 times a day. Activity  Do not drive while wearing a cervical collar. If you do not have a cervical collar, ask your health care provider if it is safe to drive while your neck heals.  Do not drive or use heavy machinery while taking prescription pain medicine or muscle relaxants, unless your health care provider approves.  Do not lift anything that is heavier than 10 lb (4.5 kg) until your health care provider tells you that it is  safe.  Rest as directed by your health care provider. Avoid positions and activities that make your symptoms worse. Ask your health care provider what activities are safe for you.  If physical therapy was prescribed, do exercises as told by your health care provider or physical therapist. General instructions  Take over-the-counter and prescription medicines only as told by your health care provider.  Do not use any products that contain nicotine or tobacco, such as cigarettes and e-cigarettes. These can delay healing. If you need help quitting, ask your health care provider.  Keep all follow-up visits as told by your health care provider or physical therapist. This is important. How is this prevented? To prevent a cervical sprain from happening again:  Use and maintain good posture. Make any needed adjustments to your workstation to help you use good posture.  Exercise   regularly as directed by your health care provider or physical therapist.  Avoid risky activities that may cause a cervical sprain.  Contact a health care provider if:  You have symptoms that get worse or do not get better after 2 weeks of treatment.  You have pain that gets worse or does not get better with medicine.  You develop new, unexplained symptoms.  You have sores or irritated skin on your neck from wearing your cervical collar. Get help right away if:  You have severe pain.  You develop numbness, tingling, or weakness in any part of your body.  You cannot move a part of your body (you have paralysis).  You have neck pain along with: ? Severe dizziness. ? Headache. Summary  A cervical sprain is a stretch or tear in one or more of the tough, cord-like tissues that connect bones (ligaments) in the neck.  Cervical sprains may be caused by an injury (trauma), such as from a motor vehicle accident, a fall, or sudden forward and backward whipping movement of the head and neck (whiplash  injury).  Symptoms may develop right away after injury, or they may develop over a few days.  This condition is treated by resting and icing the injured area and doing physical therapy exercises. This information is not intended to replace advice given to you by your health care provider. Make sure you discuss any questions you have with your health care provider. Document Released: 01/10/2007 Document Revised: 11/12/2015 Document Reviewed: 11/12/2015 Elsevier Interactive Patient Education  2017 [employer]   Cervical Strain and Sprain Rehab Ask your health care provider which exercises are safe for you. Do exercises exactly as told by your health care provider and adjust them as directed. It is normal to feel mild stretching, pulling, tightness, or discomfort as you do these exercises, but you should stop right away if you feel sudden pain or your pain gets worse.Do not begin these exercises until told by your health care provider. Stretching and range of motion exercises These exercises warm up your muscles and joints and improve the movement and flexibility of your neck. These exercises also help to relieve pain, numbness, and tingling. Exercise A: Cervical side bend  1. Using good posture, sit on a stable chair or stand up. 2. Without moving your shoulders, slowly tilt your left / right ear to your shoulder until you feel a stretch in your neck muscles. You should be looking straight ahead. 3. Hold for __________ seconds. 4. Repeat with the other side of your neck. Repeat __________ times. Complete this exercise __________ times a day. Exercise B: Cervical rotation  1. Using good posture, sit on a stable chair or stand up. 2. Slowly turn your head to the side as if you are looking over your left / right shoulder. ? Keep your eyes level with the ground. ? Stop when you feel a stretch along the side and the back of your neck. 3. Hold for __________ seconds. 4. Repeat this by turning  to your other side. Repeat __________ times. Complete this exercise __________ times a day. Exercise C: Thoracic extension and pectoral stretch 1. Roll a towel or a small blanket so it is about 4 inches (10 cm) in diameter. 2. Lie down on your back on a firm surface. 3. Put the towel lengthwise, under your spine in the middle of your back. It should not be not under your shoulder blades. The towel should line up with your spine from your  middle back to your lower back. 4. Put your hands behind your head and let your elbows fall out to your sides. 5. Hold for __________ seconds. Repeat __________ times. Complete this exercise __________ times a day. Strengthening exercises These exercises build strength and endurance in your neck. Endurance is the ability to use your muscles for a long time, even after your muscles get tired. Exercise D: Upper cervical flexion, isometric 1. Lie on your back with a thin pillow behind your head and a small rolled-up towel under your neck. 2. Gently tuck your chin toward your chest and nod your head down to look toward your feet. Do not lift your head off the pillow. 3. Hold for __________ seconds. 4. Release the tension slowly. Relax your neck muscles completely before you repeat this exercise. Repeat __________ times. Complete this exercise __________ times a day. Exercise E: Cervical extension, isometric  1. Stand about 6 inches (15 cm) away from a wall, with your back facing the wall. 2. Place a soft object, about 6-8 inches (15-20 cm) in diameter, between the back of your head and the wall. A soft object could be a small pillow, a ball, or a folded towel. 3. Gently tilt your head back and press into the soft object. Keep your jaw and forehead relaxed. 4. Hold for __________ seconds. 5. Release the tension slowly. Relax your neck muscles completely before you repeat this exercise. Repeat __________ times. Complete this exercise __________ times a  day. Posture and body mechanics  Body mechanics refers to the movements and positions of your body while you do your daily activities. Posture is part of body mechanics. Good posture and healthy body mechanics can help to relieve stress in your body's tissues and joints. Good posture means that your spine is in its natural S-curve position (your spine is neutral), your shoulders are pulled back slightly, and your head is not tipped forward. The following are general guidelines for applying improved posture and body mechanics to your everyday activities. Standing  When standing, keep your spine neutral and keep your feet about hip-width apart. Keep a slight bend in your knees. Your ears, shoulders, and hips should line up.  When you do a task in which you stand in one place for a long time, place one foot up on a stable object that is 2-4 inches (5-10 cm) high, such as a footstool. This helps keep your spine neutral. Sitting   When sitting, keep your spine neutral and your keep feet flat on the floor. Use a footrest, if necessary, and keep your thighs parallel to the floor. Avoid rounding your shoulders, and avoid tilting your head forward.  When working at a desk or a computer, keep your desk at a height where your hands are slightly lower than your elbows. Slide your chair under your desk so you are close enough to maintain good posture.  When working at a computer, place your monitor at a height where you are looking straight ahead and you do not have to tilt your head forward or downward to look at the screen. Resting When lying down and resting, avoid positions that are most painful for you. Try to support your neck in a neutral position. You can use a contour pillow or a small rolled-up towel. Your pillow should support your neck but not push on it. This information is not intended to replace advice given to you by your health care provider. Make sure you discuss any questions you have  with  your health care provider. Document Released: 03/15/2005 Document Revised: 11/20/2015 Document Reviewed: 02/19/2015 Elsevier Interactive Patient Education  2018 [employer].   Piriformis Syndrome Rehab Ask your health care provider which exercises are safe for you. Do exercises exactly as told by your health care provider and adjust them as directed. It is normal to feel mild stretching, pulling, tightness, or discomfort as you do these exercises, but you should stop right away if you feel sudden pain or your pain gets worse.Do not begin these exercises until told by your health care provider. Stretching and range of motion exercises These exercises warm up your muscles and joints and improve the movement and flexibility of your hip and pelvis. These exercises also help to relieve pain, numbness, and tingling. Exercise A: Hip rotators  1. Lie on your back on a firm surface. 2. Pull your left / right knee toward your same shoulder with your left / right hand until your knee is pointing toward the ceiling. Hold your left / right ankle with your other hand. 3. Keeping your knee steady, gently pull your left / right ankle toward your other shoulder until you feel a stretch in your buttocks. 4. Hold this position for __________ seconds. Repeat __________ times. Complete this stretch __________ times a day. Exercise B: Hip extensors 1. Lie on your back on a firm surface. Both of your legs should be straight. 2. Pull your left / right knee to your chest. Hold your leg in this position by holding onto the back of your thigh or the front of your knee. 3. Hold this position for __________ seconds. 4. Slowly return to the starting position. Repeat __________ times. Complete this stretch __________ times a day. Strengthening exercises These exercises build strength and endurance in your hip and thigh muscles. Endurance is the ability to use your muscles for a long time, even after they get  tired. Exercise C: Straight leg raises ( hip abductors) 1. Lie on your side with your left / right leg in the top position. Lie so your head, shoulder, knee, and hip line up. Bend your bottom knee to help you balance. 2. Lift your top leg up 4-6 inches (10-15 cm), keeping your toes pointed straight ahead. 3. Hold this position for __________ seconds. 4. Slowly lower your leg to the starting position. Let your muscles relax completely. Repeat __________ times. Complete this exercise__________ times a day. Exercise D: Hip abductors and rotators, quadruped  1. Get on your hands and knees on a firm, lightly padded surface. Your hands should be directly below your shoulders, and your knees should be directly below your hips. 2. Lift your left / right knee out to the side. Keep your knee bent. Do not twist your body. 3. Hold this position for __________ seconds. 4. Slowly lower your leg. Repeat __________ times. Complete this exercise__________ times a day. Exercise E: Straight leg raises ( hip extensors) 1. Lie on your abdomen on a bed or a firm surface with a pillow under your hips. 2. Squeeze your buttock muscles and lift your left / right thigh off the bed. Do not let your back arch. 3. Hold this position for __________ seconds. 4. Slowly return to the starting position. Let your muscles relax completely before doing another repetition. Repeat __________ times. Complete this exercise__________ times a day. This information is not intended to replace advice given to you by your health care provider. Make sure you discuss any questions you have with your health  care provider. Document Released: 03/15/2005 Document Revised: 11/18/2015 Document Reviewed: 02/25/2015 Elsevier Interactive Patient Education  [employer].

## 2017-08-01 ENCOUNTER — Telehealth: Payer: Self-pay

## 2017-08-01 NOTE — Telephone Encounter (Signed)
Husband reports TRAP muscle swollen. Please advise?  Per  Provider will refer to [facility].  Pt's wife has been informed of referral request. Voiced understanding & hung up.

## 2017-08-09 NOTE — Progress Notes (Signed)
FOLLOW UP  Assessment and Plan:   Hypertension Well controlled with current medications  Monitor blood pressure at home; patient to call if consistently greater than 130/80 Continue DASH diet.   Reminder to go to the ER if any CP, SOB, nausea, dizziness, severe HA, changes vision/speech, left arm numbness and tingling and jaw pain.  Cholesterol Currently at LDL goal; triglycerides remain elevated despite fenofibrate - diet discussed at length Continue low cholesterol diet and exercise.  Check lipid panel.   Prediabetes Continue medication: pending A1C will cut back to taper off metformin Continue diet and exercise.  Perform daily foot/skin check, notify office of any concerning changes.  Check A1C  Overeight Long discussion about weight loss, diet, and exercise Recommended diet heavy in fruits and veggies and low in animal meats, cheeses, and dairy products, appropriate calorie intake Discussed ideal weight for height  Will follow up in 3 months  Vitamin D Def Below goal at last check; discussed goal of 70-100 Check Vit D level  GERD Well managed on current medications Discussed diet, avoiding triggers and other lifestyle changes  Tobacco use Discussed risks associated with tobacco use and advised to reduce or quit Patient is not ready to do so, but advised to consider strongly Will follow up at the next visit  Palpitations, nausea, dizziness, diaphoresis EKG/exam normal today Check CBC, TSH, electrolytes Avoid caffeine, stop smoking, continue ASA Discussed holter monitor but declines at this time ER precautions given   Continue diet and meds as discussed. Further disposition pending results of labs. Discussed med's effects and SE's.   Over 30 minutes of exam, counseling, chart review, and critical decision making was performed.   Future Appointments  Date Time Provider Department Center  02/09/2018  9:00 AM Quentin Mulling, PA-C [facility] None     ----------------------------------------------------------------------------------------------------------------------  HPI 62 y.o. male  presents for 3 month follow up on hypertension, cholesterol, glucose management, weight and vitamin D deficiency. He presents today reporting several episodes of sensation of palpitations, skipping heart beats and dizziness with nausea and diaphoresis which lasted 5-10 mins x 3-4 episodes between 8 and 10 am today. He denies previous hx of this. This AM he had normal 1 cup of coffee. He does drink a 6 pack on the weekend, occasional marijuana (2-3 joints over the weekend), no cocaine use, doesn't typically drink during the week. Stress levels are normal. Not currently seeing cardiologist, has had normal ECHO in 2011. Denies hx of palpitations.   he currently continues to smoke 0.5-0.75 pack/day; discussed risks associated with smoking, patient is not ready to quit, but is thinking about restarting with nicotine gum soon.  he has a diagnosis of GERD which is currently managed by protonix 40 mg he reports symptoms is currently well controlled, and denies breakthrough reflux, burning in chest, hoarseness or cough.    BMI is Body mass index is 31.74 kg/m., he has not been working on diet and exercise. Wt Readings from Last 3 Encounters:  08/10/17 234 lb (106.1 kg)  07/25/17 237 lb 9.6 oz (107.8 kg)  02/08/17 233 lb (105.7 kg)   His blood pressure has been controlled at home, today their BP is    He does not workout. He denies chest pain, shortness of breath, dizziness other than episode this morning.   He is on cholesterol medication (fenofibrate and denies myalgias. His cholesterol is not at goal. The cholesterol last visit was:   Lab Results  Component Value Date   CHOL 151 02/08/2017  HDL 24 (L) 02/08/2017   LDLCALC 91 02/08/2017   TRIG 275 (H) 02/08/2017   CHOLHDL 6.3 (H) 02/08/2017    He has not been working on diet and exercise for prediabetes  (on metformin 2 tabs once daily), and denies foot ulcerations, increased appetite, nausea, paresthesia of the feet, polydipsia, polyuria, visual disturbances, vomiting and weight loss. Last A1C in the office was:  Lab Results  Component Value Date   HGBA1C 5.2 10/07/2016   Patient is not currently on Vitamin D supplement and is below goal  Lab Results  Component Value Date   VD25OH 20 (L) 02/08/2017       Current Medications:  Current Outpatient Medications on File Prior to Visit  Medication Sig  . baclofen (LIORESAL) 10 MG tablet Take 1 tablet (10 mg total) by mouth 2 (two) times daily. Take 1/2 to 1 tablet 2 x day if needed for muscle spasm (Patient taking differently: Take 10 mg by mouth 2 (two) times daily. As needed)  . fenofibrate micronized (LOFIBRA) 134 MG capsule TAKE 1 CAPSULE BY MOUTH BEFORE BREAKFAST  . glucose blood (FREESTYLE LITE) test strip DX-E11.9  Check blood sugar 1 time a day  . Lancets (FREESTYLE) lancets Use as instructed test sugars 1 daily Dx code:E11.9  . meloxicam (MOBIC) 15 MG tablet TAKE 1 TABLET BY MOUTH EVERY DAY WITH FOOD FOR PAIN (Patient taking differently: As needed)  . metFORMIN (GLUCOPHAGE-XR) 500 MG 24 hr tablet TAKE 1 TO 2 TABLETS TWICE A DAY FOR DIABETES  . pantoprazole (PROTONIX) 40 MG tablet Take 1 tablet (40 mg total) daily by mouth.  . triamcinolone cream (KENALOG) 0.1 % Apply 1 application topically 3 (three) times daily.   No current facility-administered medications on file prior to visit.      Allergies:  Allergies  Allergen Reactions  . Chantix [Varenicline]     Nausea  . Lopid [Gemfibrozil]     Nausea/weakness     Medical History:  Past Medical History:  Diagnosis Date  . Chest pain 05/16/2012  . Colon polyps   . DDD (degenerative disc disease)   . Diabetes mellitus without complication (HCC)   . Hepatitis C   . Hiatal hernia   . Hypercholesteremia   . Marijuana use   . Osteoarthritis   . Sarcoid   . Thrombocytopenia  (HCC)   . Tobacco abuse    Family history- Reviewed and unchanged Social history- Reviewed and unchanged   Review of Systems:  Review of Systems  Constitutional: Negative for malaise/fatigue and weight loss.  HENT: Negative for hearing loss and tinnitus.   Eyes: Negative for blurred vision and double vision.  Respiratory: Negative for cough, shortness of breath and wheezing.   Cardiovascular: Negative for chest pain, palpitations, orthopnea, claudication and leg swelling.  Gastrointestinal: Negative for abdominal pain, blood in stool, constipation, diarrhea, heartburn, melena, nausea and vomiting.  Genitourinary: Negative.   Musculoskeletal: Negative for joint pain and myalgias.  Skin: Negative for rash.  Neurological: Negative for dizziness, tingling, sensory change, weakness and headaches.  Endo/Heme/Allergies: Negative for polydipsia.  Psychiatric/Behavioral: Negative.   All other systems reviewed and are negative.     Physical Exam: Pulse 96   Temp (!) 97.5 F (36.4 C)   Ht 6' (1.829 m)   Wt 234 lb (106.1 kg)   SpO2 95%   BMI 31.74 kg/m  Wt Readings from Last 3 Encounters:  08/10/17 234 lb (106.1 kg)  07/25/17 237 lb 9.6 oz (107.8 kg)  02/08/17 233  lb (105.7 kg)   General Appearance: Well nourished, in no apparent distress. Eyes: PERRLA, EOMs, conjunctiva no swelling or erythema Sinuses: No Frontal/maxillary tenderness ENT/Mouth: Ext aud canals clear, TMs without erythema, bulging. No erythema, swelling, or exudate on post pharynx.  Tonsils not swollen or erythematous. Hearing normal.  Neck: Supple, thyroid normal.  Respiratory: Respiratory effort normal, BS equal bilaterally without rales, rhonchi, wheezing or stridor.  Cardio: RRR with no MRGs. Brisk peripheral pulses without edema.  Abdomen: Soft, + BS.  Non tender, no guarding, rebound, hernias, masses. Lymphatics: Non tender without lymphadenopathy.  Musculoskeletal: Full ROM, 5/5 strength, Normal gait Skin:  Warm, dry without rashes, lesions, ecchymosis.  Neuro: Cranial nerves intact. No cerebellar symptoms.  Psych: Awake and oriented X 3, normal affect, Insight and Judgment appropriate.    Dan Maker, NP 4:29 PM [facility]

## 2017-08-10 ENCOUNTER — Encounter: Payer: Self-pay | Admitting: Adult Health

## 2017-08-10 ENCOUNTER — Ambulatory Visit: Payer: Self-pay | Admitting: Physician Assistant

## 2017-08-10 ENCOUNTER — Ambulatory Visit (INDEPENDENT_AMBULATORY_CARE_PROVIDER_SITE_OTHER): Payer: 59 | Admitting: Adult Health

## 2017-08-10 VITALS — BP 122/78 | HR 96 | Temp 97.5°F | Ht 72.0 in | Wt 234.0 lb

## 2017-08-10 DIAGNOSIS — D696 Thrombocytopenia, unspecified: Secondary | ICD-10-CM | POA: Diagnosis not present

## 2017-08-10 DIAGNOSIS — R002 Palpitations: Secondary | ICD-10-CM | POA: Diagnosis not present

## 2017-08-10 DIAGNOSIS — R61 Generalized hyperhidrosis: Secondary | ICD-10-CM | POA: Diagnosis not present

## 2017-08-10 DIAGNOSIS — E559 Vitamin D deficiency, unspecified: Secondary | ICD-10-CM | POA: Diagnosis not present

## 2017-08-10 DIAGNOSIS — Z720 Tobacco use: Secondary | ICD-10-CM | POA: Diagnosis not present

## 2017-08-10 DIAGNOSIS — R42 Dizziness and giddiness: Secondary | ICD-10-CM

## 2017-08-10 DIAGNOSIS — R7303 Prediabetes: Secondary | ICD-10-CM | POA: Diagnosis not present

## 2017-08-10 DIAGNOSIS — K219 Gastro-esophageal reflux disease without esophagitis: Secondary | ICD-10-CM

## 2017-08-10 DIAGNOSIS — E663 Overweight: Secondary | ICD-10-CM | POA: Diagnosis not present

## 2017-08-10 DIAGNOSIS — E785 Hyperlipidemia, unspecified: Secondary | ICD-10-CM

## 2017-08-10 DIAGNOSIS — I10 Essential (primary) hypertension: Secondary | ICD-10-CM | POA: Diagnosis not present

## 2017-08-10 DIAGNOSIS — Z79899 Other long term (current) drug therapy: Secondary | ICD-10-CM | POA: Diagnosis not present

## 2017-08-10 NOTE — Patient Instructions (Signed)
Try bearing down if you have another episode, go to ER if having palpitations with nausea/dizziness/swelling/chest pain/shortness of breath.  Can call office if just having palpitation episodes and we can order the holter monitor that we discussed today.    Palpitations A palpitation is the feeling that your heartbeat is irregular or is faster than normal. It may feel like your heart is fluttering or skipping a beat. Palpitations are usually not a serious problem. They may be caused by many things, including smoking, caffeine, alcohol, stress, and certain medicines. Although most causes of palpitations are not serious, palpitations can be a sign of a serious medical problem. In some cases, you may need further medical evaluation. Follow these instructions at home: Pay attention to any changes in your symptoms. Take these actions to help with your condition:  Avoid the following: ? Caffeinated coffee, tea, soft drinks, diet pills, and energy drinks. ? Chocolate. ? Alcohol.  Do not use any tobacco products, such as cigarettes, chewing tobacco, and e-cigarettes. If you need help quitting, ask your health care provider.  Try to reduce your stress and anxiety. Things that can help you relax include: ? Yoga. ? Meditation. ? Physical activity, such as swimming, jogging, or walking. ? Biofeedback. This is a method that helps you learn to use your mind to control things in your body, such as your heartbeats.  Get plenty of rest and sleep.  Take over-the-counter and prescription medicines only as told by your health care provider.  Keep all follow-up visits as told by your health care provider. This is important.  Contact a health care provider if:  You continue to have a fast or irregular heartbeat after 24 hours.  Your palpitations occur more often. Get help right away if:  You have chest pain or shortness of breath.  You have a severe headache.  You feel dizzy or you faint. This  information is not intended to replace advice given to you by your health care provider. Make sure you discuss any questions you have with your health care provider. Document Released: 03/12/2000 Document Revised: 08/18/2015 Document Reviewed: 11/28/2014 Elsevier Interactive Patient Education  [employer].

## 2017-08-11 LAB — COMPLETE METABOLIC PANEL WITH GFR
AG RATIO: 1.7 (calc) (ref 1.0–2.5)
ALKALINE PHOSPHATASE (APISO): 76 U/L (ref 40–115)
ALT: 22 U/L (ref 9–46)
AST: 18 U/L (ref 10–35)
Albumin: 4.5 g/dL (ref 3.6–5.1)
BILIRUBIN TOTAL: 0.5 mg/dL (ref 0.2–1.2)
BUN: 15 mg/dL (ref 7–25)
CHLORIDE: 107 mmol/L (ref 98–110)
CO2: 25 mmol/L (ref 20–32)
Calcium: 9.8 mg/dL (ref 8.6–10.3)
Creat: 1.22 mg/dL (ref 0.70–1.33)
GFR, EST AFRICAN AMERICAN: 75 mL/min/{1.73_m2} (ref >=60)
GFR, Est Non African American: 64 mL/min/{1.73_m2} (ref >=60)
Globulin: 2.6 g/dL (calc) (ref 1.9–3.7)
Glucose, Bld: 101 mg/dL — ABNORMAL HIGH (ref 65–99)
POTASSIUM: 4.2 mmol/L (ref 3.5–5.3)
Sodium: 140 mmol/L (ref 135–146)
Total Protein: 7.1 g/dL (ref 6.1–8.1)

## 2017-08-11 LAB — CBC WITH DIFFERENTIAL/PLATELET
Basophils Absolute: 77 cells/uL (ref 0–200)
Basophils Relative: 0.7 %
EOS ABS: 187 {cells}/uL (ref 15–500)
Eosinophils Relative: 1.7 %
HCT: 44.7 % (ref 38.5–50.0)
Hemoglobin: 15.4 g/dL (ref 13.2–17.1)
Lymphs Abs: 2024 cells/uL (ref 850–3900)
MCH: 28.9 pg (ref 27.0–33.0)
MCHC: 34.5 g/dL (ref 32.0–36.0)
MCV: 83.9 fL (ref 80.0–100.0)
MPV: 10.6 fL (ref 7.5–12.5)
Monocytes Relative: 6.7 %
NEUTROS PCT: 72.5 %
Neutro Abs: 7975 cells/uL — ABNORMAL HIGH (ref 1500–7800)
Platelets: 244 10*3/uL (ref 140–400)
RBC: 5.33 10*6/uL (ref 4.20–5.80)
RDW: 12.5 % (ref 11.0–15.0)
TOTAL LYMPHOCYTE: 18.4 %
WBC: 11 10*3/uL — AB (ref 3.8–10.8)
WBCMIX: 737 {cells}/uL (ref 200–950)

## 2017-08-11 LAB — VITAMIN D 25 HYDROXY (VIT D DEFICIENCY, FRACTURES): Vit D, 25-Hydroxy: 16 ng/mL — ABNORMAL LOW (ref 30–100)

## 2017-08-11 LAB — HEMOGLOBIN A1C
Hgb A1c MFr Bld: 5.9 % of total Hgb — ABNORMAL HIGH (ref <5.7)
Mean Plasma Glucose: 123 (calc)
eAG (mmol/L): 6.8 (calc)

## 2017-08-11 LAB — LIPID PANEL
CHOLESTEROL: 195 mg/dL (ref <200)
HDL: 30 mg/dL — AB (ref >40)
Non-HDL Cholesterol (Calc): 165 mg/dL (calc) — ABNORMAL HIGH (ref <130)
TRIGLYCERIDES: 403 mg/dL — AB (ref <150)
Total CHOL/HDL Ratio: 6.5 (calc) — ABNORMAL HIGH (ref <5.0)

## 2017-08-11 LAB — MAGNESIUM: Magnesium: 2 mg/dL (ref 1.5–2.5)

## 2017-08-11 LAB — TSH: TSH: 1.03 m[IU]/L (ref 0.40–4.50)

## 2017-11-20 ENCOUNTER — Other Ambulatory Visit: Payer: Self-pay | Admitting: Physician Assistant

## 2017-11-20 DIAGNOSIS — E785 Hyperlipidemia, unspecified: Secondary | ICD-10-CM

## 2017-11-20 DIAGNOSIS — Z0001 Encounter for general adult medical examination with abnormal findings: Secondary | ICD-10-CM

## 2017-12-22 ENCOUNTER — Encounter: Payer: Self-pay | Admitting: Adult Health

## 2017-12-22 ENCOUNTER — Ambulatory Visit (INDEPENDENT_AMBULATORY_CARE_PROVIDER_SITE_OTHER): Payer: 59 | Admitting: Adult Health

## 2017-12-22 VITALS — BP 136/64 | HR 82 | Temp 97.5°F | Ht 72.0 in | Wt 235.0 lb

## 2017-12-22 DIAGNOSIS — M7581 Other shoulder lesions, right shoulder: Secondary | ICD-10-CM | POA: Diagnosis not present

## 2017-12-22 DIAGNOSIS — M778 Other enthesopathies, not elsewhere classified: Secondary | ICD-10-CM

## 2017-12-22 MED ORDER — PREDNISONE 20 MG PO TABS: ORAL_TABLET | ORAL | 0 refills | Status: DC

## 2017-12-22 NOTE — Patient Instructions (Signed)
Take mobic daily x 14 days and longer as needed  Take full prednisone taper  Apply ice after work shift to shoulder  Call if not improving   Avoid aggravating movements as best as possible     Shoulder Pain Many things can cause shoulder pain, including:  An injury to the area.  Overuse of the shoulder.  Arthritis.  The source of the pain can be:  Inflammation.  An injury to the shoulder joint.  An injury to a tendon, ligament, or bone.  Follow these instructions at home: Take these actions to help with your pain:  Squeeze a soft ball or a foam pad as much as possible. This helps to keep the shoulder from swelling. It also helps to strengthen the arm.  Take over-the-counter and prescription medicines only as told by your health care provider.  If directed, apply ice to the area: ? Put ice in a plastic bag. ? Place a towel between your skin and the bag. ? Leave the ice on for 20 minutes, 2-3 times per day. Stop applying ice if it does not help with the pain.  If you were given a shoulder sling or immobilizer: ? Wear it as told. ? Remove it to shower or bathe. ? Move your arm as little as possible, but keep your hand moving to prevent swelling.  Contact a health care provider if:  Your pain gets worse.  Your pain is not relieved with medicines.  New pain develops in your arm, hand, or fingers. Get help right away if:  Your arm, hand, or fingers: ? Tingle. ? Become numb. ? Become swollen. ? Become painful. ? Turn white or blue. This information is not intended to replace advice given to you by your health care provider. Make sure you discuss any questions you have with your health care provider. Document Released: 12/23/2004 Document Revised: 11/09/2015 Document Reviewed: 07/08/2014 Elsevier Interactive Patient Education  [employer].

## 2017-12-22 NOTE — Progress Notes (Signed)
Assessment and Plan:  Jerry Stewart was seen today for shoulder pain.  Diagnoses and all orders for this visit:  Shoulder tendinitis, right Right shoulder- Natural history and expected course discussed. Questions answered. Agricultural engineer distributed. Reduction in offending activity - patient works and full rest is not currently an option  Rest, ice, compression, and elevation (RICE) therapy. NSAIDs per medication orders - take mobic daily x 2 weeks then PRN prednisone taper as prescribed -     predniSONE (DELTASONE) 20 MG tablet; 2 tablets daily for 3 days, 1 tablet daily for 4 days.  Further disposition pending results of labs. Discussed med's effects and SE's.   Over 15 minutes of exam, counseling, chart review, and critical decision making was performed.   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 Reports in the last few days he has had some pain when reaching above his head, indicates general anterior pain, sharp/quick pain, 6-7/10 with lifting overhead motion, doesn't have consistently, non-radiating will have some after 30-45 min of work. He is prescribed mobic 15 mg intermittently for back pain and recalls today that when he takes this he does not have pain, just vague discomfort.   Past Medical History:  Diagnosis Date  . Chest pain 05/16/2012  . Colon polyps   . DDD (degenerative disc disease)   . Diabetes mellitus without complication (HCC)   . Hepatitis C   . Hiatal hernia   .  Hypercholesteremia   . Marijuana use   . Osteoarthritis   . Sarcoid   . Thrombocytopenia (HCC)   . Tobacco abuse      Allergies  Allergen Reactions  . Chantix [Varenicline]     Nausea  . Lopid [Gemfibrozil]     Nausea/weakness    Current Outpatient Medications on File Prior to Visit  Medication Sig  . fenofibrate micronized (LOFIBRA) 134 MG capsule TAKE 1 CAPSULE BY MOUTH BEFORE BREAKFAST  . glucose blood (FREESTYLE LITE) test strip DX-E11.9  Check blood sugar 1 time a day  . Lancets (FREESTYLE) lancets Use as instructed test sugars 1 daily Dx code:E11.9  . meloxicam (MOBIC) 15 MG tablet TAKE 1 TABLET BY MOUTH EVERY DAY WITH FOOD FOR PAIN (Patient taking differently: No sig reported)  . metFORMIN (GLUCOPHAGE-XR) 500 MG 24 hr tablet TAKE 1 TO 2 TABLETS TWICE A DAY FOR DIABETES  . baclofen (LIORESAL) 10 MG tablet Take 1 tablet (10 mg total) by mouth 2 (two) times daily. Take 1/2 to 1 tablet 2 x day if needed for muscle spasm (Patient not taking: Reported on 12/22/2017)  . pantoprazole (PROTONIX) 40 MG tablet Take 1 tablet (40 mg total) daily by mouth. (Patient not taking: Reported on 12/22/2017)  . triamcinolone cream (KENALOG) 0.1 % Apply 1 application topically 3 (three) times daily. (Patient not taking: Reported on 12/22/2017)   No current facility-administered medications on file prior to visit.     ROS: all negative except above.   Physical Exam:  BP 136/64   Pulse 82   Temp [name])  97.5 F (36.4 C)   Ht 6' (1.829 m)   Wt 235 lb (106.6 kg)   SpO2 97%   BMI 31.87 kg/m   General Appearance: Well nourished, in no apparent distress. Eyes: conjunctiva no swelling or erythema ENT/Mouth: Hearing normal.  Neck: Supple  Respiratory: Respiratory effort normal Cardio: RRR with no MRGs.  Abdomen: Soft, + BS.  Non tender Lymphatics: Non tender without lymphadenopathy.  Musculoskeletal: Full ROM, 5/5 strength, normal gait. R shoulder nontender, no effusion, no palpable abnormality.  Has brief pain in anterior shoulder with anterior abduction of shoulder past 120 degrees without weakness, catching.  Skin: Warm, dry without rashes, lesions, ecchymosis.  Neuro: Sensation intact.  Psych: Awake and oriented X 3, normal affect, Insight and Judgment appropriate.     Dan Maker, NP 10:13 AM [facility]

## 2018-01-11 ENCOUNTER — Other Ambulatory Visit: Payer: Self-pay | Admitting: Internal Medicine

## 2018-02-06 NOTE — Progress Notes (Signed)
Complete Physical  Assessment and Plan:  Essential hypertension - continue medications, DASH diet, exercise and monitor at home. Call if greater than 130/80.  -     FLU VACCINE MDCK QUAD W/Preservative -     CBC with Differential/Platelet -     TSH -     COMPLETE METABOLIC PANEL WITH GFR -     Microalbumin / creatinine urine ratio -     Urinalysis, Routine w reflex microscopic -     EKG 12-Lead  Hyperlipidemia, unspecified hyperlipidemia type check lipids decrease fatty foods increase activity.  - may start statin pending labs -     Lipid panel  Sarcoidosis -     DG Chest 2 View; Future -     Ambulatory referral to Pulmonology- will refer back to pulmonary -     umeclidinium-vilanterol (ANORO ELLIPTA) 62.5-25 MCG/INH AEPB; Inhale 1 puff into the lungs daily. Make sure you rinse your mouth after each use.  Thrombocytopenia (HCC) -     FLU VACCINE MDCK QUAD W/Preservative  Tobacco abuse -     EKG 12-Lead -     DG Chest 2 View; Future -     Ambulatory referral to Pulmonology -     buPROPion (WELLBUTRIN XL) 150 MG 24 hr tablet; Take 1 tablet (150 mg total) by mouth every morning.  Prediabetes Discussed disease progression and risks Discussed diet/exercise, weight management and risk modification -     Hemoglobin A1c  Overweight  Overweight  - long discussion about weight loss, diet, and exercise -recommended diet heavy in fruits and veggies and low in animal meats, cheeses, and dairy products  Medication management -     Magnesium  Recurrent major depressive disorder, in partial remission (HCC) Will start wellbutrin for smoking cessation as well Follow up 3 months  Hiatal hernia Continue PPI/H2 blocker, diet discussed  Hepatitis C virus infection without hepatic coma, unspecified chronicity Treated, following here, monitor LFTs  Polyp of colon, unspecified part of colon, unspecified type Follow up  Gastroesophageal reflux disease, esophagitis presence not  specified Continue PPI/H2 blocker, diet discussed  Vitamin D deficiency -     VITAMIN D 25 Hydroxy (Vit-D Deficiency, Fractures)  Screening PSA (prostate specific antigen) -     PSA  Screening, anemia, deficiency, iron -     Iron,Total/Total Iron Binding Cap -     Ferritin -     Methylmalonic acid, serum       Discussed med's effects and SE's. Screening labs and tests as requested with regular follow-up as recommended. Future Appointments  Date Time Provider Department Center  02/20/2019  9:00 AM Quentin Mulling, PA-C [facility] None    HPI Patient presents for a complete physical and follow up. [name]   He smokes and has sarcoidosis, followed with Dr. Shelle Iron and has seen Dr. [place] in the past but it has been years, he tried chantix but states he could not continue due to side effects. Denies SOB but states that he is not moving around as much. Will try wellbutrin.     His blood pressure has been controlled at home, today their BP is BP: 126/70 He does not workout. He denies chest pain, shortness of breath, dizziness.  He is on cholesterol medication, fenofibrate and denies myalgias. His cholesterol is not at goal. The cholesterol last visit was:   Lab Results  Component Value Date   CHOL 195 08/10/2017   HDL 30 (L) 08/10/2017   LDLCALC  08/10/2017  Comment:     . LDL cholesterol not calculated. Triglyceride levels greater than 400 mg/dL invalidate calculated LDL results. . Reference range: <100 . Desirable range <100 mg/dL for primary prevention;   <70 mg/dL for patients with CHD or diabetic patients  with > or = 2 CHD risk factors. [name] LDL-C is now calculated using the Martin-Hopkins  calculation, which is a validated novel method providing  better accuracy than the Friedewald equation in the  estimation of LDL-C.  Horald Pollen et al. Lenox Ahr. 9604;540(98): 2061-2068  (http://education.[facility].com/faq/FAQ164)    TRIG 403 (H) 08/10/2017   CHOLHDL 6.5 (H)  08/10/2017    He has been working on diet and exercise for prediabetes, he is on metformin, and denies paresthesia of the feet, polydipsia and polyuria. Last A1C in the office was:  Lab Results  Component Value Date   HGBA1C 5.9 (H) 08/10/2017   Patient is on Vitamin D supplement.   Lab Results  Component Value Date   VD25OH 16 (L) 08/10/2017     Last PSA is  Lab Results  Component Value Date   PSA 1.5 02/08/2017   PSA 1.1 02/09/2016   PSA 1.20 02/07/2015   He did have low thimine and has been on B complex.  He has a history of hepatitis C, followed with Dr. Lucas Mallow, treated on 04/22/2014, Harvoni, and he has decreased his beer drinking, has been drinking Odoul's. Has had Hep B vaccine, no longer needs to follow up, will follow up here.   BMI is Body mass index is 31.38 kg/m., he is working on diet and exercise. Wt Readings from Last 3 Encounters:  02/09/18 234 lb 9.6 oz (106.4 kg)  12/22/17 235 lb (106.6 kg)  08/10/17 234 lb (106.1 kg)     Current Medications:  Current Outpatient Medications on File Prior to Visit  Medication Sig Dispense Refill  . baclofen (LIORESAL) 10 MG tablet Take 1 tablet (10 mg total) by mouth 2 (two) times daily. Take 1/2 to 1 tablet 2 x day if needed for muscle spasm 60 tablet 1  . fenofibrate micronized (LOFIBRA) 134 MG capsule TAKE 1 CAPSULE BY MOUTH BEFORE BREAKFAST 90 capsule 1  . glucose blood (FREESTYLE LITE) test strip DX-E11.9  Check blood sugar 1 time a day 100 each 5  . Lancets (FREESTYLE) lancets Use as instructed test sugars 1 daily Dx code:E11.9 100 each 5  . meloxicam (MOBIC) 15 MG tablet TAKE 1 TABLET BY MOUTH EVERY DAY WITH FOOD FOR PAIN (Patient taking differently: No sig reported) 90 tablet 0  . metFORMIN (GLUCOPHAGE-XR) 500 MG 24 hr tablet TAKE 1 TO 2 TABLETS TWICE A DAY FOR DIABETES 360 tablet 1  . triamcinolone cream (KENALOG) 0.1 % Apply 1 application topically 3 (three) times daily. 45 g 0  . pantoprazole (PROTONIX) 40 MG tablet  Take 1 tablet (40 mg total) daily by mouth. (Patient not taking: Reported on 12/22/2017) 90 tablet 0   No current facility-administered medications on file prior to visit.    Health Maintenance:  Immunization History  Administered Date(s) Administered  . Hepatitis B 06/24/2014  . Influenza Inj Mdck Quad With Preservative 02/08/2017  . Influenza Whole 01/17/2012  . Influenza, Seasonal, Injecte, Preservative Fre 02/09/2016  . Pneumococcal Conjugate-13 12/28/2011  . Pneumococcal Polysaccharide-23 10/03/2012  . Tdap 10/04/2011    Tetanus: 09/2011 Pneumovax: 2014 Prevnar 13: 2013 Flu vaccine: TODAY Zostavax: N/A  Hep B: 2016 DEXA: Colonoscopy: 12/2013 Dr. Christella Hartigan EGD: Heart cath 04/2013  Allergies:  Allergies  Allergen Reactions  . Chantix [Varenicline]     Nausea  . Lopid [Gemfibrozil]     Nausea/weakness   Medical History:  Past Medical History:  Diagnosis Date  . Chest pain 05/16/2012  . Colon polyps   . DDD (degenerative disc disease)   . Diabetes mellitus without complication (HCC)   . Hepatitis C   . Hiatal hernia   . Hypercholesteremia   . Marijuana use   . Osteoarthritis   . Sarcoid   . Thrombocytopenia (HCC)   . Tobacco abuse    SURGICAL HISTORY He  has a past surgical history that includes Appendectomy; HEAD TRAUMA (1975); and left heart catheterization with coronary angiogram (N/A, 05/17/2012). FAMILY HISTORY His family history includes Cancer in his father; Diabetes Mellitus II in his mother; Leukemia in his mother; Other in his unknown relative. SOCIAL HISTORY He  reports that he has been smoking cigarettes. He has a 15.00 pack-year smoking history. He has never used smokeless tobacco. He reports that he drinks about 6.0 standard drinks of alcohol per week. He reports that he has current or past drug history. Drug: Marijuana. Frequency: 2.00 times per week.  Review of Systems  Constitutional: Negative.  Negative for chills, diaphoresis, fever,  malaise/fatigue and weight loss.  HENT: Negative for congestion, ear discharge, ear pain, hearing loss, nosebleeds, sore throat and tinnitus.   Eyes: Negative.   Respiratory: Negative for cough, shortness of breath and stridor.   Cardiovascular: Negative.  Negative for leg swelling.  Gastrointestinal: Negative.   Genitourinary: Negative.   Musculoskeletal: Positive for back pain and joint pain. Negative for falls, myalgias and neck pain.  Skin: Negative.   Neurological: Negative.  Negative for weakness and headaches.  Psychiatric/Behavioral: Negative.      Physical Exam: Estimated body mass index is 31.38 kg/m as calculated from the following:   Height as of this encounter: 6' 0.5" (1.842 m).   Weight as of this encounter: 234 lb 9.6 oz (106.4 kg). BP 126/70   Pulse 81   Temp (!) 97.5 F (36.4 C)   Resp 16   Ht 6' 0.5" (1.842 m)   Wt 234 lb 9.6 oz (106.4 kg)   SpO2 98%   BMI 31.38 kg/m  General Appearance: Obese, in no apparent distress.  Eyes: PERRLA, EOMs, conjunctiva no swelling or erythema, normal fundi and vessels.  Sinuses: No Frontal/maxillary tenderness  ENT/Mouth: Ext aud canals clear,  Crowded mouth, normal light reflex with TMs without erythema, bulging. Good dentition. No erythema, swelling, or exudate on post pharynx. Tonsils not swollen or erythematous. Hearing normal.  Neck: Supple, thyroid normal. No bruits  Respiratory: Respiratory effort normal, + expiratory wheezing LLL,  without rales, rhonchi, or stridor.  Cardio: RRR without murmurs, rubs or gallops. Brisk peripheral pulses without edema.  Chest: symmetric, with normal excursions and percussion.  Abdomen: Soft, nontender, obese no guarding, rebound, hernias, masses, or organomegaly. .  Lymphatics: Non tender without lymphadenopathy.  Genitourinary: defer Musculoskeletal: Full ROM all peripheral extremities,5/5 strength, and normal gait.  Skin: Warm, dry without rashes, lesions, ecchymosis. Neuro:  Cranial nerves intact, reflexes equal bilaterally. Normal muscle tone, no cerebellar symptoms. Psych: Awake and oriented X 3, normal affect, Insight and Judgment appropriate.   EKG: WNL no changes. Aorta scan defer  Quentin Mulling 9:55 AM [facility]

## 2018-02-09 ENCOUNTER — Encounter: Payer: Self-pay | Admitting: Physician Assistant

## 2018-02-09 ENCOUNTER — Ambulatory Visit (INDEPENDENT_AMBULATORY_CARE_PROVIDER_SITE_OTHER): Payer: 59 | Admitting: Physician Assistant

## 2018-02-09 VITALS — BP 126/70 | HR 81 | Temp 97.5°F | Resp 16 | Ht 72.5 in | Wt 234.6 lb

## 2018-02-09 DIAGNOSIS — Z130 Encounter for screening for diseases of the blood and blood-forming organs and certain disorders involving the immune mechanism: Secondary | ICD-10-CM

## 2018-02-09 DIAGNOSIS — Z136 Encounter for screening for cardiovascular disorders: Secondary | ICD-10-CM | POA: Diagnosis not present

## 2018-02-09 DIAGNOSIS — E559 Vitamin D deficiency, unspecified: Secondary | ICD-10-CM

## 2018-02-09 DIAGNOSIS — Z79899 Other long term (current) drug therapy: Secondary | ICD-10-CM

## 2018-02-09 DIAGNOSIS — B1920 Unspecified viral hepatitis C without hepatic coma: Secondary | ICD-10-CM

## 2018-02-09 DIAGNOSIS — Z23 Encounter for immunization: Secondary | ICD-10-CM

## 2018-02-09 DIAGNOSIS — K219 Gastro-esophageal reflux disease without esophagitis: Secondary | ICD-10-CM

## 2018-02-09 DIAGNOSIS — K449 Diaphragmatic hernia without obstruction or gangrene: Secondary | ICD-10-CM

## 2018-02-09 DIAGNOSIS — D869 Sarcoidosis, unspecified: Secondary | ICD-10-CM

## 2018-02-09 DIAGNOSIS — E785 Hyperlipidemia, unspecified: Secondary | ICD-10-CM

## 2018-02-09 DIAGNOSIS — Z720 Tobacco use: Secondary | ICD-10-CM

## 2018-02-09 DIAGNOSIS — Z0000 Encounter for general adult medical examination without abnormal findings: Secondary | ICD-10-CM | POA: Diagnosis not present

## 2018-02-09 DIAGNOSIS — D696 Thrombocytopenia, unspecified: Secondary | ICD-10-CM

## 2018-02-09 DIAGNOSIS — I10 Essential (primary) hypertension: Secondary | ICD-10-CM | POA: Diagnosis not present

## 2018-02-09 DIAGNOSIS — E663 Overweight: Secondary | ICD-10-CM

## 2018-02-09 DIAGNOSIS — K635 Polyp of colon: Secondary | ICD-10-CM

## 2018-02-09 DIAGNOSIS — R7303 Prediabetes: Secondary | ICD-10-CM

## 2018-02-09 DIAGNOSIS — F3341 Major depressive disorder, recurrent, in partial remission: Secondary | ICD-10-CM

## 2018-02-09 DIAGNOSIS — Z125 Encounter for screening for malignant neoplasm of prostate: Secondary | ICD-10-CM

## 2018-02-09 MED ORDER — UMECLIDINIUM-VILANTEROL 62.5-25 MCG/INH IN AEPB: 1.0000 | INHALATION_SPRAY | Freq: Every day | RESPIRATORY_TRACT | 0 refills | Status: DC

## 2018-02-09 MED ORDER — BUPROPION HCL ER (XL) 150 MG PO TB24: 150.0000 mg | ORAL_TABLET | ORAL | 2 refills | Status: DC

## 2018-02-09 NOTE — Patient Instructions (Addendum)
VITAMIN D IS IMPORTANT  Vitamin D goal is between 60-80  Please make sure that you are taking your Vitamin D as directed.   It is very important as a natural anti-inflammatory   helping hair, skin, and nails, as well as reducing stroke and heart attack risk.   It helps your bones and helps with mood.  We want you on at least 5000 IU daily  It also decreases numerous cancer risks so please take it as directed.   Low Vit D is associated with a 200-300% higher risk for CANCER   and 200-300% higher risk for HEART   ATTACK  &  STROKE.    .....................................[name].  It is also associated with higher death rate at younger ages,   autoimmune diseases like Rheumatoid arthritis, Lupus, Multiple Sclerosis.     Also many other serious conditions, like depression, Alzheimer's  Dementia, infertility, muscle aches, fatigue, fibromyalgia - just to name a few.  +++++++++++++++++++  Can get liquid vitamin D from [place]  OR [place] at  [facility] [address], [place], [place] [id number] Or you can try [employer]   Get on b complex   Can try the wellbutrin for mood/smoking stopping help and here is more info below Can try to call these numbers Will refer to pulmonary for followup  INFORMATION ABOUT YOUR XRAY  [facility], go to radiology and give them your name. They will have the order and take you back. You do not any paper work, I should get the result back today or tomorrow. This order is good for a year.   SMOKING CESSATION WITH CHANTIX  American cancer society  [phone] for more information or for a free program for smoking cessation help.   You can call QUIT SMART 1-800-QUIT-NOW for free nicotine patches or replacement therapy- if they are out- keep calling  [facility] Can call for smoking cessation classes, [phone]  If you have a smart phone, please look up Smoke Free app, this will help you stay  on track and give you information about money you have saved, life that you have gained back and a ton of more information.    ADVANTAGES OF QUITTING SMOKING  Within 20 minutes, blood pressure decreases. Your pulse is at normal level.  After 8 hours, carbon monoxide levels in the blood return to normal. Your oxygen level increases.  After 24 hours, the chance of having a heart attack starts to decrease. Your breath, hair, and body stop smelling like smoke.  After 48 hours, damaged nerve endings begin to recover. Your sense of taste and smell improve.  After 72 hours, the body is virtually free of nicotine. Your bronchial tubes relax and breathing becomes easier.  After 2 to 12 weeks, lungs can hold more air. Exercise becomes easier and circulation improves.  After 1 year, the risk of coronary heart disease is cut in half.  After 5 years, the risk of stroke falls to the same as a nonsmoker.  After 10 years, the risk of lung cancer is cut in half and the risk of other cancers decreases significantly.  After 15 years, the risk of coronary heart disease drops, usually to the level of a nonsmoker.  You will have extra money to spend on things other than cigarettes.   IF you triglycerides are still high we are going to send in a low dose of cholesterol medication to help you  Your trigs are not  in range, 08/10/2017: Triglycerides 403. Triglycerides are simple sugars in blood that are converted into a storage form. I recommend you avoid fried/greasy foods, sweets/candy, white rice , white potatoes,  anything made from white flour, sweet tea, soda, fruit juices and avoid alcohol in excess. Sweet potatoes, brown/wild rice/Quinoa, Vegetarian, spinach, or wheat pasta, Multi-grain bread - like multi-grain flat bread or sandwich thins are okay.   Here is some information to help you keep your heart healthy: Move it! - Aim for 30 mins of activity every day. Take it slowly at first. Talk to [place]  before starting any new exercise program.   Lose it.  -Body Mass Index (BMI) can indicate if you need to lose weight. A healthy range is 18.5-24.9. For a BMI calculator, go to [employer]  Waist Management -Excess abdominal fat is a risk factor for heart disease, diabetes, asthma, stroke and more. Ideal waist circumference is less than 35" for women and less than 40" for men.   Eat Right -focus on fruits, vegetables, whole grains, and meals you make yourself. Avoid foods with trans fat and high sugar/sodium content.   Snooze or Snore? - Loud snoring can be a sign of sleep apnea, a significant risk factor for high blood pressure, heart attach, stroke, and heart arrhythmias.  Kick the habit -Quit Smoking! Avoid second hand smoke. A single cigarette raises your blood pressure for 20 mins and increases the risk of heart attack and stroke for the next 24 hours.   Are Aspirin and Supplements right for you? -Add ENTERIC COATED low dose 81 mg Aspirin daily OR can do every other day if you have easy bruising to protect your heart and head. As well as to reduce risk of Colon Cancer by 20 %, Skin Cancer by 26 % , Melanoma by 46% and Pancreatic cancer by 60%  Say "No to Stress -There may be little you can do about problems that cause stress. However, techniques such as long walks, meditation, and exercise can help you manage it.   Start Now! - Make changes one at a time and set reasonable goals to increase your likelihood of success.

## 2018-02-10 LAB — CBC WITH DIFFERENTIAL/PLATELET
Basophils Absolute: 94 cells/uL (ref 0–200)
Basophils Relative: 1.1 %
EOS PCT: 4.2 %
Eosinophils Absolute: 357 cells/uL (ref 15–500)
HCT: 46.8 % (ref 38.5–50.0)
HEMOGLOBIN: 15.7 g/dL (ref 13.2–17.1)
Lymphs Abs: 1488 cells/uL (ref 850–3900)
MCH: 28.6 pg (ref 27.0–33.0)
MCHC: 33.5 g/dL (ref 32.0–36.0)
MCV: 85.2 fL (ref 80.0–100.0)
MONOS PCT: 9.5 %
MPV: 10.4 fL (ref 7.5–12.5)
NEUTROS ABS: 5755 {cells}/uL (ref 1500–7800)
Neutrophils Relative %: 67.7 %
PLATELETS: 285 10*3/uL (ref 140–400)
RBC: 5.49 10*6/uL (ref 4.20–5.80)
RDW: 12.5 % (ref 11.0–15.0)
TOTAL LYMPHOCYTE: 17.5 %
WBC mixed population: 808 cells/uL (ref 200–950)
WBC: 8.5 10*3/uL (ref 3.8–10.8)

## 2018-02-10 LAB — COMPLETE METABOLIC PANEL WITH GFR
AG Ratio: 1.7 (calc) (ref 1.0–2.5)
ALBUMIN MSPROF: 4.6 g/dL (ref 3.6–5.1)
ALT: 19 U/L (ref 9–46)
AST: 21 U/L (ref 10–35)
Alkaline phosphatase (APISO): 74 U/L (ref 40–115)
BUN: 20 mg/dL (ref 7–25)
CALCIUM: 9.8 mg/dL (ref 8.6–10.3)
CO2: 24 mmol/L (ref 20–32)
Chloride: 106 mmol/L (ref 98–110)
Creat: 1.16 mg/dL (ref 0.70–1.25)
GFR, EST AFRICAN AMERICAN: 79 mL/min/{1.73_m2} (ref >=60)
GFR, EST NON AFRICAN AMERICAN: 68 mL/min/{1.73_m2} (ref >=60)
Globulin: 2.7 g/dL (calc) (ref 1.9–3.7)
Glucose, Bld: 142 mg/dL — ABNORMAL HIGH (ref 65–99)
Potassium: 4.4 mmol/L (ref 3.5–5.3)
Sodium: 140 mmol/L (ref 135–146)
TOTAL PROTEIN: 7.3 g/dL (ref 6.1–8.1)
Total Bilirubin: 0.6 mg/dL (ref 0.2–1.2)

## 2018-02-10 LAB — URINALYSIS, ROUTINE W REFLEX MICROSCOPIC
Bilirubin Urine: NEGATIVE
HGB URINE DIPSTICK: NEGATIVE
KETONES UR: NEGATIVE
Leukocytes, UA: NEGATIVE
NITRITE: NEGATIVE
Protein, ur: NEGATIVE
Specific Gravity, Urine: 1.028 (ref 1.001–1.03)

## 2018-02-10 LAB — LIPID PANEL
CHOL/HDL RATIO: 6.2 (calc) — AB (ref <5.0)
Cholesterol: 186 mg/dL (ref <200)
HDL: 30 mg/dL — ABNORMAL LOW (ref >40)
LDL CHOLESTEROL (CALC): 116 mg/dL — AB
NON-HDL CHOLESTEROL (CALC): 156 mg/dL — AB (ref <130)
Triglycerides: 278 mg/dL — ABNORMAL HIGH (ref <150)

## 2018-02-10 LAB — VITAMIN D 25 HYDROXY (VIT D DEFICIENCY, FRACTURES): Vit D, 25-Hydroxy: 16 ng/mL — ABNORMAL LOW (ref 30–100)

## 2018-02-10 LAB — MAGNESIUM: Magnesium: 2 mg/dL (ref 1.5–2.5)

## 2018-02-10 LAB — MICROALBUMIN / CREATININE URINE RATIO
Creatinine, Urine: 170 mg/dL (ref 20–320)
Microalb Creat Ratio: 15 mcg/mg creat (ref <30)
Microalb, Ur: 2.6 mg/dL

## 2018-02-10 LAB — HEMOGLOBIN A1C
HEMOGLOBIN A1C: 5.6 %{Hb} (ref <5.7)
Mean Plasma Glucose: 114 (calc)
eAG (mmol/L): 6.3 (calc)

## 2018-02-10 LAB — TSH: TSH: 2.21 m[IU]/L (ref 0.40–4.50)

## 2018-02-10 LAB — PSA: PSA: 1.2 ng/mL (ref <=4.0)

## 2018-03-13 ENCOUNTER — Other Ambulatory Visit: Payer: Self-pay | Admitting: Physician Assistant

## 2018-03-13 ENCOUNTER — Encounter: Payer: Self-pay | Admitting: Pulmonary Disease

## 2018-03-13 ENCOUNTER — Ambulatory Visit (INDEPENDENT_AMBULATORY_CARE_PROVIDER_SITE_OTHER): Payer: 59 | Admitting: Pulmonary Disease

## 2018-03-13 VITALS — BP 116/66 | HR 93 | Ht 72.0 in | Wt 236.0 lb

## 2018-03-13 DIAGNOSIS — D869 Sarcoidosis, unspecified: Secondary | ICD-10-CM | POA: Diagnosis not present

## 2018-03-13 DIAGNOSIS — Z720 Tobacco use: Secondary | ICD-10-CM

## 2018-03-13 NOTE — Progress Notes (Signed)
Synopsis: Referred in 02/2018 for Sarcoidosis  Subjective:   PATIENT ID: Jerry Stewart: male DOB: 03/19/1956, MRN: [id number]   HPI  Chief Complaint  Patient presents with  . consult    doing well per patient - needs to re-establish with pulmonary MD for sarcoidosis    Jerry Stewart is a 62 year old male current smoker with sarcoidosis diagnosed via mediastinoscopy in 1995 and history of significant tobacco abuse who presents as a new consultation for sarcoidosis management.  He was previously seen by Dr. Shelle Ironlance for management of sarcoidosis.  He was last seen 10/2012.  Chest imaging has previously demonstrated mediastinal and hilar lymphadenopathy.  He feels overall well. He denies shortness of breath, cough. Denies chest. Denies fevers, chills, unintentional weight loss, abdominal pain or rashes.  Although he has no respiratory complaints, he was started on Anoro and does not feel any changes.  He intermittently takes inhaler.  His last eye exam with optometry one week ago for complaints of visual blurriness and had his glasses prescription updated. He has sister and aunt diagnosed with sarcoidosis with pulmonary and skin involvement. He is trying to cut back on smoking and has tried Chantix but discontinued due to vivid dreams and anxiousness. Currently on Wellbutrin.  Social History:  Active smoker. 3 packs per week.  States he used to smoke half a pack per day x15 years.  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  Past Medical History:  Diagnosis Date  . Chest pain 05/16/2012  . Colon polyps   . DDD (degenerative disc disease)   . Diabetes mellitus without complication (HCC)   . Hepatitis C    treated for hepatatis but not sure what type  . Hiatal hernia   . Hypercholesteremia   . Marijuana  use   . Osteoarthritis   . Sarcoid   . Thrombocytopenia (HCC)   . Tobacco abuse      Family History  Problem Relation Age of Onset  . Leukemia Mother   . Diabetes Mellitus II Mother   . Cancer Father        prostate  . Other Other        No family h/o CAD  . Sarcoidosis Sister   . Sarcoidosis Paternal Aunt   . Colon cancer Neg Hx   . Pancreatic cancer Neg Hx   . Rectal cancer Neg Hx   . Stomach cancer Neg Hx      Social History   Occupational History  . Occupation: laborer    Employer: [employer]    Comment: Warehouse  Tobacco Use  . Smoking status: Current Every Day Smoker    Packs/day: 0.50    Years: 30.00    Pack years: 15.00    Types: Cigarettes  . Smokeless tobacco: Never Used  . Tobacco comment: " i AM WORKING ON QUITTING MYSELF  Substance and Sexual Activity  . Alcohol use: Yes    Alcohol/week: 6.0 standard drinks    Types: 6 Cans of beer per week    Comment: several times a month  . Drug use: Yes    Frequency: 2.0 times per week    Types: Marijuana    Comment: Marijuana: 2-3 joints weekly  . Sexual activity: Yes    Allergies  Allergen Reactions  . Chantix [Varenicline]     Nausea  . Lopid [Gemfibrozil]  Nausea/weakness     Outpatient Medications Prior to Visit  Medication Sig Dispense Refill  . baclofen (LIORESAL) 10 MG tablet Take 1 tablet (10 mg total) by mouth 2 (two) times daily. Take 1/2 to 1 tablet 2 x day if needed for muscle spasm 60 tablet 1  . buPROPion (WELLBUTRIN XL) 150 MG 24 hr tablet TAKE 1 TABLET BY MOUTH EVERY DAY IN THE MORNING 90 tablet 1  . fenofibrate micronized (LOFIBRA) 134 MG capsule TAKE 1 CAPSULE BY MOUTH BEFORE BREAKFAST 90 capsule 1  . glucose blood (FREESTYLE LITE) test strip DX-E11.9  Check blood sugar 1 time a day 100 each 5  . Lancets (FREESTYLE) lancets Use as instructed test sugars 1 daily Dx code:E11.9 100 each 5  . meloxicam (MOBIC) 15 MG tablet TAKE 1 TABLET BY MOUTH EVERY DAY WITH FOOD FOR PAIN (Patient taking  differently: No sig reported) 90 tablet 0  . metFORMIN (GLUCOPHAGE-XR) 500 MG 24 hr tablet TAKE 1 TO 2 TABLETS TWICE A DAY FOR DIABETES 360 tablet 1  . umeclidinium-vilanterol (ANORO ELLIPTA) 62.5-25 MCG/INH AEPB Inhale 1 puff into the lungs daily. Make sure you rinse your mouth after each use. 1 each 0  . pantoprazole (PROTONIX) 40 MG tablet Take 1 tablet (40 mg total) daily by mouth. (Patient not taking: Reported on 12/22/2017) 90 tablet 0  . triamcinolone cream (KENALOG) 0.1 % Apply 1 application topically 3 (three) times daily. (Patient not taking: Reported on 03/13/2018) 45 g 0   No facility-administered medications prior to visit.     Review of Systems  Constitutional: Negative for chills, diaphoresis, fever, malaise/fatigue and weight loss.  HENT: Negative for congestion, sinus pain and sore throat.   Eyes: Positive for blurred vision. Negative for pain and redness.  Respiratory: Negative for cough, hemoptysis, sputum production, shortness of breath and wheezing.   Cardiovascular: Negative for chest pain, orthopnea, leg swelling and PND.  Gastrointestinal: Positive for heartburn. Negative for abdominal pain and nausea.  Genitourinary: Negative for frequency.  Musculoskeletal: Negative for myalgias.  Skin: Negative for rash.  Neurological: Negative for dizziness, weakness and headaches.  Endo/Heme/Allergies: Negative for environmental allergies. Does not bruise/bleed easily.     Objective:   Vitals:   03/13/18 1002  BP: 116/66  Pulse: 93  SpO2: 98%  Weight: 236 lb (107 kg)  Height: 6' (1.829 m)   SpO2: 98 % O2 Device: None (Room air)  Physical Exam General: Well-appearing, no acute distress HENT: [place], AT, OP clear, MMM Eyes: EOMI, no scleral icterus Respiratory: Clear to auscultation bilaterally.  No crackles, wheezing or rales Cardiovascular: RRR, -M/R/G, no JVD GI: BS+, soft, nontender, distended  Extremities:-Edema,-tenderness Neuro: AAO x4, CNII-XII grossly  intact Skin: Intact, no rashes or bruising Psych: Normal mood, normal affect  Chest imaging: CXR 02/08/2017-no evidence of hilar adenopathy compared to CXRs from 2009-20 14  PFT: 11/22/2011 No evidence of obstruction.  Mild restrictive defect.  Normal DLCO  I have personally reviewed the above labs, images and tests noted above.    Assessment & Plan:   Discussion: 62 year old male with history of sarcoidosis diagnosed via mediastinoscopy in 1995 who was previously followed here in pulmonary clinic but was lost to follow-up.  He presents today as a new consultation for sarcoidosis evaluation.  #Sarcoidosis Stage I per initial imaging in 2009 with mild hilar mediastinal adenopathy.  Recent CXR normal with no evidence of adenopathy or reticulation.  Currently asymptomatic.  No pulmonary or extrapulmonary manifestations present.  Does report recent visual changes.  PFTs with borderline restrictive defect. --ORDERED pulmonary function tests before your next visit. Remember to HOLD your inhalers 24-48 hours prior to testing if accurate results --REFER to Ophthalmology for routine exam for sarcoid --FOLLOW-UP CXR ordered by PCP --Okay to continue Anoro however will evaluate need for bronchodilators based on PFTs at next visit  #Tobacco abuse Patient is an active smoker. --We discussed smoking cessation for 3-5 minutes.  --We discussed triggers and stressors and ways to deal with them.  --We discussed barriers to continued smoking and benefits of smoking cessation.  --Patient not currently interested in quitting.  Orders Placed This Encounter  Procedures  . Ambulatory referral to Ophthalmology    Referral Priority:   Routine    Referral Type:   Consultation    Referral Reason:   Specialty Services Required    Requested Specialty:   Ophthalmology    Number of Visits Requested:   1  . Pulmonary function test    Standing Status:   Future    Standing Expiration Date:   03/14/2019     Scheduling Instructions:     PFT in 3 months with ROV    Order Specific Question:   Where should this test be performed?    Answer:   [facility]    Order Specific Question:   Full PFT: includes the following: basic spirometry, spirometry pre & post bronchodilator, diffusion capacity (DLCO), lung volumes    Answer:   Full PFT  No orders of the defined types were placed in this encounter.   Return in about 3 months (around 06/12/2018).  Atsushi Yom Mechele Collin, MD [facility] 03/13/2018 10:32 AM  Personal pager: [phone] If unanswered, please page CCM On-call: #[phone]

## 2018-03-13 NOTE — Patient Instructions (Addendum)
Sarcoidosis --ORDERED pulmonary function tests before your next visit. Remember to HOLD your inhalers 24-48 hours prior to testing if accurate results --REFER to Ophthalmology for routine exam for sarcoid  Follow-up in 3 months

## 2018-04-07 ENCOUNTER — Other Ambulatory Visit: Payer: Self-pay | Admitting: Adult Health Nurse Practitioner

## 2018-04-07 ENCOUNTER — Ambulatory Visit (INDEPENDENT_AMBULATORY_CARE_PROVIDER_SITE_OTHER): Payer: 59 | Admitting: Adult Health Nurse Practitioner

## 2018-04-07 ENCOUNTER — Encounter: Payer: Self-pay | Admitting: Adult Health Nurse Practitioner

## 2018-04-07 VITALS — BP 140/88 | HR 78 | Temp 97.7°F | Ht 72.0 in | Wt 243.6 lb

## 2018-04-07 DIAGNOSIS — M25552 Pain in left hip: Secondary | ICD-10-CM | POA: Diagnosis not present

## 2018-04-07 DIAGNOSIS — R7303 Prediabetes: Secondary | ICD-10-CM

## 2018-04-07 DIAGNOSIS — I10 Essential (primary) hypertension: Secondary | ICD-10-CM | POA: Diagnosis not present

## 2018-04-07 DIAGNOSIS — M5136 Other intervertebral disc degeneration, lumbar region: Secondary | ICD-10-CM | POA: Diagnosis not present

## 2018-04-07 DIAGNOSIS — K219 Gastro-esophageal reflux disease without esophagitis: Secondary | ICD-10-CM | POA: Diagnosis not present

## 2018-04-07 DIAGNOSIS — F3341 Major depressive disorder, recurrent, in partial remission: Secondary | ICD-10-CM

## 2018-04-07 DIAGNOSIS — E663 Overweight: Secondary | ICD-10-CM

## 2018-04-07 DIAGNOSIS — M51369 Other intervertebral disc degeneration, lumbar region without mention of lumbar back pain or lower extremity pain: Secondary | ICD-10-CM

## 2018-04-07 DIAGNOSIS — Z720 Tobacco use: Secondary | ICD-10-CM

## 2018-04-07 MED ORDER — PREDNISONE 20 MG PO TABS: ORAL_TABLET | ORAL | 0 refills | Status: DC

## 2018-04-07 NOTE — Patient Instructions (Addendum)
   Low back strain vs. increasing degenerative disc, radiculapathy  We discussed imaging if you have not improved.  You last X-ray was 2009   Ice low back for today  You may try [facility] as well.  Then change to heat, warm shower may help also  We will send in Prednisone taper and can use the baclofen to take as needed for muscle spasms.  If not improvement after 7-10 days please contact office  Once improved, then work on exercise to strengthen this area.  Below are examples and you can find them on-line as well.  Use reputable sites from physical therapy or from universities.

## 2018-04-07 NOTE — Progress Notes (Signed)
Assessment and Plan:  Jerry Stewart was seen today for hip pain.  Diagnoses and all orders for this visit:  Posterior pain of left hip -     predniSONE (DELTASONE) 20 MG tablet; 2 tablets daily for 3 days, 1 tablet daily for 4 days. Discussed imaging left hip, lumbar spine as last imaging was 10 years ago.  Patient desired to try oral medications first.   Discussed ice/heat for this Contact office if no improvement Once improved work on strengthening exercises   Essential hypertension Continue current regiment - continue medications, DASH diet, exercise and monitor at home. Call if greater than 130/80.   Gastroesophageal reflux disease, esophagitis presence not specified Doing well continue current regiment   DDD (degenerative disc disease), lumbar Contributing to left hip pain As noted discussed imaging as next step  Recurrent major depressive disorder, in partial remission (HCC) Doing well with this, continue current regiment at this time  Overweight Discussed dietary and exercise modifications Weight loss would decrease stress on joints and low back.    Tobacco abuse Discussed cessation with patient Not ready to quit at this time Will continue to assess readiness.  Prediabetes Continue dietary modifications Decrease carbohydrates and sugar    Call or return with new or worsening symptoms as discussed in appointment.  May contact via office phone [phone] or via MyChart.     Further disposition pending results of labs. Discussed med's effects and SE's.   Over 30 minutes of exam, counseling, chart review, and critical decision making was performed.   Future Appointments  Date Time Provider Department Center  05/18/2018  9:30 AM Quentin Mullingollier, Amanda, PA-C [facility] None  06/07/2018  9:00 AM [facility] None  06/07/2018 10:00 AM Luciano CutterEllison, Chi Jane, MD [facility] None  02/20/2019  9:00 AM Quentin Mullingollier, Amanda, PA-C [facility] None     ------------------------------------------------------------------------------------------------------------------ Reports pain began to increase two weeks ago on the left side extends to left gluteal.  This has been a chronic reoccurrence in the past although he was able to rest and use meloxicam to help decrease this pain.  He has also utilized baclofen in the past with resolving results.  He has now been taking these prn medication daily for the past two weeks.  Reports he sleeps with pillow between his legs to help alleviate the pain.  He has used heat that provides some comfort.  Now he reports shooting pain down his left leg with numbness and tingling. He reports the pain as 8/10 pain that increases to 10/10 when changing positions such as getting up from a chair or bed in the morning causing him to limp.  It has been constant and nothing makes it better.  Reports some decrease in pain 6/10 after moving around but not resolving.  He has had x-rays of lumbar spine in 2009 & 2010. LUMBAR SPINE - COMPLETE 4+ VIEW   Comparison: 08/28/2007   Findings: There are five lumbar type vertebral bodies identified. Vertebral body heights are maintained.  Mild disc space narrowing is seen at the L5-S1 level and this is stable in comparison to the prior exam.  No evidence for spondylosis or spondylolisthesis is seen.  There has been some mild progression of osteophytosis development at the L2-3, L3-4 and L4-5 levels since the prior exam.   The sacral white lines appear maintained.  Surgical clips are again noted in the right lower quadrant.   IMPRESSION: Some mild interval progression of osteophyte development as described above.  Otherwise stable mild degenerative changes.  Provider: Pasty Arch   As well as bilateral hip x-rays in 2010 that showed mild degenerative changes.   Past Medical History:  Diagnosis Date  . Chest pain 05/16/2012  . Colon polyps   . DDD (degenerative disc disease)    . Diabetes mellitus without complication (HCC)   . Hepatitis C    treated for hepatatis but not sure what type  . Hiatal hernia   . Hypercholesteremia   . Marijuana use   . Osteoarthritis   . Sarcoid   . Thrombocytopenia (HCC)   . Tobacco abuse      Allergies  Allergen Reactions  . Chantix [Varenicline]     Nausea  . Lopid [Gemfibrozil]     Nausea/weakness    Current Outpatient Medications on File Prior to Visit  Medication Sig  . baclofen (LIORESAL) 10 MG tablet Take 1 tablet (10 mg total) by mouth 2 (two) times daily. Take 1/2 to 1 tablet 2 x day if needed for muscle spasm  . buPROPion (WELLBUTRIN XL) 150 MG 24 hr tablet TAKE 1 TABLET BY MOUTH EVERY DAY IN THE MORNING  . fenofibrate micronized (LOFIBRA) 134 MG capsule TAKE 1 CAPSULE BY MOUTH BEFORE BREAKFAST  . glucose blood (FREESTYLE LITE) test strip DX-E11.9  Check blood sugar 1 time a day  . Lancets (FREESTYLE) lancets Use as instructed test sugars 1 daily Dx code:E11.9  . meloxicam (MOBIC) 15 MG tablet TAKE 1 TABLET BY MOUTH EVERY DAY WITH FOOD FOR PAIN (Patient taking differently: No sig reported)  . metFORMIN (GLUCOPHAGE-XR) 500 MG 24 hr tablet TAKE 1 TO 2 TABLETS TWICE A DAY FOR DIABETES  . pantoprazole (PROTONIX) 40 MG tablet Take 1 tablet (40 mg total) daily by mouth. (Patient not taking: Reported on 12/22/2017)  . triamcinolone cream (KENALOG) 0.1 % Apply 1 application topically 3 (three) times daily. (Patient not taking: Reported on 03/13/2018)  . umeclidinium-vilanterol (ANORO ELLIPTA) 62.5-25 MCG/INH AEPB Inhale 1 puff into the lungs daily. Make sure you rinse your mouth after each use. (Patient not taking: Reported on 04/07/2018)   No current facility-administered medications on file prior to visit.     ROS: all negative except above.   Physical Exam:  BP 140/88   Pulse 78   Temp 97.7 F (36.5 C)   Ht 6' (1.829 m)   Wt 243 lb 9.6 oz (110.5 kg)   SpO2 96%   BMI 33.04 kg/m   General Appearance: Well  nourished, in no apparent distress. Respiratory: Respiratory effort normal, BS equal bilaterally without rales, rhonchi, wheezing or stridor.  Cardio: RRR with no MRGs. Brisk peripheral pulses without edema.  Abdomen: Soft, obese + BS.  Non tender, no guarding, rebound, hernias, masses. Lymphatics: Non tender without lymphadenopathy.  Musculoskeletal: Full ROM to BUE and RLE with 5/5 strength. LLE guarded hesitancy with initiation of walking, Increased pain with abduction, extention and external rotation to left hip. Denies discomfort with adduction or internal rotation. 4/5 strength, with antalgic gait with mild improvement after a few steps. No point tenderness noted. Positive straight leg test.   Skin: Warm, dry without rashes, lesions, ecchymosis.  Neuro: Cranial nerves intact. Normal muscle tone, no cerebellar symptoms. Sensation intact. Psych: Awake and oriented X 3, normal affect, Insight and Judgment appropriate.     Elder Negus, NP 10:24 AM [facility]

## 2018-04-08 ENCOUNTER — Encounter: Payer: Self-pay | Admitting: Adult Health Nurse Practitioner

## 2018-05-16 NOTE — Progress Notes (Signed)
FOLLOW UP  Assessment and Plan:   Essential hypertension - continue medications, DASH diet, exercise and monitor at home. Call if greater than 130/80.  -     CBC with Differential/Platelet -     COMPLETE METABOLIC PANEL WITH GFR -     TSH  Hyperlipidemia, unspecified hyperlipidemia type check lipids decrease fatty foods increase activity.  -     Lipid panel  Thrombocytopenia (HCC) Stop drinking, monitor -     CBC with Differential/Platelet  Medication management -     Magnesium  Tobacco abuse Smoking cessation-  instruction/counseling given, counseled patient on the dangers of tobacco use, advised patient to stop smoking, and reviewed strategies to maximize success, patient not ready to quit at this time.   Sarcoidosis Follows with pulmonary  Acute left-sided low back pain without sciatica Try flexeril, get imaging Negative straight leg -     DG Lumbar Spine Complete; Future -     US Renal; Future  Abnormal glucose -     Hemoglobin A1c  Umbilical hernia without obstruction and without gangrene Declines referral at this time  Continue diet and meds as discussed. Further disposition pending results of labs. Discussed med's effects and SE's.   Over 30 minutes of exam, counseling, chart review, and critical decision making was performed.   Future Appointments  Date Time Provider Department Center  06/07/2018  9:00 AM [facility] None  06/07/2018 10:00 AM Luciano Cutter, MD [facility] None  02/20/2019  9:00 AM Quentin Mulling, PA-C [facility] None    ----------------------------------------------------------------------------------------------------------------------  HPI 63 y.o. male  presents for 3 month follow up on hypertension, cholesterol, glucose management, weight and vitamin D deficiency.   He complains of left lower back. He states the baclofen does not help. He was seen 04/07/2018 for left back/hip pain, given prednisone at that time but  states it is still bothering.   He currently continues to smoke 0.5-0.75 pack/day; discussed risks associated with smoking, patient is not ready to quit, but is thinking about restarting with nicotine gum soon. He could not tolerate chantix. He has a history of sarcoidosis, following with Dr. Everardo All. Has smoked x age 79, at least 40 pack year smoking history.   BMI is Body mass index is 32.55 kg/m., he has not been working on diet and exercise. Wt Readings from Last 3 Encounters:  05/18/18 240 lb (108.9 kg)  04/07/18 243 lb 9.6 oz (110.5 kg)  03/13/18 236 lb (107 kg)   His blood pressure has been controlled at home, today their BP is BP: 134/82  He does not workout. He denies chest pain, shortness of breath, dizziness other than episode this morning.   He is on cholesterol medication (fenofibrate) and denies myalgias. His cholesterol is not at goal. The cholesterol last visit was:   Lab Results  Component Value Date   CHOL 186 02/09/2018   HDL 30 (L) 02/09/2018   LDLCALC 116 (H) 02/09/2018   TRIG 278 (H) 02/09/2018   CHOLHDL 6.2 (H) 02/09/2018    He has not been working on diet and exercise for prediabetes (on metformin 2 tabs once daily), and denies foot ulcerations, increased appetite, nausea, paresthesia of the feet, polydipsia, polyuria, visual disturbances, vomiting and weight loss. Last A1C in the office was:  Lab Results  Component Value Date   HGBA1C 5.6 02/09/2018   Patient is currently on Vitamin D supplement and is below goal  Lab Results  Component Value Date   VD25OH 16 (L)  02/09/2018       Current Medications:  Current Outpatient Medications on File Prior to Visit  Medication Sig  . baclofen (LIORESAL) 10 MG tablet Take 1 tablet (10 mg total) by mouth 2 (two) times daily. Take 1/2 to 1 tablet 2 x day if needed for muscle spasm  . buPROPion (WELLBUTRIN XL) 150 MG 24 hr tablet TAKE 1 TABLET BY MOUTH EVERY DAY IN THE MORNING  . fenofibrate micronized (LOFIBRA) 134  MG capsule TAKE 1 CAPSULE BY MOUTH BEFORE BREAKFAST  . glucose blood (FREESTYLE LITE) test strip DX-E11.9  Check blood sugar 1 time a day  . Lancets (FREESTYLE) lancets Use as instructed test sugars 1 daily Dx code:E11.9  . meloxicam (MOBIC) 15 MG tablet TAKE 1 TABLET BY MOUTH EVERY DAY WITH FOOD FOR PAIN (Patient taking differently: No sig reported)  . metFORMIN (GLUCOPHAGE-XR) 500 MG 24 hr tablet TAKE 1 TO 2 TABLETS TWICE A DAY FOR DIABETES  . triamcinolone cream (KENALOG) 0.1 % Apply 1 application topically 3 (three) times daily.  [name] umeclidinium-vilanterol (ANORO ELLIPTA) 62.5-25 MCG/INH AEPB Inhale 1 puff into the lungs daily. Make sure you rinse your mouth after each use.  . pantoprazole (PROTONIX) 40 MG tablet Take 1 tablet (40 mg total) daily by mouth. (Patient not taking: Reported on 12/22/2017)   No current facility-administered medications on file prior to visit.      Allergies:  Allergies  Allergen Reactions  . Chantix [Varenicline]     Nausea  . Lopid [Gemfibrozil]     Nausea/weakness     Medical History:  Past Medical History:  Diagnosis Date  . Chest pain 05/16/2012  . Colon polyps   . DDD (degenerative disc disease)   . Diabetes mellitus without complication (HCC)   . Hepatitis C    treated for hepatatis but not sure what type  . Hiatal hernia   . Hypercholesteremia   . Marijuana use   . Osteoarthritis   . Sarcoid   . Thrombocytopenia (HCC)   . Tobacco abuse    Family history- Reviewed and unchanged Social history- Reviewed and unchanged   Review of Systems:  Review of Systems  Constitutional: Negative for malaise/fatigue and weight loss.  HENT: Negative for hearing loss and tinnitus.   Eyes: Negative for blurred vision and double vision.  Respiratory: Negative for cough, shortness of breath and wheezing.   Cardiovascular: Negative for chest pain, palpitations, orthopnea, claudication and leg swelling.  Gastrointestinal: Negative for abdominal pain,  blood in stool, constipation, diarrhea, heartburn, melena, nausea and vomiting.  Genitourinary: Positive for flank pain. Negative for dysuria, frequency, hematuria and urgency.  Musculoskeletal: Positive for back pain. Negative for falls, joint pain, myalgias and neck pain.  Skin: Negative for rash.  Neurological: Negative for dizziness, tingling, sensory change, weakness and headaches.  Endo/Heme/Allergies: Negative for polydipsia.  Psychiatric/Behavioral: Negative.   All other systems reviewed and are negative.     Physical Exam: BP 134/82   Pulse 88   Temp 97.8 F (36.6 C)   Ht 6' (1.829 m)   Wt 240 lb (108.9 kg)   SpO2 97%   BMI 32.55 kg/m  Wt Readings from Last 3 Encounters:  05/18/18 240 lb (108.9 kg)  04/07/18 243 lb 9.6 oz (110.5 kg)  03/13/18 236 lb (107 kg)   General Appearance: Well nourished, in no apparent distress. Eyes: PERRLA, EOMs, conjunctiva no swelling or erythema Sinuses: No Frontal/maxillary tenderness ENT/Mouth: Ext aud canals clear, TMs without erythema, bulging. No erythema, swelling, or  exudate on post pharynx.  Tonsils not swollen or erythematous. Hearing normal.  Neck: Supple, thyroid normal.  Respiratory: Respiratory effort normal, BS equal bilaterally without rales, rhonchi, wheezing or stridor.  Cardio: RRR with no MRGs. Brisk peripheral pulses without edema.  Abdomen: Soft, + BS.  + ventral/umbilical hernia. Non tender, no guarding, rebound, masses. + CVA tenderness on the left.  Lymphatics: Non tender without lymphadenopathy.  Musculoskeletal: Full ROM, 5/5 strength, Normal gait .negative straight leg raise, normal distal neurovascular exam.  Skin: Warm, dry without rashes, lesions, ecchymosis.  Neuro: Cranial nerves intact. No cerebellar symptoms.  Psych: Awake and oriented X 3, normal affect, Insight and Judgment appropriate.    Quentin MullingAmanda Collier, PA-C 9:47 AM [facility]

## 2018-05-18 ENCOUNTER — Ambulatory Visit (INDEPENDENT_AMBULATORY_CARE_PROVIDER_SITE_OTHER): Payer: 59 | Admitting: Physician Assistant

## 2018-05-18 ENCOUNTER — Encounter: Payer: Self-pay | Admitting: Physician Assistant

## 2018-05-18 VITALS — BP 134/82 | HR 88 | Temp 97.8°F | Ht 72.0 in | Wt 240.0 lb

## 2018-05-18 DIAGNOSIS — M545 Low back pain, unspecified: Secondary | ICD-10-CM

## 2018-05-18 DIAGNOSIS — K429 Umbilical hernia without obstruction or gangrene: Secondary | ICD-10-CM | POA: Insufficient documentation

## 2018-05-18 DIAGNOSIS — Z720 Tobacco use: Secondary | ICD-10-CM

## 2018-05-18 DIAGNOSIS — R7309 Other abnormal glucose: Secondary | ICD-10-CM

## 2018-05-18 DIAGNOSIS — I10 Essential (primary) hypertension: Secondary | ICD-10-CM

## 2018-05-18 DIAGNOSIS — E785 Hyperlipidemia, unspecified: Secondary | ICD-10-CM

## 2018-05-18 DIAGNOSIS — Z79899 Other long term (current) drug therapy: Secondary | ICD-10-CM

## 2018-05-18 DIAGNOSIS — D869 Sarcoidosis, unspecified: Secondary | ICD-10-CM

## 2018-05-18 DIAGNOSIS — D696 Thrombocytopenia, unspecified: Secondary | ICD-10-CM

## 2018-05-18 DIAGNOSIS — N281 Cyst of kidney, acquired: Secondary | ICD-10-CM

## 2018-05-18 MED ORDER — CYCLOBENZAPRINE HCL 10 MG PO TABS: 10.0000 mg | ORAL_TABLET | Freq: Every evening | ORAL | 0 refills | Status: DC | PRN

## 2018-05-18 NOTE — Patient Instructions (Addendum)
Your LDL could improve, ideally we want it under a 100.  Your LDL is the bad cholesterol that can lead to heart attack and stroke. To lower your number you can decrease your fatty foods, red meat, cheese, milk and increase fiber like whole grains and veggies. You can also add a fiber supplement like Citracel or Benefiber, these do not cause gas and bloating and are safe to use. Especially if you have a strong family history of heart disease or stroke or you have evidence of plaque on any imaging like a chest xray, we may discuss at your next office visit putting you on a medication to get your number below 100.    INFORMATION ABOUT YOUR XRAY  Go to [address] or the imaging center and give them your name. They will have the order and take you back. You do not any paper work, I should get the result back today or tomorrow. This order is good for a year.   YOU CAN CALL TO MAKE AN ULTRASOUND..  I have put in an order for an ultrasound for you to have You can set them up at your convenience by calling this number [phone] You will likely have the ultrasound at [address] [facility] [address]  If you have any issues call our office and we will set this up for you.    SMOKING CESSATION  [employer] society  [phone] for more information or for a free program for smoking cessation help.   You can call QUIT SMART 1-800-QUIT-NOW for free nicotine patches or replacement therapy- if they are out- keep calling  [facility] Can call for smoking cessation classes, [phone]  If you have a smart phone, please look up Smoke Free app, this will help you stay on track and give you information about money you have saved, life that you have gained back and a ton of more information.     ADVANTAGES OF QUITTING SMOKING  Within 20 minutes, blood pressure decreases. Your pulse is at normal level.  After 8 hours, carbon monoxide levels in the blood return to normal. Your  oxygen level increases.  After 24 hours, the chance of having a heart attack starts to decrease. Your breath, hair, and body stop smelling like smoke.  After 48 hours, damaged nerve endings begin to recover. Your sense of taste and smell improve.  After 72 hours, the body is virtually free of nicotine. Your bronchial tubes relax and breathing becomes easier.  After 2 to 12 weeks, lungs can hold more air. Exercise becomes easier and circulation improves.  After 1 year, the risk of coronary heart disease is cut in half.  After 5 years, the risk of stroke falls to the same as a nonsmoker.  After 10 years, the risk of lung cancer is cut in half and the risk of other cancers decreases significantly.  After 15 years, the risk of coronary heart disease drops, usually to the level of a nonsmoker.  You will have extra money to spend on things other than cigarettes.    VITAMIN D IS IMPORTANT  Vitamin D goal is between 60-80  Please make sure that you are taking your Vitamin D as directed.   It is very important as a natural anti-inflammatory   helping hair, skin, and nails, as well as reducing stroke and heart attack risk.   It helps your bones and helps with mood.  We want you on at least  5000 IU daily  It also decreases numerous cancer risks so please take it as directed.   Low Vit D is associated with a 200-300% higher risk for CANCER   and 200-300% higher risk for HEART   ATTACK  &  STROKE.    .....................................[name].  It is also associated with higher death rate at younger ages,   autoimmune diseases like Rheumatoid arthritis, Lupus, Multiple Sclerosis.     Also many other serious conditions, like depression, Alzheimer's  Dementia, infertility, muscle aches, fatigue, fibromyalgia - just to name a few.  +++++++++++++++++++  Can get liquid vitamin D from [employer]  OR here in [place] at  [facility] [address], [place], [place] [id number] Or you  can try [employer]   BACK PAIN  Try the exercises and other information in the back care manual.   You can take flexeril if needed at bedtime for muscle spasm. This can be taken up to every 8 hours, but causes sedation, so should not drive or operate heavy machinery while taking this medicine.   Go to the ER if you have any new weakness in your legs, have trouble controlling your urine or bowels, or have worsening pain.   If you are not better in 1-3 month we will refer you to ortho   Back pain Rehab Ask your health care provider which exercises are safe for you. Do exercises exactly as told by your health care provider and adjust them as directed. It is normal to feel mild stretching, pulling, tightness, or discomfort as you do these exercises, but you should stop right away if you feel sudden pain or your pain gets worse. Do not begin these exercises until told by your health care provider. Stretching and range of motion exercises These exercises warm up your muscles and joints and improve the movement and flexibility of your hips and your back. These exercises also help to relieve pain, numbness, and tingling. Exercise A: Sciatic nerve glide 1. Sit in a chair with your head facing down toward your chest. Place your hands behind your back. Let your shoulders slump forward. 2. Slowly straighten one of your knees while you tilt your head back as if you are looking toward the ceiling. Only straighten your leg as far as you can without making your symptoms worse. 3. Hold for __________ seconds. 4. Slowly return to the starting position. 5. Repeat with your other leg. Repeat __________ times. Complete this exercise __________ times a day. Exercise B: Knee to chest with hip adduction and internal rotation  1. Lie on your back on a firm surface with both legs straight. 2. Bend one of your knees and move it up toward your chest until you feel a gentle stretch in your lower back and buttock.  Then, move your knee toward the shoulder that is on the opposite side from your leg. ? Hold your leg in this position by holding onto the front of your knee. 3. Hold for __________ seconds. 4. Slowly return to the starting position. 5. Repeat with your other leg. Repeat __________ times. Complete this exercise __________ times a day. Exercise C: Prone extension on elbows  1. Lie on your abdomen on a firm surface. A bed may be too soft for this exercise. 2. Prop yourself up on your elbows. 3. Use your arms to help lift your chest up until you feel a gentle stretch in your abdomen and your lower back. ? This will place some of your body weight  on your elbows. If this is uncomfortable, try stacking pillows under your chest. ? Your hips should stay down, against the surface that you are lying on. Keep your hip and back muscles relaxed. 4. Hold for __________ seconds. 5. Slowly relax your upper body and return to the starting position. Repeat __________ times. Complete this exercise __________ times a day. Strengthening exercises These exercises build strength and endurance in your back. Endurance is the ability to use your muscles for a long time, even after they get tired. Exercise D: Pelvic tilt 1. Lie on your back on a firm surface. Bend your knees and keep your feet flat. 2. Tense your abdominal muscles. Tip your pelvis up toward the ceiling and flatten your lower back into the floor. ? To help with this exercise, you may place a small towel under your lower back and try to push your back into the towel. 3. Hold for __________ seconds. 4. Let your muscles relax completely before you repeat this exercise. Repeat __________ times. Complete this exercise __________ times a day. Exercise E: Alternating arm and leg raises  1. Get on your hands and knees on a firm surface. If you are on a hard floor, you may want to use padding to cushion your knees, such as an exercise mat. 2. Line up your  arms and legs. Your hands should be below your shoulders, and your knees should be below your hips. 3. Lift your left leg behind you. At the same time, raise your right arm and straighten it in front of you. ? Do not lift your leg higher than your hip. ? Do not lift your arm higher than your shoulder. ? Keep your abdominal and back muscles tight. ? Keep your hips facing the ground. ? Do not arch your back. ? Keep your balance carefully, and do not hold your breath. 4. Hold for __________ seconds. 5. Slowly return to the starting position and repeat with your right leg and your left arm. Repeat __________ times. Complete this exercise __________ times a day. Posture and body mechanics  Body mechanics refers to the movements and positions of your body while you do your daily activities. Posture is part of body mechanics. Good posture and healthy body mechanics can help to relieve stress in your body's tissues and joints. Good posture means that your spine is in its natural S-curve position (your spine is neutral), your shoulders are pulled back slightly, and your head is not tipped forward. The following are general guidelines for applying improved posture and body mechanics to your everyday activities. Standing   When standing, keep your spine neutral and your feet about hip-width apart. Keep a slight bend in your knees. Your ears, shoulders, and hips should line up.  When you do a task in which you stand in one place for a long time, place one foot up on a stable object that is 2-4 inches (5-10 cm) high, such as a footstool. This helps keep your spine neutral. Sitting   When sitting, keep your spine neutral and keep your feet flat on the floor. Use a footrest, if necessary, and keep your thighs parallel to the floor. Avoid rounding your shoulders, and avoid tilting your head forward.  When working at a desk or a computer, keep your desk at a height where your hands are slightly lower than  your elbows. Slide your chair under your desk so you are close enough to maintain good posture.  When working at a computer, place  your monitor at a height where you are looking straight ahead and you do not have to tilt your head forward or downward to look at the screen. Resting   When lying down and resting, avoid positions that are most painful for you.  If you have pain with activities such as sitting, bending, stooping, or squatting (flexion-based activities), lie in a position in which your body does not bend very much. For example, avoid curling up on your side with your arms and knees near your chest (fetal position).  If you have pain with activities such as standing for a long time or reaching with your arms (extension-based activities), lie with your spine in a neutral position and bend your knees slightly. Try the following positions: ? Lying on your side with a pillow between your knees. ? Lying on your back with a pillow under your knees. Lifting   When lifting objects, keep your feet at least shoulder-width apart and tighten your abdominal muscles.  Bend your knees and hips and keep your spine neutral. It is important to lift using the strength of your legs, not your back. Do not lock your knees straight out.  Always ask for help to lift heavy or awkward objects. This information is not intended to replace advice given to you by your health care provider. Make sure you discuss any questions you have with your health care provider. Document Released: 03/15/2005 Document Revised: 11/20/2015 Document Reviewed: 11/29/2014 Elsevier Interactive Patient Education  2018 Elsevier Inc.   Umbilical Hernia, Adult  A hernia is a bulge of tissue that pushes through an opening between muscles. An umbilical hernia happens in the abdomen, near the belly button (umbilicus). The hernia may contain tissues from the small intestine, large intestine, or fatty tissue covering the intestines  (omentum). Umbilical hernias in adults tend to get worse over time, and they require surgical treatment. There are several types of umbilical hernias. You may have:  A hernia located just above or below the umbilicus (indirect hernia). This is the most common type of umbilical hernia in adults.  A hernia that forms through an opening formed by the umbilicus (direct hernia).  A hernia that comes and goes (reducible hernia). A reducible hernia may be visible only when you strain, lift something heavy, or cough. This type of hernia can be pushed back into the abdomen (reduced).  A hernia that traps abdominal tissue inside the hernia (incarcerated hernia). This type of hernia cannot be reduced.  A hernia that cuts off blood flow to the tissues inside the hernia (strangulated hernia). The tissues can start to die if this happens. This type of hernia requires emergency treatment. What are the causes? An umbilical hernia happens when tissue inside the abdomen presses on a weak area of the abdominal muscles. What increases the risk? You may have a greater risk of this condition if you:  Are obese.  Have had several pregnancies.  Have a buildup of fluid inside your abdomen (ascites).  Have had surgery that weakens the abdominal muscles. What are the signs or symptoms? The main symptom of this condition is a painless bulge at or near the belly button. A reducible hernia may be visible only when you strain, lift something heavy, or cough. Other symptoms may include:  Dull pain.  A feeling of pressure. Symptoms of a strangulated hernia may include:  Pain that gets increasingly worse.  Nausea and vomiting.  Pain when pressing on the hernia.  Skin over the hernia  becoming red or purple.  Constipation.  Blood in the stool. How is this diagnosed? This condition may be diagnosed based on:  A physical exam. You may be asked to cough or strain while standing. These actions increase the  pressure inside your abdomen and force the hernia through the opening in your muscles. Your health care provider may try to reduce the hernia by pressing on it.  Your symptoms and medical history. How is this treated? Surgery is the only treatment for an umbilical hernia. Surgery for a strangulated hernia is done as soon as possible. If you have a small hernia that is not incarcerated, you may need to lose weight before having surgery. Follow these instructions at home:  Lose weight, if told by your health care provider.  Do not try to push the hernia back in.  Watch your hernia for any changes in color or size. Tell your health care provider if any changes occur.  You may need to avoid activities that increase pressure on your hernia.  Do not lift anything that is heavier than 10 lb (4.5 kg) until your health care provider says that this is safe.  Take over-the-counter and prescription medicines only as told by your health care provider.  Keep all follow-up visits as told by your health care provider. This is important. Contact a health care provider if:  Your hernia gets larger.  Your hernia becomes painful. Get help right away if:  You develop sudden, severe pain near the area of your hernia.  You have pain as well as nausea or vomiting.  You have pain and the skin over your hernia changes color.  You develop a fever. This information is not intended to replace advice given to you by your health care provider. Make sure you discuss any questions you have with your health care provider. Document Released: 08/15/2015 Document Revised: 04/27/2017 Document Reviewed: 09/13/2016 Elsevier Interactive Patient Education  2019 [employer].

## 2018-05-19 LAB — CBC WITH DIFFERENTIAL/PLATELET
Absolute Monocytes: 681 cells/uL (ref 200–950)
BASOS ABS: 67 {cells}/uL (ref 0–200)
Basophils Relative: 0.9 %
EOS ABS: 222 {cells}/uL (ref 15–500)
Eosinophils Relative: 3 %
HCT: 46.6 % (ref 38.5–50.0)
Hemoglobin: 15.3 g/dL (ref 13.2–17.1)
Lymphs Abs: 1517 cells/uL (ref 850–3900)
MCH: 28.3 pg (ref 27.0–33.0)
MCHC: 32.8 g/dL (ref 32.0–36.0)
MCV: 86.1 fL (ref 80.0–100.0)
MONOS PCT: 9.2 %
MPV: 10.3 fL (ref 7.5–12.5)
NEUTROS PCT: 66.4 %
Neutro Abs: 4914 cells/uL (ref 1500–7800)
PLATELETS: 212 10*3/uL (ref 140–400)
RBC: 5.41 10*6/uL (ref 4.20–5.80)
RDW: 12.7 % (ref 11.0–15.0)
TOTAL LYMPHOCYTE: 20.5 %
WBC: 7.4 10*3/uL (ref 3.8–10.8)

## 2018-05-19 LAB — COMPLETE METABOLIC PANEL WITH GFR
AG RATIO: 1.9 (calc) (ref 1.0–2.5)
ALBUMIN MSPROF: 4.5 g/dL (ref 3.6–5.1)
ALT: 22 U/L (ref 9–46)
AST: 19 U/L (ref 10–35)
Alkaline phosphatase (APISO): 78 U/L (ref 35–144)
BILIRUBIN TOTAL: 0.4 mg/dL (ref 0.2–1.2)
BUN: 18 mg/dL (ref 7–25)
CHLORIDE: 108 mmol/L (ref 98–110)
CO2: 23 mmol/L (ref 20–32)
Calcium: 9.8 mg/dL (ref 8.6–10.3)
Creat: 1.07 mg/dL (ref 0.70–1.25)
GFR, EST AFRICAN AMERICAN: 87 mL/min/{1.73_m2} (ref >=60)
GFR, Est Non African American: 75 mL/min/{1.73_m2} (ref >=60)
GLUCOSE: 111 mg/dL — AB (ref 65–99)
Globulin: 2.4 g/dL (calc) (ref 1.9–3.7)
Potassium: 4.2 mmol/L (ref 3.5–5.3)
Sodium: 140 mmol/L (ref 135–146)
Total Protein: 6.9 g/dL (ref 6.1–8.1)

## 2018-05-19 LAB — LIPID PANEL
Cholesterol: 163 mg/dL (ref <200)
HDL: 29 mg/dL — ABNORMAL LOW (ref >=40)
LDL Cholesterol (Calc): 97 mg/dL (calc)
NON-HDL CHOLESTEROL (CALC): 134 mg/dL — AB (ref <130)
TRIGLYCERIDES: 258 mg/dL — AB (ref <150)
Total CHOL/HDL Ratio: 5.6 (calc) — ABNORMAL HIGH (ref <5.0)

## 2018-05-19 LAB — HEMOGLOBIN A1C
EAG (MMOL/L): 7.1 (calc)
HEMOGLOBIN A1C: 6.1 %{Hb} — AB (ref <5.7)
Mean Plasma Glucose: 128 (calc)

## 2018-05-19 LAB — TSH: TSH: 1.54 m[IU]/L (ref 0.40–4.50)

## 2018-05-19 LAB — MAGNESIUM: MAGNESIUM: 2 mg/dL (ref 1.5–2.5)

## 2018-05-22 ENCOUNTER — Telehealth: Payer: Self-pay

## 2018-05-22 NOTE — Telephone Encounter (Signed)
Patient was referred to program for smokers. They are recommending patient to use patches. Please advise

## 2018-05-23 ENCOUNTER — Other Ambulatory Visit: Payer: Self-pay | Admitting: Internal Medicine

## 2018-05-23 DIAGNOSIS — Z0001 Encounter for general adult medical examination with abnormal findings: Secondary | ICD-10-CM

## 2018-05-23 DIAGNOSIS — E785 Hyperlipidemia, unspecified: Secondary | ICD-10-CM

## 2018-05-23 MED ORDER — NICOTINE 14 MG/24HR TD PT24: 14.0000 mg | MEDICATED_PATCH | TRANSDERMAL | 1 refills | Status: AC

## 2018-05-23 NOTE — Telephone Encounter (Signed)
Sent in patches to [facility] to see how much they are with his insurance, if it is expensive can try [facility] or [facility].   Also this number 1-800-QUIT-NOW for free nicotine patches or replacement therapy- if they are out- keep calling

## 2018-05-23 NOTE — Telephone Encounter (Signed)
Patient has been made aware of the QUIT smoking hot line & patient voice understanding

## 2018-05-23 NOTE — Addendum Note (Signed)
Addended by: Doree Albee on: 05/23/2018 08:41 AM   Modules accepted: Orders

## 2018-05-26 ENCOUNTER — Ambulatory Visit
Admission: RE | Admit: 2018-05-26 | Discharge: 2018-05-26 | Disposition: A | Discharge status: Home / Self Care | Payer: No Typology Code available for payment source | Source: Ambulatory Visit | Attending: Physician Assistant | Admitting: Physician Assistant

## 2018-05-26 DIAGNOSIS — M545 Low back pain, unspecified: Secondary | ICD-10-CM

## 2018-05-29 NOTE — Addendum Note (Signed)
Addended by: Quentin Mulling R on: 05/29/2018 01:23 PM   Modules accepted: Orders

## 2018-06-02 ENCOUNTER — Telehealth: Payer: Self-pay

## 2018-06-02 NOTE — Telephone Encounter (Signed)
Spoke with Jerry Stewart about MRI that he has already scheduled. FYI

## 2018-06-03 ENCOUNTER — Ambulatory Visit
Admission: RE | Admit: 2018-06-03 | Discharge: 2018-06-03 | Disposition: A | Discharge status: Home / Self Care | Payer: 59 | Source: Ambulatory Visit | Attending: Physician Assistant | Admitting: Physician Assistant

## 2018-06-03 DIAGNOSIS — N281 Cyst of kidney, acquired: Secondary | ICD-10-CM

## 2018-06-03 MED ORDER — GADOBENATE DIMEGLUMINE 529 MG/ML IV SOLN
20.0000 mL | Freq: Once | INTRAVENOUS | Status: AC | PRN
  Administered 2018-06-03: 20 mL via INTRAVENOUS

## 2018-06-07 ENCOUNTER — Ambulatory Visit: Payer: No Typology Code available for payment source | Admitting: Pulmonary Disease

## 2018-06-07 NOTE — Progress Notes (Deleted)
Subjective:   PATIENT ID: Jerry Stewart GENDER: male DOB: [birth year], MRN: [id number]   HPI  No chief complaint on file.   Reason for Visit: Follow-up for sarcoidosis  *** Social History: Active smoker.  15 pack years.  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 Chest pain 05/16/2012  . Colon polyps   . DDD (degenerative disc disease)   . Diabetes mellitus without complication (HCC)   . Hepatitis C    treated for hepatatis but not sure what type  . Hiatal hernia   . Hypercholesteremia   . Marijuana use   . Osteoarthritis   . Sarcoid   . Thrombocytopenia (HCC)   . Tobacco abuse      Family History  Problem Relation Age of Onset  . Leukemia Mother   . Diabetes Mellitus II Mother   . Cancer Father        prostate  . Other Other        No family h/o CAD  . Sarcoidosis Sister   . Sarcoidosis Paternal Aunt   . Colon cancer Neg Hx   . Pancreatic cancer Neg Hx   . Rectal cancer Neg Hx   . Stomach cancer Neg Hx      Social History   Occupational History  . Occupation: laborer    Employer: [employer]    Comment: Warehouse  Tobacco Use  . Smoking status: Current Every Day Smoker    Packs/day: 0.50    Years: 30.00    Pack years: 15.00    Types: Cigarettes  . Smokeless tobacco: Never Used  . Tobacco comment: " i AM WORKING ON QUITTING MYSELF  Substance and Sexual Activity  . Alcohol use: Yes    Alcohol/week: 6.0 standard drinks    Types: 6 Cans of beer per week    Comment: several times a month  . Drug use: Yes    Frequency: 2.0 times per week    Types: Marijuana    Comment: Marijuana: 2-3 joints weekly  . Sexual activity: Yes    Allergies  Allergen Reactions  . Chantix [Varenicline]     Nausea  . Lopid [Gemfibrozil]    Nausea/weakness     Outpatient Medications Prior to Visit  Medication Sig Dispense Refill  . baclofen (LIORESAL) 10 MG tablet Take 1 tablet (10 mg total) by mouth 2 (two) times daily. Take 1/2 to 1 tablet 2 x day if needed for muscle spasm 60 tablet 1  . buPROPion (WELLBUTRIN XL) 150 MG 24 hr tablet TAKE 1 TABLET BY MOUTH EVERY DAY IN THE MORNING 90 tablet 1  . cyclobenzaprine (FLEXERIL) 10 MG tablet Take 1 tablet (10 mg total) by mouth at bedtime as needed for muscle spasms. 60 tablet 0  . fenofibrate micronized (LOFIBRA) 134 MG capsule TAKE 1 CAPSULE BY MOUTH BEFORE BREAKFAST 90 capsule 1  . glucose blood (FREESTYLE LITE) test strip DX-E11.9  Check blood sugar 1 time a day 100 each 5  . Lancets (FREESTYLE) lancets Use as instructed test sugars 1 daily Dx code:E11.9 100 each 5  . meloxicam (MOBIC) 15 MG tablet TAKE 1 TABLET BY MOUTH EVERY DAY WITH FOOD FOR PAIN (Patient taking differently: No sig reported) 90 tablet 0  . metFORMIN (GLUCOPHAGE-XR) 500 MG 24 hr tablet TAKE 1 TO 2 TABLETS TWICE A  DAY FOR DIABETES 360 tablet 1  . nicotine (NICODERM CQ - DOSED IN MG/24 HOURS) 14 mg/24hr patch Place 1 patch (14 mg total) onto the skin daily. 30 patch 1  . pantoprazole (PROTONIX) 40 MG tablet Take 1 tablet (40 mg total) daily by mouth. (Patient not taking: Reported on 12/22/2017) 90 tablet 0  . triamcinolone cream (KENALOG) 0.1 % Apply 1 application topically 3 (three) times daily. 45 g 0   No facility-administered medications prior to visit.     ROS   Objective:  There were no vitals filed for this visit.    Physical Exam: General: Well-appearing, no acute distress HENT: [place], AT, OP clear, MMM Eyes: EOMI, no scleral icterus Lymph: no cervical lymphadenopathy Respiratory: Clear to auscultation bilaterally.  No crackles, wheezing or rales Cardiovascular: RRR, -M/R/G, no JVD GI: BS+, soft, nontender Extremities:-Edema,-tenderness Neuro: AAO x4, CNII-XII grossly intact Skin: Intact, no  rashes or bruising Psych: Normal mood, normal affect  Data Reviewed:  Imaging: CXR 02/08/2017-no evidence of hilar adenopathy compared to CXRs from 2009-2014  PFT: 11/22/2011 FVC 4.15 (82 %) FEV1 3.13 (86 %) Ratio 72 (LLN 76) TLC 77 % DLCO 82 % Interpretation: Mild restrictive defect present.  Per ATS guidelines, mild obstructive defect is also present.  Labs:  Imaging, labs and tests noted above have been reviewed independently by me.    Assessment & Plan:   Discussion: 63 year old male with history of sarcoidosis diagnosed via mediastinoscopy in 1995.  Previously followed by Dr. Shelle Iron but was lost to follow-up since 2014.  Sarcoidosis Stage I per initial imaging in 2009 with mild hilar mediastinal adenopathy.  Recent CXR normal with no evidence of adenopathy or reticulation.  Currently asymptomatic.  No pulmonary or extrapulmonary manifestations present.  Does report recent visual changes.  Health Maintenance Pneumonia*** Influenza*** CT Lung Screen***  No orders of the defined types were placed in this encounter. No orders of the defined types were placed in this encounter.   No follow-ups on file.   Mechele Collin, MD  Pulmonary Critical Care 06/07/2018 8:30 AM  Office Number [phone]

## 2018-06-11 ENCOUNTER — Other Ambulatory Visit: Payer: 59

## 2018-08-17 NOTE — Progress Notes (Signed)
Virtual Visit via Telephone Note  I connected with Oseph Breakiron on 08/18/18 at  9:30 AM EDT by telephone and verified that I am speaking with the correct person using two identifiers.  Location: Patient: home Provider: [facility] office    I discussed the limitations, risks, security and privacy concerns of performing an evaluation and management service by telephone and the availability of in person appointments. I also discussed with the patient that there may be a patient responsible charge related to this service. The patient expressed understanding and agreed to proceed.  I discussed the assessment and treatment plan with the patient. The patient was provided an opportunity to ask questions and all were answered. The patient agreed with the plan and demonstrated an understanding of the instructions.   The patient was advised to call back or seek an in-person evaluation if the symptoms worsen or if the condition fails to improve as anticipated.  I provided 17 minutes of non-face-to-face time during this encounter.   Dan Maker, NP    Assessment and Plan:  Prediabetes Advised to increase metformin to 4 tabs (2000 mg) daily consistently;  Reviewed dietary choices at length with both the patient and his wife  Decrease bad carbs like white bread, white rice, potatoes, corn, soft drinks, pasta, cereals, refined sugars, sweet tea, dried fruits, and fruit juice. Try to focus your diet on whole foods, focusing on fresh fruits and vegetables, beans, healthy fats (avocado, nuts, olives, seeds), and fish or lean protein if you choose to consume these in moderation. Good carbs are okay to eat in moderation like sweet potatoes, brown rice, whole grain pasta/bread. Exercise is also very helpful, as is adequate water intake - aim for 30 min of brisk walking daily, and 1/2 your body weight in fluid ounces of water.    Additionally discussed he should always check glucose fasting - goal is to get  <130, then back <100  Discussed and sent in glipizide - he is to take 1/2-1 tab of glipizide in the morning with breakfast if glucose is 150+; discussed risk of hypoglycemia, don't take if not eating or glucose low in AM  Hypoglycemia management reviewed  He will call back in 1 week with progress.   Further disposition pending results of labs. Discussed med's effects and SE's.   Over 20 minutes of exam, counseling, chart review, and critical decision making was performed.   Future Appointments  Date Time Provider Department Center  08/18/2018  9:30 AM Judd Gaudier, NP [facility] None  09/22/2018 10:15 AM Quentin Mulling, PA-C [facility] None  02/20/2019  9:00 AM Quentin Mulling, PA-C [facility] None    ------------------------------------------------------------------------------------------------------------------   HPI There were no vitals taken for this visit.  63 y.o.male, AA, with hx of prediabetes presents for discussion regarding recent reported hyperglycemia.   He reports was checking every other day fasting, historically reports was running 80s-100, reports this past week started noting this trending up these past 1-2 weeks, reports last few days:   5/19 156 before dinner, 161 after dinner 5/20 172 fasting, 156 before dinner 5/21 187 fasting, 135 at 1pm, skipped lunch  He is taking 3-4 tabs of 500 mg metformin daily  He attributes this change to being at home and eating more, admits not exercising much.   His glucometer is new - just got this year.   Component     Latest Ref Rng & Units 10/07/2016 08/10/2017 02/09/2018 05/18/2018  Hemoglobin A1C     <5.7 % of  total Hgb 5.2 5.9 (H) 5.6 6.1 (H)  Mean Plasma Glucose     (calc) 103 123 114 128      Past Medical History:  Diagnosis Date  . Chest pain 05/16/2012  . Colon polyps   . DDD (degenerative disc disease)   . Diabetes mellitus without complication (HCC)   . Hepatitis C    treated for hepatatis but not  sure what type  . Hiatal hernia   . Hypercholesteremia   . Marijuana use   . Osteoarthritis   . Sarcoid   . Thrombocytopenia (HCC)   . Tobacco abuse      Allergies  Allergen Reactions  . Chantix [Varenicline]     Nausea  . Lopid [Gemfibrozil]     Nausea/weakness    Current Outpatient Medications on File Prior to Visit  Medication Sig  . baclofen (LIORESAL) 10 MG tablet Take 1 tablet (10 mg total) by mouth 2 (two) times daily. Take 1/2 to 1 tablet 2 x day if needed for muscle spasm  . buPROPion (WELLBUTRIN XL) 150 MG 24 hr tablet TAKE 1 TABLET BY MOUTH EVERY DAY IN THE MORNING  . cyclobenzaprine (FLEXERIL) 10 MG tablet Take 1 tablet (10 mg total) by mouth at bedtime as needed for muscle spasms.  . fenofibrate micronized (LOFIBRA) 134 MG capsule TAKE 1 CAPSULE BY MOUTH BEFORE BREAKFAST  . glucose blood (FREESTYLE LITE) test strip DX-E11.9  Check blood sugar 1 time a day  . Lancets (FREESTYLE) lancets Use as instructed test sugars 1 daily Dx code:E11.9  . meloxicam (MOBIC) 15 MG tablet TAKE 1 TABLET BY MOUTH EVERY DAY WITH FOOD FOR PAIN (Patient taking differently: No sig reported)  . metFORMIN (GLUCOPHAGE-XR) 500 MG 24 hr tablet TAKE 1 TO 2 TABLETS TWICE A DAY FOR DIABETES  . nicotine (NICODERM CQ - DOSED IN MG/24 HOURS) 14 mg/24hr patch Place 1 patch (14 mg total) onto the skin daily.  . pantoprazole (PROTONIX) 40 MG tablet Take 1 tablet (40 mg total) daily by mouth. (Patient not taking: Reported on 12/22/2017)  . triamcinolone cream (KENALOG) 0.1 % Apply 1 application topically 3 (three) times daily.   No current facility-administered medications on file prior to visit.     ROS: all negative except above.   Physical Exam:  There were no vitals taken for this visit.  General : Well sounding patient in no apparent distress HEENT: no hoarseness, no cough for duration of visit Lungs: speaks in complete sentences, no audible wheezing, no apparent distress Neurological: alert,  oriented x 3 Psychiatric: pleasant, judgement appropriate      Dan MakerAshley C Lochlin Eppinger, NP 9:57 AM [facility]

## 2018-08-18 ENCOUNTER — Encounter: Payer: Self-pay | Admitting: Adult Health

## 2018-08-18 ENCOUNTER — Other Ambulatory Visit: Payer: Self-pay

## 2018-08-18 ENCOUNTER — Ambulatory Visit: Payer: 59 | Admitting: Adult Health

## 2018-08-18 DIAGNOSIS — R7303 Prediabetes: Secondary | ICD-10-CM

## 2018-08-18 DIAGNOSIS — R739 Hyperglycemia, unspecified: Secondary | ICD-10-CM | POA: Diagnosis not present

## 2018-08-18 MED ORDER — GLIPIZIDE 5 MG PO TABS: ORAL_TABLET | ORAL | 1 refills | Status: DC

## 2018-08-24 NOTE — Progress Notes (Signed)
Virtual Visit via Telephone Note  I connected with Jerry Stewart on 08/24/18 at 11:00 AM EDT by telephone and verified that I am speaking with the correct person using two identifiers.  Location: Patient: home Provider: [facility] office    I discussed the limitations, risks, security and privacy concerns of performing an evaluation and management service by telephone and the availability of in person appointments. I also discussed with the patient that there may be a patient responsible charge related to this service. The patient expressed understanding and agreed to proceed.  I discussed the assessment and treatment plan with the patient. The patient was provided an opportunity to ask questions and all were answered. The patient agreed with the plan and demonstrated an understanding of the instructions.   The patient was advised to call back or seek an in-person evaluation if the symptoms worsen or if the condition fails to improve as anticipated.  I provided 15 minutes of non-face-to-face time during this encounter.   Dan MakerAshley C Afnan Emberton, NP    Assessment and Plan:  Prediabetes Continue metformin 500 mg 4 times daily Discussed no need to check random glucose during the day, but needs to check fasting glucose DAILY and KEEP A LOG - bring with him to next OV  Hasn't been using glipizide as prescribed; nausea with 5 mg; discussed add 1/2 tab with breakfast daily if fasting 130+; if fasting values persistently remain above goal, add second 1/2 tab with dinner  Decrease bad carbs like white bread, white rice, potatoes, corn, soft drinks, pasta, cereals, refined sugars, sweet tea, dried fruits, and fruit juice. Try to focus your diet on whole foods, focusing on fresh fruits and vegetables, beans, healthy fats (avocado, nuts, olives, seeds), and fish or lean protein if you choose to consume these in moderation. Good carbs are okay to eat in moderation like sweet potatoes, brown rice, whole grain  pasta/bread. Exercise is also very helpful, as is adequate water intake - aim for 30 min of brisk walking daily, and 1/2 your body weight in fluid ounces of water.    Hypoglycemia management reviewed  He will follow up as scheduled; call if concerns with hyperglycemia despite interventions or any low glucose.   Instructions printed and mailed to the patient.   Further disposition pending results of labs. Discussed med's effects and SE's.   Over 20 minutes of exam, counseling, chart review, and critical decision making was performed.   Future Appointments  Date Time Provider Department Center  08/28/2018 11:00 AM Judd Gaudierorbett, Breindy Meadow, NP [facility] None  09/22/2018 10:15 AM Quentin Mullingollier, Amanda, PA-C [facility] None  02/20/2019  9:00 AM Quentin Mullingollier, Amanda, PA-C [facility] None   ------------------------------------------------------------------------------------------------------------------   HPI There were no vitals taken for this visit.  63 y.o.male, AA, with hx of prediabetes presents for 1 week follow up after medication adjustment for reports of hyperglycemia.   At the last visit:  He reported was checking every other day fasting, historically reports was running 80s-100, reports this past week started noting this trending up these past 1-2 weeks, reports last few days fasting ranged 150-187. He was advised to increase metformin to 4 tabs daily, glipizide 5 mg was prescribed and advised to take if fasting was 150+. Diet/lifestyle reviewed at length with patient and wife.   He reports fasting glucose for the past week  5/22 - 83 5/23 - fasting n/a 5/24 - 180 5/25 - fasting n/a 5/26 - fasting n/a 5/27 - 214  5/28 - fasting n/a Yesterday fasting  155  Admittedly not checking fasting very regularly, does check random glucose during the day without consistency.   He was taking 3-4 tabs of 500 mg metformin daily, glipizide - only took 1 tab, felt nauseous, tried 1/2 tab on one other occasion  but hasn't taken since  He attributedthis change to being at home and eating more, admited not exercising much.  His glucometer is new - just got this year.   Component     Latest Ref Rng & Units 10/07/2016 08/10/2017 02/09/2018 05/18/2018  Hemoglobin A1C     <5.7 % of total Hgb 5.2 5.9 (H) 5.6 6.1 (H)  Mean Plasma Glucose     (calc) 103 123 114 128      Past Medical History:  Diagnosis Date  . Chest pain 05/16/2012  . Colon polyps   . DDD (degenerative disc disease)   . Diabetes mellitus without complication (HCC)   . Hepatitis C    treated for hepatatis but not sure what type  . Hiatal hernia   . Hypercholesteremia   . Marijuana use   . Osteoarthritis   . Sarcoid   . Thrombocytopenia (HCC)   . Tobacco abuse      Allergies  Allergen Reactions  . Chantix [Varenicline]     Nausea  . Lopid [Gemfibrozil]     Nausea/weakness    Current Outpatient Medications on File Prior to Visit  Medication Sig  . buPROPion (WELLBUTRIN XL) 150 MG 24 hr tablet TAKE 1 TABLET BY MOUTH EVERY DAY IN THE MORNING  . CHOLECALCIFEROL PO Take 5,000 Units by mouth daily.  . cyclobenzaprine (FLEXERIL) 10 MG tablet Take 1 tablet (10 mg total) by mouth at bedtime as needed for muscle spasms.  . fenofibrate micronized (LOFIBRA) 134 MG capsule TAKE 1 CAPSULE BY MOUTH BEFORE BREAKFAST  . glipiZIDE (GLUCOTROL) 5 MG tablet Take 1/2-1 tab in the morning with breakfast if fasting sugar is 150+. Do not take if not eating or having low blood sugars.  [name] glucose blood (FREESTYLE LITE) test strip DX-E11.9  Check blood sugar 1 time a day  . Lancets (FREESTYLE) lancets Use as instructed test sugars 1 daily Dx code:E11.9  . meloxicam (MOBIC) 15 MG tablet TAKE 1 TABLET BY MOUTH EVERY DAY WITH FOOD FOR PAIN (Patient taking differently: No sig reported)  . metFORMIN (GLUCOPHAGE-XR) 500 MG 24 hr tablet TAKE 1 TO 2 TABLETS TWICE A DAY FOR DIABETES  . nicotine (NICODERM CQ - DOSED IN MG/24 HOURS) 14 mg/24hr patch Place 1  patch (14 mg total) onto the skin daily.  . pantoprazole (PROTONIX) 40 MG tablet Take 1 tablet (40 mg total) daily by mouth. (Patient not taking: Reported on 12/22/2017)   No current facility-administered medications on file prior to visit.     ROS: all negative except above.   Physical Exam:  There were no vitals taken for this visit.  General : Well sounding patient in no apparent distress HEENT: no hoarseness, no cough for duration of visit Lungs: speaks in complete sentences, no audible wheezing, no apparent distress Neurological: alert, oriented x 3 Psychiatric: pleasant, judgement appropriate      Dan Maker, NP 1:06 PM [facility]

## 2018-08-28 ENCOUNTER — Ambulatory Visit: Payer: 59 | Admitting: Adult Health

## 2018-08-28 ENCOUNTER — Other Ambulatory Visit: Payer: Self-pay

## 2018-08-28 ENCOUNTER — Encounter: Payer: Self-pay | Admitting: Adult Health

## 2018-08-28 DIAGNOSIS — R739 Hyperglycemia, unspecified: Secondary | ICD-10-CM | POA: Diagnosis not present

## 2018-08-28 DIAGNOSIS — R7303 Prediabetes: Secondary | ICD-10-CM

## 2018-08-28 MED ORDER — GLIPIZIDE 5 MG PO TABS: ORAL_TABLET | ORAL | 1 refills | Status: DC

## 2018-08-28 NOTE — Patient Instructions (Addendum)
Goals    . DIET - EAT MORE FRUITS AND VEGETABLES     5-7+ servings daily     . Exercise 150 min/wk Moderate Activity    . Fasting Blood Glucose <130     Check fasting glucose DAILY - keep a log to bring to each visit      Continue metformin 500 mg 4 times daily  No need to check random glucose during the day  Check fasting glucose DAILY and KEEP A LOG - bring with you to each office visit  Glipizide: take 1/2 tab with breakfast daily if fasting sugar is 130+  if fasting values persistently remain above goal, add second 1/2 tab with dinner (to take 1/2 tab twice daily with meals)  Decrease bad carbs like white bread, white rice, potatoes, corn, soft drinks, pasta, cereals, refined sugars, sweet tea, dried fruits, and fruit juice. Try to focus your diet on whole foods, focusing on fresh fruits and vegetables, beans, healthy fats (avocado, nuts, olives, seeds), and fish or lean protein if you choose to consume these in moderation. Good carbs are okay to eat in moderation like sweet potatoes, brown rice, whole grain pasta/bread. Exercise is also very helpful, as is adequate water intake - aim for 30 min of brisk walking daily, and 1/2 your body weight in fluid ounces of water.     Call if concerns with very high blood sugars despite interventions or any low blood sugars.     Glipizide tablets What is this medicine? GLIPIZIDE (GLIP i zide) helps to treat type 2 diabetes. Treatment is combined with diet and exercise. The medicine helps your body to use insulin better. This medicine may be used for other purposes; ask your health care provider or pharmacist if you have questions. COMMON BRAND NAME(S): Glucotrol What should I tell my health care provider before I take this medicine? They need to know if you have any of these conditions: -diabetic ketoacidosis -glucose-6-phosphate dehydrogenase deficiency -heart disease -kidney disease -liver disease -porphyria -severe infection or  injury -thyroid disease -an unusual or allergic reaction to glipizide, sulfa drugs, other medicines, foods, dyes, or preservatives -pregnant or trying to get pregnant -breast-feeding How should I use this medicine? Take this medicine by mouth. Swallow with a drink of water. Do not take with food. Take it 30 minutes before a meal. Follow the directions on the prescription label. If you take this medicine once a day, take it 30 minutes before breakfast. Take your doses at the same time each day. Do not take more often than directed. Talk to your pediatrician regarding the use of this medicine in children. Special care may be needed. Elderly patients over 36 years old may have a stronger reaction and need a smaller dose. Overdosage: If you think you have taken too much of this medicine contact a poison control center or emergency room at once. NOTE: This medicine is only for you. Do not share this medicine with others. What if I miss a dose? If you miss a dose, take it as soon as you can. If it is almost time for your next dose, take only that dose. Do not take double or extra doses. What may interact with this medicine? -bosentan -chloramphenicol -cisapride -clarithromycin -medicines for fungal or yeast infections -metoclopramide -probenecid -warfarin Many medications may cause an increase or decrease in blood sugar, these include: -alcohol containing beverages -aspirin and aspirin-like drugs -chloramphenicol -chromium -diuretics -male hormones, like estrogens or progestins and birth control pills -heart  medicines -isoniazid -male hormones or anabolic steroids -medicines for weight loss -medicines for allergies, asthma, cold, or cough -medicines for mental problems -medicines called MAO Inhibitors like Nardil, Parnate, Marplan, Eldepryl -niacin -NSAIDs, medicines for pain and inflammation, like ibuprofen or naproxen -pentamidine -phenytoin -probenecid -quinolone antibiotics  like ciprofloxacin, levofloxacin, ofloxacin -some herbal dietary supplements -steroid medicines like prednisone or cortisone -thyroid medicine This list may not describe all possible interactions. Give your health care provider a list of all the medicines, herbs, non-prescription drugs, or dietary supplements you use. Also tell them if you smoke, drink alcohol, or use illegal drugs. Some items may interact with your medicine. What should I watch for while using this medicine? Visit your doctor or health care professional for regular checks on your progress. A test called the HbA1C (A1C) will be monitored. This is a simple blood test. It measures your blood sugar control over the last 2 to 3 months. You will receive this test every 3 to 6 months. Learn how to check your blood sugar. Learn the symptoms of low and high blood sugar and how to manage them. Always carry a quick-source of sugar with you in case you have symptoms of low blood sugar. Examples include hard sugar candy or glucose tablets. Make sure others know that you can choke if you eat or drink when you develop serious symptoms of low blood sugar, such as seizures or unconsciousness. They must get medical help at once. Tell your doctor or health care professional if you have high blood sugar. You might need to change the dose of your medicine. If you are sick or exercising more than usual, you might need to change the dose of your medicine. Do not skip meals. Ask your doctor or health care professional if you should avoid alcohol. Many nonprescription cough and cold products contain sugar or alcohol. These can affect blood sugar. This medicine can make you more sensitive to the sun. Keep out of the sun. If you cannot avoid being in the sun, wear protective clothing and use sunscreen. Do not use sun lamps or tanning beds/booths. Wear a medical ID bracelet or chain, and carry a card that describes your disease and details of your medicine and  dosage times. What side effects may I notice from receiving this medicine? Side effects that you should report to your doctor or health care professional as soon as possible: -allergic reactions like skin rash, itching or hives, swelling of the face, lips, or tongue -breathing problems -dark urine -fever, chills, sore throat -signs and symptoms of low blood sugar such as feeling anxious, confusion, dizziness, increased hunger, unusually weak or tired, sweating, shakiness, cold, irritable, headache, blurred vision, fast heartbeat, loss of consciousness -unusual bleeding or bruising -yellowing of the eyes or skin Side effects that usually do not require medical attention (report to your doctor or health care professional if they continue or are bothersome): -diarrhea -dizziness -headache -heartburn -nausea -stomach gas This list may not describe all possible side effects. Call your doctor for medical advice about side effects. You may report side effects to FDA at 1-800-FDA-1088. Where should I keep my medicine? Keep out of the reach of children. Store at room temperature below 30 degrees C (86 degrees F). Throw away any unused medicine after the expiration date. NOTE: This sheet is a summary. It may not cover all possible information. If you have questions about this medicine, talk to your doctor, pharmacist, or health care provider.  2019 [employer] Standard (2012-06-28 14:42:46)

## 2018-09-22 ENCOUNTER — Ambulatory Visit: Payer: Self-pay | Admitting: Physician Assistant

## 2018-09-25 NOTE — Progress Notes (Signed)
THIS ENCOUNTER IS A VIRTUAL/TELEPHONE VISIT DUE TO COVID-19 - PATIENT WAS NOT SEEN IN THE OFFICE.  PATIENT HAS CONSENTED TO VIRTUAL VISIT / TELEMEDICINE VISIT  This provider placed a call to Jerry Stewart using telephone, his appointment was changed to a virtual office visit to reduce the risk of exposure to the COVID-19 virus and to help Jerry StaffGeorge Stewart remain healthy and safe. The virtual visit will also provide continuity of care. He verbalizes understanding.   FOLLOW UP  Assessment and Plan:   Essential hypertension - continue medications, DASH diet, exercise and monitor at home.  Call if greater than 130/80.   Hyperlipidemia, unspecified hyperlipidemia type check lipids decrease fatty foods increase activity.   Thrombocytopenia (HCC) Stop drinking, monitor  Medication management -     Magnesium  Tobacco abuse Smoking cessation-  instruction/counseling given, counseled patient on the dangers of tobacco use, advised patient to stop smoking, and reviewed strategies to maximize success, try the 7mcg patch to see if he does better with htat  Sarcoidosis Follows with pulmonary  Abnormal glucose Discussed disease progression and risks Discussed diet/exercise, weight management and risk modification   Continue diet and meds as discussed. Further disposition pending results of labs. Discussed med's effects and SE's.   Over 30 minutes of exam, counseling, chart review, and critical decision making was performed.   Future Appointments  Date Time Provider Department Center  02/20/2019  9:00 AM Quentin Mullingollier, Robert Sunga, PA-C [facility] None    ----------------------------------------------------------------------------------------------------------------------  HPI 63 y.o. male  presents for 3 month follow up on hypertension, cholesterol, glucose management, weight and vitamin D deficiency.  He has been out of work for 3 months, has been back at work 1st shift for 3 weeks. States that he  has been eating better with a regular schedule.    He currently continues to smoke 0.5-0.75 pack/day; discussed risks associated with smoking, He is doing the patches but states with going back to work the 14 made him feel funny. Jerry Stewart. He could not tolerate chantix. He has a history of sarcoidosis, following with Dr. Everardo Stewart. Has smoked x age 63, at least 40 pack year smoking history.   BMI is There is no height or weight on file to calculate BMI., he has not been working on diet and exercise. Wt Readings from Last 3 Encounters:  05/18/18 240 lb (108.9 kg)  04/07/18 243 lb 9.6 oz (110.5 kg)  03/13/18 236 lb (107 kg)   His blood pressure has been controlled at home but he BP cuff is out of batteries, will check tomorrow and call if it is worse.  He does not workout. He denies chest pain, shortness of breath, dizziness other than episode this morning.   He is on cholesterol medication (fenofibrate) and denies myalgias. His cholesterol is not at goal. The cholesterol last visit was:   Lab Results  Component Value Date   CHOL 163 05/18/2018   HDL 29 (L) 05/18/2018   LDLCALC 97 05/18/2018   TRIG 258 (H) 05/18/2018   CHOLHDL 5.6 (H) 05/18/2018    He has not been working on diet and exercise for prediabetes (on metformin 2 tabs once daily, has not been taking glipizide since his sugars have improved), and denies foot ulcerations, increased appetite, nausea, paresthesia of the feet, polydipsia, polyuria, visual disturbances, vomiting and weight loss. Last A1C in the office was:  Lab Results  Component Value Date   HGBA1C 6.1 (H) 05/18/2018   Patient is currently 10,000 vitamin a day.  Lab  Results  Component Value Date   VD25OH 16 (L) 02/09/2018       Current Medications:  Current Outpatient Medications on File Prior to Visit  Medication Sig  . buPROPion (WELLBUTRIN XL) 150 MG 24 hr tablet TAKE 1 TABLET BY MOUTH EVERY DAY IN THE MORNING  . CHOLECALCIFEROL PO Take 5,000 Units by mouth daily.   . cyclobenzaprine (FLEXERIL) 10 MG tablet Take 1 tablet (10 mg total) by mouth at bedtime as needed for muscle spasms.  . fenofibrate micronized (LOFIBRA) 134 MG capsule TAKE 1 CAPSULE BY MOUTH BEFORE BREAKFAST  . glipiZIDE (GLUCOTROL) 5 MG tablet Take 1/2 tab daily with breakfast if fasting glucose is 130+. Do not take if not eating or having low blood sugars. Take a second 1/2 tab with dinner if fasting remains persistently above 130.  . glucose blood (FREESTYLE LITE) test strip DX-E11.9  Check blood sugar 1 time a day  . Lancets (FREESTYLE) lancets Use as instructed test sugars 1 daily Dx code:E11.9  . metFORMIN (GLUCOPHAGE-XR) 500 MG 24 hr tablet TAKE 1 TO 2 TABLETS TWICE A DAY FOR DIABETES  . nicotine (NICODERM CQ - DOSED IN MG/24 HOURS) 14 mg/24hr patch Place 1 patch (14 mg total) onto the skin daily.  . pantoprazole (PROTONIX) 40 MG tablet Take 1 tablet (40 mg total) daily by mouth.   No current facility-administered medications on file prior to visit.      Allergies:  Allergies  Allergen Reactions  . Chantix [Varenicline]     Nausea  . Lopid [Gemfibrozil]     Nausea/weakness     Medical History:  Past Medical History:  Diagnosis Date  . Chest pain 05/16/2012  . Colon polyps   . DDD (degenerative disc disease)   . Diabetes mellitus without complication ([place])   . Hepatitis C    treated for hepatatis but not sure what type  . Hiatal hernia   . Hypercholesteremia   . Marijuana use   . Osteoarthritis   . Sarcoid   . Thrombocytopenia ([place])   . Tobacco abuse    Family history- Reviewed and unchanged Social history- Reviewed and unchanged   Review of Systems:  Review of Systems  Constitutional: Negative for malaise/fatigue and weight loss.  HENT: Negative for hearing loss and tinnitus.   Eyes: Negative for blurred vision and double vision.  Respiratory: Negative for cough, shortness of breath and wheezing.   Cardiovascular: Negative for chest pain, palpitations,  orthopnea, claudication and leg swelling.  Gastrointestinal: Negative for abdominal pain, blood in stool, constipation, diarrhea, heartburn, melena, nausea and vomiting.  Genitourinary: Negative for dysuria, flank pain, frequency, hematuria and urgency.  Musculoskeletal: Negative for back pain, falls, joint pain, myalgias and neck pain.  Skin: Negative for rash.  Neurological: Negative for dizziness, tingling, sensory change, weakness and headaches.  Endo/Heme/Allergies: Negative for polydipsia.  Psychiatric/Behavioral: Negative.   All other systems reviewed and are negative.     Physical Exam: There were no vitals taken for this visit. Wt Readings from Last 3 Encounters:  05/18/18 240 lb (108.9 kg)  04/07/18 243 lb 9.6 oz (110.5 kg)  03/13/18 236 lb (107 kg)   General Appearance:Well sounding, in no apparent distress.  ENT/Mouth: No hoarseness, No cough for duration of visit.  Respiratory: completing full sentences without distress, without audible wheeze Neuro: Awake and oriented X 3,  Psych:  Insight and Judgment appropriate.     Vicie Mutters, PA-C 4:02 PM [facility]

## 2018-09-27 ENCOUNTER — Encounter: Payer: Self-pay | Admitting: Physician Assistant

## 2018-09-27 ENCOUNTER — Other Ambulatory Visit: Payer: Self-pay

## 2018-09-27 ENCOUNTER — Ambulatory Visit: Payer: 59 | Admitting: Physician Assistant

## 2018-09-27 DIAGNOSIS — E663 Overweight: Secondary | ICD-10-CM | POA: Diagnosis not present

## 2018-09-27 DIAGNOSIS — I10 Essential (primary) hypertension: Secondary | ICD-10-CM | POA: Diagnosis not present

## 2018-09-27 DIAGNOSIS — Z79899 Other long term (current) drug therapy: Secondary | ICD-10-CM

## 2018-09-27 DIAGNOSIS — F3341 Major depressive disorder, recurrent, in partial remission: Secondary | ICD-10-CM

## 2018-09-27 DIAGNOSIS — D696 Thrombocytopenia, unspecified: Secondary | ICD-10-CM

## 2018-09-27 DIAGNOSIS — Z720 Tobacco use: Secondary | ICD-10-CM

## 2018-09-27 DIAGNOSIS — D869 Sarcoidosis, unspecified: Secondary | ICD-10-CM

## 2018-09-27 DIAGNOSIS — E785 Hyperlipidemia, unspecified: Secondary | ICD-10-CM

## 2018-09-27 MED ORDER — FAMOTIDINE 40 MG PO TABS: 40.0000 mg | ORAL_TABLET | Freq: Every evening | ORAL | 1 refills | Status: DC

## 2018-09-27 NOTE — Patient Instructions (Addendum)
Get on the pepcid daily Go to the 7 mcg of the nicotine gum    Silent reflux: Not all heartburn burns...[name].[name].[name].  What is LPR? Laryngopharyngeal reflux (LPR) or silent reflux is a condition in which acid that is made in the stomach travels up the esophagus (swallowing tube) and gets to the throat. Not everyone with reflux has a lot of heartburn or indigestion. In fact, many people with LPR never have heartburn. This is why LPR is called SILENT REFLUX, and the terms "Silent reflux" and "LPR" are often used interchangeably. Because LPR is silent, it is sometimes difficult to diagnose.  How can you tell if you have LPR?  [name]. Chronic hoarseness- Some people have hoarseness that comes and goes . throat clearing  . Cough . It can cause shortness of breath and cause asthma like symptoms. [name]. a feeling of a lump in the throat  . difficulty swallowing . a problem with too much nose and throat drainage.  . Some people will feel their esophagus spasm which feels like their heart beating hard and fast, this will usually be after a meal, at rest, or lying down at night.    How do I treat this? Treatment for LPR should be individualized, and your doctor will suggest the best treatment for you. Generally there are several treatments for LPR: . changing habits and diet to reduce reflux,  . medications to reduce stomach acid, and  . surgery to prevent reflux. Most people with LPR need to modify how and when they eat, as well as take some medication, to get well. Sometimes, nonprescription liquid antacids, such as Maalox, Gelucil and Mylanta are recommended. When used, these antacids should be taken four times each day - one tablespoon one hour after each meal and before bedtime. Dietary and lifestyle changes alone are not often enough to control LPR - medications that reduce stomach acid are also usually needed. These must be prescribed by our doctor.   TIPS FOR REDUCING REFLUX AND LPR Control your  LIFE-STYLE and your DIET! [name]. If you use tobacco, QUIT.  [name]. Smoking makes you reflux. After every cigarette you have some LPR.  . Don't wear clothing that is too tight, especially around the waist (trousers, corsets, belts).  . Do not lie down just after eating...in fact, do not eat within three hours of bedtime.  . You should be on a low-fat diet.  . Limit your intake of red meat.  . Limit your intake of butter.  [name]. Avoid fried foods.  . Avoid chocolate  . Avoid cheese.  [name]. Avoid eggs. [name]. Specifically avoid caffeine (especially coffee and tea), soda pop (especially cola) and mints.  . Avoid alcoholic beverages, particularly in the evening.  VITAMIN D IS IMPORTANT  Vitamin D goal is between 60-80  Please make sure that you are taking your Vitamin D as directed.   It is very important as a natural anti-inflammatory   helping hair, skin, and nails, as well as reducing stroke and heart attack risk.   It helps your bones and helps with mood.  We want you on at least 5000 IU daily  It also decreases numerous cancer risks so please take it as directed.   Low Vit D is associated with a 200-300% higher risk for CANCER   and 200-300% higher risk for HEART   ATTACK  &  STROKE.    .....................................[name].  It is also associated with higher death rate at younger ages,   autoimmune  diseases like Rheumatoid arthritis, Lupus, Multiple Sclerosis.     Also many other serious conditions, like depression, Alzheimer's  Dementia, infertility, muscle aches, fatigue, fibromyalgia - just to name a few.  +++++++++++++++++++  Can get liquid vitamin D from [facility] here in [place] at  [facility] alternatives [address], [place], [place] [address] Or you can try earth fare

## 2018-10-23 ENCOUNTER — Other Ambulatory Visit: Payer: Self-pay

## 2018-10-23 ENCOUNTER — Encounter: Payer: Self-pay | Admitting: Adult Health

## 2018-10-23 ENCOUNTER — Ambulatory Visit (INDEPENDENT_AMBULATORY_CARE_PROVIDER_SITE_OTHER): Payer: 59 | Admitting: Adult Health

## 2018-10-23 VITALS — BP 124/74 | HR 96 | Temp 97.3°F | Ht 72.0 in | Wt 238.0 lb

## 2018-10-23 DIAGNOSIS — M25561 Pain in right knee: Secondary | ICD-10-CM | POA: Diagnosis not present

## 2018-10-23 MED ORDER — MELOXICAM 15 MG PO TABS: ORAL_TABLET | ORAL | 2 refills | Status: DC

## 2018-10-23 NOTE — Patient Instructions (Signed)
Get on zinc 50 mg daily for men (good for immune system and helps with testosterone - online at [email or web address]  Vitamin C - 500 mg once or twice daily     Acute Knee Pain, Adult Acute knee pain is sudden and may be caused by damage, swelling, or irritation of the muscles and tissues that support your knee. The injury may result from:  A fall.  An injury to your knee from twisting motions.  A hit to the knee.  Infection. Acute knee pain may go away on its own with time and rest. If it does not, your health care provider may order tests to find the cause of the pain. These may include:  Imaging tests, such as an X-ray, MRI, or ultrasound.  Joint aspiration. In this test, fluid is removed from the knee.  Arthroscopy. In this test, a lighted tube is inserted into the knee and an image is projected onto a TV screen.  Biopsy. In this test, a sample of tissue is removed from the body and studied under a microscope. Follow these instructions at home: Pay attention to any changes in your symptoms. Take these actions to relieve your pain. If you have a knee sleeve or brace:   Wear the sleeve or brace as told by your health care provider. Remove it only as told by your health care provider.  Loosen the sleeve or brace if your toes tingle, become numb, or turn cold and blue.  Keep the sleeve or brace clean.  If the sleeve or brace is not waterproof: ? Do not let it get wet. ? Cover it with a watertight covering when you take a bath or shower. Activity  Rest your knee.  Do not do things that cause pain or make pain worse.  Avoid high-impact activities or exercises, such as running, jumping rope, or doing jumping jacks.  Work with a physical therapist to make a safe exercise program, as recommended by your health care provider. Do exercises as told by your physical therapist. Managing pain, stiffness, and swelling   If directed, put ice on the knee: ? Put ice in a plastic bag. ?  Place a towel between your skin and the bag. ? Leave the ice on for 20 minutes, 2-3 times a day.  If directed, use an elastic bandage to put pressure (compression) on your injured knee. This may control swelling, give support, and help with discomfort. General instructions  Take over-the-counter and prescription medicines only as told by your health care provider.  Raise (elevate) your knee above the level of your heart when you are sitting or lying down.  Sleep with a pillow under your knee.  Do not use any products that contain nicotine or tobacco, such as cigarettes, e-cigarettes, and chewing tobacco. These can delay healing. If you need help quitting, ask your health care provider.  If you are overweight, work with your health care provider and a dietitian to set a weight-loss goal that is healthy and reasonable for you. Extra weight can put pressure on your knee.  Keep all follow-up visits as told by your health care provider. This is important. Contact a health care provider if:  Your knee pain continues, changes, or gets worse.  You have a fever along with knee pain.  Your knee feels warm to the touch.  Your knee buckles or locks up. Get help right away if:  Your knee swells, and the swelling becomes worse.  You cannot move your knee.  You have severe pain in your knee. Summary  Acute knee pain can be caused by a fall, an injury, an infection, or damage, swelling, or irritation of the tissues that support your knee.  Your health care provider may perform tests to find out the cause of the pain.  Pay attention to any changes in your symptoms. Relieve your pain with rest, medicines, light activity, and use of ice.  Get help if your pain continues or becomes worse, your knee swells, or you cannot move your knee. This information is not intended to replace advice given to you by your health care provider. Make sure you discuss any questions you have with your health care  provider. Document Released: 01/10/2007 Document Revised: 08/25/2017 Document Reviewed: 08/25/2017 Elsevier Patient Education  2020 [employer]

## 2018-10-23 NOTE — Progress Notes (Signed)
Assessment and Plan:  [name] was seen today for dizziness, hip pain and knee pain.  Diagnoses and all orders for this visit:  Medial knee pain, right Acute, ? Reports history of steroid injection remotely but unsure why; no hx of chronic pain Knee Pain; inconsistent pain location with exam, generalized medially, exam unremarkable excepting pain Natural history and expected course discussed. Questions answered. Transport plannerducational materials distributed. Rest, ice, compression, and elevation (RICE) therapy. Reduction in offending activity. NSAIDs per medication orders.  Follow up if no significant improvement in 2 weeks or if improved but persisting past 12 weeks, can consider xray vs ortho referral.  -     meloxicam (MOBIC) 15 MG tablet; Take once daily for 2 weeks, then 1/2-1 tab daily as needed. Can take with tylenol, do not take with ibuprofen or aleve.   Further disposition pending results of labs. Discussed med's effects and SE's.   Over 15 minutes of exam, counseling, chart review, and critical decision making was performed.   Future Appointments  Date Time Provider Department Center  02/20/2019  9:00 AM Quentin Mullingollier, Amanda, PA-C [facility] None    ------------------------------------------------------------------------------------------------------------------   HPI BP 124/74   Pulse 96   Temp (!) 97.3 F (36.3 C)   Ht 6' (1.829 m)   Wt 238 lb (108 kg)   SpO2 99%   BMI 32.28 kg/m   63 y.o.male presents for right knee pain x 1.5 weeks, discomfort and intermittent swelling after he pivoted and had a sensation of twisting his knee. He reports a constant pain, medial, sharp pain, non-radiating. 7-8/10, keeps him up at night; he has flexeril and has been taking but without much benefit; he is taking ibuprofen 200 mg once daily without adequate relief.   He reports he has had steroid shots in this knee remotely but no issues recently, he is not aware of hx of arthritis dx of R knee. No  imaging in system for review.   He also reports posterior R hip pain that began a few days after the knee, aching, intermittent, with "swollen" sensation through gluteal area. Denies lumbar back pain, radiating pain, numbness, tinging, weakness, popping or clicking.   Past Medical History:  Diagnosis Date  . Chest pain 05/16/2012  . Colon polyps   . DDD (degenerative disc disease)   . Diabetes mellitus without complication (HCC)   . Hepatitis C    treated for hepatatis but not sure what type  . Hiatal hernia   . Hypercholesteremia   . Marijuana use   . Osteoarthritis   . Sarcoid   . Thrombocytopenia (HCC)   . Tobacco abuse      Allergies  Allergen Reactions  . Chantix [Varenicline]     Nausea  . Lopid [Gemfibrozil]     Nausea/weakness    Current Outpatient Medications on File Prior to Visit  Medication Sig  . CHOLECALCIFEROL PO Take 5,000 Units by mouth daily.  . cyclobenzaprine (FLEXERIL) 10 MG tablet Take 1 tablet (10 mg total) by mouth at bedtime as needed for muscle spasms.  . famotidine (PEPCID) 40 MG tablet Take 1 tablet (40 mg total) by mouth every evening.  . fenofibrate micronized (LOFIBRA) 134 MG capsule TAKE 1 CAPSULE BY MOUTH BEFORE BREAKFAST  . glucose blood (FREESTYLE LITE) test strip DX-E11.9  Check blood sugar 1 time a day  . Lancets (FREESTYLE) lancets Use as instructed test sugars 1 daily Dx code:E11.9  . metFORMIN (GLUCOPHAGE-XR) 500 MG 24 hr tablet TAKE 1 TO 2 TABLETS TWICE  A DAY FOR DIABETES  . pantoprazole (PROTONIX) 40 MG tablet Take 1 tablet (40 mg total) daily by mouth.  [name] buPROPion (WELLBUTRIN XL) 150 MG 24 hr tablet TAKE 1 TABLET BY MOUTH EVERY DAY IN THE MORNING (Patient not taking: Reported on 10/23/2018)  . glipiZIDE (GLUCOTROL) 5 MG tablet Take 1/2 tab daily with breakfast if fasting glucose is 130+. Do not take if not eating or having low blood sugars. Take a second 1/2 tab with dinner if fasting remains persistently above 130. (Patient not  taking: Reported on 10/23/2018)  . nicotine (NICODERM CQ - DOSED IN MG/24 HOURS) 14 mg/24hr patch Place 1 patch (14 mg total) onto the skin daily. (Patient not taking: Reported on 10/23/2018)   No current facility-administered medications on file prior to visit.     ROS: all negative except above.   Physical Exam:  BP 124/74   Pulse 96   Temp (!) 97.3 F (36.3 C)   Ht 6' (1.829 m)   Wt 238 lb (108 kg)   SpO2 99%   BMI 32.28 kg/m   General Appearance: Well nourished, in no apparent distress. Eyes: conjunctiva no swelling or erythema ENT/Mouth:  Hearing normal.  Neck: Supple Respiratory: Respiratory effort normal Cardio: RRR with no MRGs. Brisk peripheral pulses without edema.  Abdomen: Soft, + BS.  Non tender, no guarding, rebound, hernias, masses. Lymphatics: Non tender without lymphadenopathy.  Musculoskeletal: Full ROM, symmetrical strength, antalgic gait. No palpable abnormality, laxity, effusion, heat of R knee; medial pain with ROM, location is inconsistent, intermittently indicates pain at joint and above medially; tenderness through R lumbar and gluteal musculature, no greater trochanter tenderness, no SI joint tenderness, neg straight leg raise.  Skin: Warm, dry without rashes, lesions, ecchymosis.  Neuro: Normal muscle tone, no cerebellar symptoms. Sensation intact.  Psych: Awake and oriented X 3, normal affect, Insight and Judgment appropriate.     Izora Ribas, NP 4:33 PM [facility]

## 2018-10-27 ENCOUNTER — Other Ambulatory Visit: Payer: Self-pay | Admitting: Internal Medicine

## 2018-11-30 ENCOUNTER — Other Ambulatory Visit: Payer: Self-pay | Admitting: Internal Medicine

## 2018-11-30 DIAGNOSIS — Z0001 Encounter for general adult medical examination with abnormal findings: Secondary | ICD-10-CM

## 2018-11-30 DIAGNOSIS — E785 Hyperlipidemia, unspecified: Secondary | ICD-10-CM

## 2019-02-19 DIAGNOSIS — R7309 Other abnormal glucose: Secondary | ICD-10-CM | POA: Insufficient documentation

## 2019-02-19 DIAGNOSIS — E559 Vitamin D deficiency, unspecified: Secondary | ICD-10-CM | POA: Insufficient documentation

## 2019-02-19 DIAGNOSIS — E1169 Type 2 diabetes mellitus with other specified complication: Secondary | ICD-10-CM | POA: Insufficient documentation

## 2019-02-19 NOTE — Progress Notes (Signed)
Complete Physical  Assessment and Plan:  Encounter for general adult medical examination with abnormal findings -     CBC with Diff -     COMPLETE METABOLIC PANEL WITH GFR -     TSH -     Lipid Profile -     Hemoglobin A1c ([facility]) -     Magnesium -     Vitamin D (25 hydroxy) -     Urinalysis, Routine w reflex microscopic -     Microalbumin / Creatinine Urine Ratio -     Iron,Total/Total Iron Binding Cap -     Vitamin B12 -     Testosterone, Total -     PSA -     EKG 12-Lead -     DG Chest 2 View; Future -     Uric acid -     Vitamin D, Ergocalciferol, (DRISDOL) 1.25 MG (50000 UT) CAPS capsule; 1 pill 3 days a week for vitamin d deficiency  Sarcoidosis -     DG Chest 2 View; Future - advised to quit smoking, get CXR  Thrombocytopenia (HCC) -     CBC with Diff - continue to monitor  Essential hypertension -     CBC with Diff -     COMPLETE METABOLIC PANEL WITH GFR -     TSH -     Urinalysis, Routine w reflex microscopic -     Microalbumin / Creatinine Urine Ratio -     EKG 12-Lead - continue medications, DASH diet, exercise and monitor at home. Call if greater than 130/80.   Recurrent major depressive disorder, in partial remission (HCC) - continue medications, stress management techniques discussed, increase water, good sleep hygiene discussed, increase exercise, and increase veggies.   Hyperlipidemia, unspecified hyperlipidemia type -     Lipid Profile check lipids decrease fatty foods increase activity.   Hepatitis C virus infection without hepatic coma, unspecified chronicity Treated and resolved  Abnormal glucose -     Hemoglobin A1c ([facility]) Discussed disease progression and risks Discussed diet/exercise, weight management and risk modification  Medication management -     Magnesium  Tobacco abuse -     DG Chest 2 View; Future Does not qualify for low dose CT at this time, get CXR Has tried and failed patches, chantix, continue wellbutrin for  now  Polyp of colon, unspecified part of colon, unspecified type Due 2025  Hiatal hernia Monitor  DDD (degenerative disc disease), lumbar -     meloxicam (MOBIC) 15 MG tablet; Take once daily for 2 weeks, then 1/2-1 tab daily as needed. Can take with tylenol, do not take with ibuprofen or aleve.  Gastroesophageal reflux disease, unspecified whether esophagitis present Continue PPI/H2 blocker, diet discussed  Overweight - long discussion about weight loss, diet, and exercise -recommended diet heavy in fruits and veggies and low in animal meats, cheeses, and dairy products  Vitamin D deficiency -     Vitamin D (25 hydroxy) -     Vitamin D, Ergocalciferol, (DRISDOL) 1.25 MG (50000 UT) CAPS capsule; 1 pill 3 days a week for vitamin d deficiency Discussed that def can worsen COVID outcomes, will do vitamin D RX 3 x a week for 3 months and recheck.   Arthralgia, unspecified joint -     Uric acid -     meloxicam (MOBIC) 15 MG tablet; Take once daily for 2 weeks, then 1/2-1 tab daily as needed. Can take with tylenol, do not take with ibuprofen or aleve.  Rule out gout, possible OA, do mobic AS needed and get better shoes.   Has had negative autoimmune work up in 2016 May need referral ortho for knees/pains if not better  Screening PSA (prostate specific antigen) -     PSA  Screening, anemia, deficiency, iron -     Iron,Total/Total Iron Binding Cap -     Vitamin B12  Testosterone deficiency in male -     Testosterone, Total  Medial knee pain, right -     meloxicam (MOBIC) 15 MG tablet; Take once daily for 2 weeks, then 1/2-1 tab daily as needed. Can take with tylenol, do not take with ibuprofen or aleve.  Discussed med's effects and SE's. Screening labs and tests as requested with regular follow-up as recommended. Future Appointments  Date Time Provider Department Center  02/20/2020  9:00 AM Quentin Mulling, PA-C [facility] None    HPI Patient presents for a complete physical  and follow up. [name]   He is complaining of bilateral knee pain but for the last 2 days he has had right ankle pain. No warmth, no injury. He has had a negative autoimmune work up in 2016.   He smokes and has sarcoidosis, followed with Dr. Shelle Iron and has seen Dr. [place] in the past but it has been years, had visit with Dr. Everardo All in 02/2018.  Denies SOB but states that he is not moving around as much. He continues to smoke, smokes a half a pack a day x 20 years, he states he can not handle the patches. He is on wellbutrin but states it is not better. he tried chantix but states he could not continue due to side effects. He is very concerned that at [employer] where he works in [place], people are not wearing masks in doors. He tries to stay away from them.     His blood pressure has been controlled at home, today their BP is BP: 130/62 He does not workout. He denies chest pain, shortness of breath, dizziness.  He is on cholesterol medication, fenofibrate and denies myalgias. His cholesterol is not at goal. The cholesterol last visit was:   Lab Results  Component Value Date   CHOL 163 05/18/2018   HDL 29 (L) 05/18/2018   LDLCALC 97 05/18/2018   TRIG 258 (H) 05/18/2018   CHOLHDL 5.6 (H) 05/18/2018    He has been working on diet and exercise for prediabetes, he is on metformin, and denies paresthesia of the feet, polydipsia and polyuria. Last A1C in the office was:  Lab Results  Component Value Date   HGBA1C 6.1 (H) 05/18/2018   Patient is on Vitamin D supplement.   Lab Results  Component Value Date   VD25OH 16 (L) 02/09/2018     Last PSA is  Lab Results  Component Value Date   PSA 1.2 02/09/2018   PSA 1.5 02/08/2017   PSA 1.1 02/09/2016   He did have low thimine and has been on B complex.  He has a history of hepatitis C, followed with Dr. Lucas Mallow, treated on 04/22/2014, Harvoni, and he has decreased his beer drinking, has been drinking [employer]. Has had Hep B vaccine, no longer  needs to follow up, will follow up here.   BMI is Body mass index is 31.83 kg/m., he is working on diet and exercise. Wt Readings from Last 3 Encounters:  02/20/19 238 lb (108 kg)  10/23/18 238 lb (108 kg)  05/18/18 240 lb (108.9 kg)  Current Medications:  Current Outpatient Medications on File Prior to Visit  Medication Sig Dispense Refill  . buPROPion (WELLBUTRIN XL) 150 MG 24 hr tablet TAKE 1 TABLET BY MOUTH EVERY DAY IN THE MORNING (Patient not taking: Reported on 10/23/2018) 90 tablet 1  . CHOLECALCIFEROL PO Take 5,000 Units by mouth daily.    . cyclobenzaprine (FLEXERIL) 10 MG tablet Take 1 tablet (10 mg total) by mouth at bedtime as needed for muscle spasms. 60 tablet 0  . famotidine (PEPCID) 40 MG tablet Take 1 tablet (40 mg total) by mouth every evening. 90 tablet 1  . fenofibrate micronized (LOFIBRA) 134 MG capsule TAKE 1 CAPSULE BY MOUTH BEFORE BREAKFAST 90 capsule 1  . glipiZIDE (GLUCOTROL) 5 MG tablet Take 1/2 tab daily with breakfast if fasting glucose is 130+. Do not take if not eating or having low blood sugars. Take a second 1/2 tab with dinner if fasting remains persistently above 130. (Patient not taking: Reported on 10/23/2018) 90 tablet 1  . glucose blood (FREESTYLE LITE) test strip DX-E11.9  Check blood sugar 1 time a day 100 each 5  . Lancets (FREESTYLE) lancets Use as instructed test sugars 1 daily Dx code:E11.9 100 each 5  . meloxicam (MOBIC) 15 MG tablet Take once daily for 2 weeks, then 1/2-1 tab daily as needed. Can take with tylenol, do not take with ibuprofen or aleve. 30 tablet 2  . metFORMIN (GLUCOPHAGE-XR) 500 MG 24 hr tablet TAKE 1-2 TABLETS BY MOUTH TWICE A DAY FOR DIABETES 360 tablet 1  . nicotine (NICODERM CQ - DOSED IN MG/24 HOURS) 14 mg/24hr patch Place 1 patch (14 mg total) onto the skin daily. (Patient not taking: Reported on 10/23/2018) 30 patch 1  . pantoprazole (PROTONIX) 40 MG tablet Take 1 tablet (40 mg total) daily by mouth. 90 tablet 0   No  current facility-administered medications on file prior to visit.    Health Maintenance:  Immunization History  Administered Date(s) Administered  . Hepatitis B 06/24/2014  . Influenza Inj Mdck Quad With Preservative 02/08/2017, 02/09/2018  . Influenza Whole 01/17/2012  . Influenza, Seasonal, Injecte, Preservative Fre 02/09/2016  . Influenza-Unspecified 11/05/2018  . Pneumococcal Conjugate-13 12/28/2011  . Pneumococcal Polysaccharide-23 10/03/2012  . Tdap 10/04/2011    Tetanus: 09/2011 Pneumovax: 2014 Prevnar 13: 2013 Flu vaccine: 2020 Zostavax: N/A  Hep B: 2016 DEXA: Colonoscopy: 12/2013 Dr. Christella HartiganJacobs q 10 years EGD: Heart cath 04/2013  Allergies:  Allergies  Allergen Reactions  . Chantix [Varenicline]     Nausea  . Lopid [Gemfibrozil]     Nausea/weakness   Medical History:  Past Medical History:  Diagnosis Date  . Chest pain 05/16/2012  . Colon polyps   . DDD (degenerative disc disease)   . Diabetes mellitus without complication (HCC)   . Hepatitis C    treated for hepatatis but not sure what type  . Hiatal hernia   . Hypercholesteremia   . Marijuana use   . Osteoarthritis   . Sarcoid   . Thrombocytopenia (HCC)   . Tobacco abuse    SURGICAL HISTORY He  has a past surgical history that includes Appendectomy; HEAD TRAUMA (1975); and left heart catheterization with coronary angiogram (N/A, 05/17/2012). FAMILY HISTORY His family history includes Cancer in his father; Diabetes Mellitus II in his mother; Leukemia in his mother; Other in an other family member; Sarcoidosis in his paternal aunt and sister. SOCIAL HISTORY He  reports that he has been smoking cigarettes. He has a 15.00 pack-year smoking history.  He has never used smokeless tobacco. He reports current alcohol use of about 6.0 standard drinks of alcohol per week. He reports current drug use. Frequency: 2.00 times per week. Drug: Marijuana.  Review of Systems  Constitutional: Negative.  Negative for  chills, diaphoresis, fever, malaise/fatigue and weight loss.  HENT: Negative for congestion, ear discharge, ear pain, hearing loss, nosebleeds, sore throat and tinnitus.   Eyes: Negative.   Respiratory: Negative for cough, shortness of breath and stridor.   Cardiovascular: Negative.  Negative for leg swelling.  Gastrointestinal: Negative.   Genitourinary: Negative.   Musculoskeletal: Positive for back pain and joint pain. Negative for falls, myalgias and neck pain.  Skin: Negative.   Neurological: Negative.  Negative for weakness and headaches.  Psychiatric/Behavioral: Negative.      Physical Exam: Estimated body mass index is 31.83 kg/m as calculated from the following:   Height as of this encounter: 6' 0.5" (1.842 m).   Weight as of this encounter: 238 lb (108 kg). BP 130/62   Pulse 76   Temp (!) 97.1 F (36.2 C)   Resp 18   Ht 6' 0.5" (1.842 m)   Wt 238 lb (108 kg)   BMI 31.83 kg/m  General Appearance: Obese, in no apparent distress.  Eyes: PERRLA, EOMs, conjunctiva no swelling or erythema, normal fundi and vessels.  Sinuses: No Frontal/maxillary tenderness  ENT/Mouth: Ext aud canals clear,  Crowded mouth, normal light reflex with TMs without erythema, bulging. Good dentition. No erythema, swelling, or exudate on post pharynx. Tonsils not swollen or erythematous. Hearing normal.  Neck: Supple, thyroid normal. No bruits  Respiratory: Respiratory effort normal, CTAB,  without rales, rhonchi, or stridor.  Cardio: RRR without murmurs, rubs or gallops. Brisk peripheral pulses without edema.  Chest: symmetric, with normal excursions and percussion.  Abdomen: Soft, nontender, obese no guarding, rebound, hernias, masses, or organomegaly. .  Lymphatics: Non tender without lymphadenopathy.  Genitourinary: defer Musculoskeletal: Full ROM all peripheral extremities,5/5 strength, and antalgic gait,right ankle with decreased range of motion and tenderness diffusely at the ankle no  ecchymosis, no effusion and no ligamentous laxity. Neurological Exam: normal Vascular Exam: normal. Skin: Warm, dry without rashes, lesions, ecchymosis. Neuro: Cranial nerves intact, reflexes equal bilaterally. Normal muscle tone, no cerebellar symptoms. Psych: Awake and oriented X 3, normal affect, Insight and Judgment appropriate.   EKG: WNL no changes. Aorta scan defer  Vicie Mutters 9:06 AM [facility]

## 2019-02-20 ENCOUNTER — Encounter: Payer: Self-pay | Admitting: Physician Assistant

## 2019-02-20 ENCOUNTER — Other Ambulatory Visit: Payer: Self-pay

## 2019-02-20 ENCOUNTER — Ambulatory Visit (INDEPENDENT_AMBULATORY_CARE_PROVIDER_SITE_OTHER): Payer: 59 | Admitting: Physician Assistant

## 2019-02-20 VITALS — BP 130/62 | HR 76 | Temp 97.1°F | Resp 18 | Ht 72.5 in | Wt 238.0 lb

## 2019-02-20 DIAGNOSIS — I10 Essential (primary) hypertension: Secondary | ICD-10-CM

## 2019-02-20 DIAGNOSIS — F3341 Major depressive disorder, recurrent, in partial remission: Secondary | ICD-10-CM

## 2019-02-20 DIAGNOSIS — E785 Hyperlipidemia, unspecified: Secondary | ICD-10-CM

## 2019-02-20 DIAGNOSIS — Z0000 Encounter for general adult medical examination without abnormal findings: Secondary | ICD-10-CM | POA: Diagnosis not present

## 2019-02-20 DIAGNOSIS — Z0001 Encounter for general adult medical examination with abnormal findings: Secondary | ICD-10-CM

## 2019-02-20 DIAGNOSIS — E663 Overweight: Secondary | ICD-10-CM

## 2019-02-20 DIAGNOSIS — D696 Thrombocytopenia, unspecified: Secondary | ICD-10-CM

## 2019-02-20 DIAGNOSIS — M5136 Other intervertebral disc degeneration, lumbar region: Secondary | ICD-10-CM

## 2019-02-20 DIAGNOSIS — K219 Gastro-esophageal reflux disease without esophagitis: Secondary | ICD-10-CM

## 2019-02-20 DIAGNOSIS — D869 Sarcoidosis, unspecified: Secondary | ICD-10-CM

## 2019-02-20 DIAGNOSIS — K635 Polyp of colon: Secondary | ICD-10-CM

## 2019-02-20 DIAGNOSIS — Z136 Encounter for screening for cardiovascular disorders: Secondary | ICD-10-CM

## 2019-02-20 DIAGNOSIS — R7309 Other abnormal glucose: Secondary | ICD-10-CM

## 2019-02-20 DIAGNOSIS — M25561 Pain in right knee: Secondary | ICD-10-CM

## 2019-02-20 DIAGNOSIS — M2550 Pain in unspecified joint: Secondary | ICD-10-CM

## 2019-02-20 DIAGNOSIS — E291 Testicular hypofunction: Secondary | ICD-10-CM

## 2019-02-20 DIAGNOSIS — Z79899 Other long term (current) drug therapy: Secondary | ICD-10-CM

## 2019-02-20 DIAGNOSIS — Z125 Encounter for screening for malignant neoplasm of prostate: Secondary | ICD-10-CM

## 2019-02-20 DIAGNOSIS — Z720 Tobacco use: Secondary | ICD-10-CM

## 2019-02-20 DIAGNOSIS — K449 Diaphragmatic hernia without obstruction or gangrene: Secondary | ICD-10-CM

## 2019-02-20 DIAGNOSIS — Z130 Encounter for screening for diseases of the blood and blood-forming organs and certain disorders involving the immune mechanism: Secondary | ICD-10-CM

## 2019-02-20 DIAGNOSIS — E559 Vitamin D deficiency, unspecified: Secondary | ICD-10-CM

## 2019-02-20 DIAGNOSIS — B1920 Unspecified viral hepatitis C without hepatic coma: Secondary | ICD-10-CM

## 2019-02-20 MED ORDER — VITAMIN D (ERGOCALCIFEROL) 1.25 MG (50000 UNIT) PO CAPS: ORAL_CAPSULE | ORAL | 1 refills | Status: DC

## 2019-02-20 MED ORDER — MELOXICAM 15 MG PO TABS: ORAL_TABLET | ORAL | 2 refills | Status: DC

## 2019-02-20 NOTE — Patient Instructions (Addendum)
Mobic is an antiinflammatory It helps pain, can not take with aleve, or ibuprofen You can take tylenol (500mg ) or tylenol arthritis (650mg ) with the meloxicam/antiinflammatories. The max you can take of tylenol a day is 3000mg  daily, this is a max of 6 pills a day of the regular tyelnol (500mg ) or a max of 4 a day of the tylenol arthritis (650mg ) as long as no other medications you are taking contain tylenol.   Mobic can cause inflammation in your stomach and can cause ulcers or bleeding, this will look like black tarry stools Make sure you take your mobic with food Try not to take it daily, take AS needed Can take with pepcid  INFORMATION ABOUT YOUR XRAY  Can walk into [address] for an . They will have the order and take you back. You do not any paper work, I should get the result back today or tomorrow. This order is good for a year.  Can call [phone] to schedule an appointment if you wish.    VITAMIN D IS IMPORTANT  Vitamin D goal is between 60-80  Please make sure that you are taking your Vitamin D as directed.   It is very important as a natural anti-inflammatory   helping hair, skin, and nails, as well as reducing stroke and heart attack risk.   It helps your bones and helps with mood.  We want you on at least 50000 IU three x a week for 3 months  It also decreases numerous cancer risks so please take it as directed.   Low Vit D is associated with a 200-300% higher risk for CANCER   and 200-300% higher risk for HEART   ATTACK  &  STROKE.    .....................................  It is also associated with higher death rate at younger ages,   autoimmune diseases like Rheumatoid arthritis, Lupus, Multiple Sclerosis.     Also many other serious conditions, like depression, Alzheimer's  Dementia, infertility, muscle aches, fatigue, fibromyalgia - just to name a few.  +++++++++++++++++++  Can get liquid vitamin D from  OR here in  [place] at  [facility] alternatives [address] [place], [place] Or you can try [employer]   Cervicogenic Headache  A cervicogenic headache is a headache caused by a condition that affects the bones and tissues in your neck (cervical spine). In a cervicogenic headache, the pain moves from your neck to your head. Most cervicogenic headaches start in the upper part of the neck with the first three cervical bones (cervical vertebrae). A cervicogenic headache is diagnosed when a cause can be found in the cervical spine and other causes of headaches can be ruled out. What are the causes? The most common cause of this condition is a traumatic injury to the cervical spine, such as whiplash. Other causes include:  Arthritis.  Broken bone (fracture).  Infection.  Tumor. What are the signs or symptoms? The most common symptoms are neck and head pain. The pain is often located on one side. In some cases, there may be head pain without neck pain. Pain may be felt in the neck, back or side of the head, face, or behind the eyes. Other symptoms include:  Limited movement in the neck.  Arm or shoulder pain. How is this diagnosed? This condition may be diagnosed based on:  Your symptoms.  A physical exam.  An injection that blocks nerve signals (nerve block).  Imaging tests, such as: ? X-rays. ? CT scan. ?  MRI. How is this treated? Treatment for this condition may depend on the underlying condition. Treatment may include:  Medicines, such as: ? NSAIDs. ? Muscle relaxants.  Physical therapy.  Massage therapy.  Complementary therapies, such as: ? Biofeedback. ? Meditation. ? Acupuncture.  Nerve block injections.  Botulinum toxin injections. Your treatment plan may involve working with a pain management team that includes your primary health care provider, a pain management specialist, a neurologist, and a physical therapist. Follow these instructions at home:  Take  over-the-counter and prescription medicines only as told by your health care provider.  Do exercises at home as told by your physical therapist.  Return to your normal activities as told by your health care provider. Ask your health care provider what activities are safe for you. Avoid activities that trigger your headaches.  Maintain good neck support and posture at home and at work.  Keep all follow-up visits as told by your health care provider. This is important. Contact a health care provider if you have:  Headaches that are getting worse and happening more often.  Headaches with any of the following: ? Fever. ? Numbness. ? Weakness. ? Dizziness. ? Nausea or vomiting. Get help right away if:  You have a very sudden and severe headache. Summary  A cervicogenic headache is a headache caused by a condition that affects the bones and tissues in your cervical spine.  Your health care provider may diagnose this condition with a physical exam, a nerve block, and imaging tests.  Treatment may include medicine to reduce pain and inflammation, physical therapy, and nerve block injections.  Complementary therapies, such as acupuncture and meditation, may be added to other treatments.  Your treatment plan may involve working with a pain management team that includes your primary health care provider, a pain management specialist, a neurologist, and a physical therapist. This information is not intended to replace advice given to you by your health care provider. Make sure you discuss any questions you have with your health care provider. Document Released: 06/05/2003 Document Revised: 07/05/2018 Document Reviewed: 03/25/2017 Elsevier Patient Education  2020 [employer].

## 2019-02-21 LAB — PSA: PSA: 1.4 ng/mL (ref <=4.0)

## 2019-02-21 LAB — CBC WITH DIFFERENTIAL/PLATELET
Absolute Monocytes: 497 cells/uL (ref 200–950)
Basophils Absolute: 58 cells/uL (ref 0–200)
Basophils Relative: 0.8 %
Eosinophils Absolute: 389 cells/uL (ref 15–500)
Eosinophils Relative: 5.4 %
HCT: 44 % (ref 38.5–50.0)
Hemoglobin: 14.6 g/dL (ref 13.2–17.1)
Lymphs Abs: 1656 cells/uL (ref 850–3900)
MCH: 28.3 pg (ref 27.0–33.0)
MCHC: 33.2 g/dL (ref 32.0–36.0)
MCV: 85.4 fL (ref 80.0–100.0)
MPV: 10.3 fL (ref 7.5–12.5)
Monocytes Relative: 6.9 %
Neutro Abs: 4601 cells/uL (ref 1500–7800)
Neutrophils Relative %: 63.9 %
Platelets: 195 10*3/uL (ref 140–400)
RBC: 5.15 10*6/uL (ref 4.20–5.80)
RDW: 12.4 % (ref 11.0–15.0)
Total Lymphocyte: 23 %
WBC: 7.2 10*3/uL (ref 3.8–10.8)

## 2019-02-21 LAB — URINALYSIS, ROUTINE W REFLEX MICROSCOPIC
Bilirubin Urine: NEGATIVE
Hgb urine dipstick: NEGATIVE
Ketones, ur: NEGATIVE
Leukocytes,Ua: NEGATIVE
Nitrite: NEGATIVE
Protein, ur: NEGATIVE
Specific Gravity, Urine: 1.027 (ref 1.001–1.03)
pH: 6 (ref 5.0–8.0)

## 2019-02-21 LAB — HEMOGLOBIN A1C
Hgb A1c MFr Bld: 5.5 % of total Hgb (ref <5.7)
Mean Plasma Glucose: 111 (calc)
eAG (mmol/L): 6.2 (calc)

## 2019-02-21 LAB — COMPLETE METABOLIC PANEL WITH GFR
AG Ratio: 1.8 (calc) (ref 1.0–2.5)
ALT: 16 U/L (ref 9–46)
AST: 14 U/L (ref 10–35)
Albumin: 4.4 g/dL (ref 3.6–5.1)
Alkaline phosphatase (APISO): 74 U/L (ref 35–144)
BUN: 14 mg/dL (ref 7–25)
CO2: 22 mmol/L (ref 20–32)
Calcium: 9.6 mg/dL (ref 8.6–10.3)
Chloride: 110 mmol/L (ref 98–110)
Creat: 0.97 mg/dL (ref 0.70–1.25)
GFR, Est African American: 97 mL/min/{1.73_m2} (ref >=60)
GFR, Est Non African American: 84 mL/min/{1.73_m2} (ref >=60)
Globulin: 2.4 g/dL (calc) (ref 1.9–3.7)
Glucose, Bld: 111 mg/dL — ABNORMAL HIGH (ref 65–99)
Potassium: 4.2 mmol/L (ref 3.5–5.3)
Sodium: 143 mmol/L (ref 135–146)
Total Bilirubin: 0.4 mg/dL (ref 0.2–1.2)
Total Protein: 6.8 g/dL (ref 6.1–8.1)

## 2019-02-21 LAB — LIPID PANEL
Cholesterol: 151 mg/dL (ref <200)
HDL: 29 mg/dL — ABNORMAL LOW (ref >=40)
LDL Cholesterol (Calc): 91 mg/dL (calc)
Non-HDL Cholesterol (Calc): 122 mg/dL (calc) (ref <130)
Total CHOL/HDL Ratio: 5.2 (calc) — ABNORMAL HIGH (ref <5.0)
Triglycerides: 224 mg/dL — ABNORMAL HIGH (ref <150)

## 2019-02-21 LAB — URIC ACID: Uric Acid, Serum: 3.8 mg/dL — ABNORMAL LOW (ref 4.0–8.0)

## 2019-02-21 LAB — IRON, TOTAL/TOTAL IRON BINDING CAP
%SAT: 21 % (calc) (ref 20–48)
Iron: 86 ug/dL (ref 50–180)
TIBC: 408 mcg/dL (calc) (ref 250–425)

## 2019-02-21 LAB — TSH: TSH: 0.94 mIU/L (ref 0.40–4.50)

## 2019-02-21 LAB — MICROALBUMIN / CREATININE URINE RATIO
Creatinine, Urine: 155 mg/dL (ref 20–320)
Microalb Creat Ratio: 23 mcg/mg creat (ref <30)
Microalb, Ur: 3.5 mg/dL

## 2019-02-21 LAB — VITAMIN D 25 HYDROXY (VIT D DEFICIENCY, FRACTURES): Vit D, 25-Hydroxy: 22 ng/mL — ABNORMAL LOW (ref 30–100)

## 2019-02-21 LAB — MAGNESIUM: Magnesium: 1.9 mg/dL (ref 1.5–2.5)

## 2019-02-21 LAB — VITAMIN B12: Vitamin B-12: 531 pg/mL (ref 200–1100)

## 2019-02-21 LAB — TESTOSTERONE: Testosterone: 402 ng/dL (ref 250–827)

## 2019-04-30 ENCOUNTER — Other Ambulatory Visit: Payer: Self-pay

## 2019-04-30 DIAGNOSIS — Z720 Tobacco use: Secondary | ICD-10-CM

## 2019-04-30 MED ORDER — BUPROPION HCL ER (XL) 150 MG PO TB24: ORAL_TABLET | ORAL | 1 refills | Status: DC

## 2019-05-02 ENCOUNTER — Other Ambulatory Visit: Payer: Self-pay | Admitting: Adult Health

## 2019-05-29 ENCOUNTER — Ambulatory Visit: Payer: 59 | Admitting: Physician Assistant

## 2019-06-04 NOTE — Progress Notes (Signed)
3 MONTH FOLLOW UP  Assessment and Plan:  Breckan was seen today for follow-up.  Diagnoses and all orders for this visit:  Essential hypertension Monitor blood pressure at home; call if consistently over 130/80 Continue DASH diet.   Reminder to go to the ER if any CP, SOB, nausea, dizziness, severe HA, changes vision/speech, left arm numbness and tingling and jaw pain. -     COMPLETE METABOLIC PANEL WITH GFR -     CBC with Differential/Platelet  Gastroesophageal reflux disease with esophagitis Doing well at this time Diet discussed Monitor for triggers Avoid food with high acid content Avoid excessive cafeine Increase water intake' -     pantoprazole (PROTONIX) 40 MG tablet; Take 1 tablet (40 mg total) by mouth daily as needed.  Hyperlipidemia, unspecified hyperlipidemia type Continue medications: Discussed dietary and exercise modifications Low fat diet -     Lipid panel  Abnormal glucose Discussed dietary and exercise modifications ' Type 2 diabetes mellitus with hyperglycemia, without long-term current use of insulin (HCC) Continue medications: MetforminXL 500mg  two tablets BID with meas. Discussed general issues about diabetes pathophysiology and management. Education: Reviewed 'ABCs' of diabetes management (respective goals in parentheses):  A1C (<7), blood pressure (<130/80), and cholesterol (LDL <70) Dietary recommendations Encouraged aerobic exercise.  Discussed foot care, check daily Yearly retinal exam Dental exam every 6 months Monitor blood glucose, discussed goal for patient -     Hemoglobin A1c  Vitamin D deficiency -     VITAMIN D 25 Hydroxy (Vit-D Deficiency, Fractures)  Sarcoidosis Advised to stop smoking Has not gone for chest X-ray, encouraged follow up for this  Thrombocytopenia (HCC) Will continue to monitor -     CBC with Diff  Recurrent major depressive disorder, in partial remission (HCC) - continue medications, stress management  techniques discussed, increase water, good sleep hygiene discussed, increase exercise, and increase veggies.   Obesity (BMI 30.0-34.9) BMI 31 - long discussion about weight loss, diet, and exercise -recommended diet heavy in fruits and veggies and low in animal meats, cheeses, and dairy products  Vitamin D deficiency -     Vitamin D (25 hydroxy) -     Vitamin D, Ergocalciferol, (DRISDOL) 1.25 MG (50000 UT) CAPS capsule; 1 pill 3 days a week for vitamin d deficiency Discussed that def can worsen COVID outcomes, will do vitamin D RX 3 x a week for 3 months and recheck.    This visit 07-31-1990 of face to face interview, exam, chart review and moderate decision making was preformed. Discussed med's effects and SE's. Screening labs and tests as requested with regular follow-up as recommended. Future Appointments  Date Time Provider Department Center  02/20/2020  9:00 AM 02/22/2020, PA-C [facility] None    HPI Patient presents for 3 month follow up for HTN, HLD, DMIII, obesity and vitamin D deficiency.   He smokes and has sarcoidosis, followed with Dr. Quentin Mulling and has seen Dr. Shelle Iron in the past but it has been years, had visit with Dr. [place] in 02/2018.  Denies SOB but states that he is not moving around as much.  He continues to smoke, smokes a half a pack a day x 20 years, he states he can not handle the patches. He is on wellbutrin but states it is not better. he tried chantix but states he could not continue due to side effects.  He is not ready to quit at this tim.    His blood pressure has been controlled at home, today their  BP is BP: 122/72 He does not workout. He denies chest pain, shortness of breath, dizziness.  He is on cholesterol medication, fenofibrate and denies myalgias. His cholesterol is not at goal. The cholesterol last visit was:   Lab Results  Component Value Date   CHOL 151 02/20/2019   HDL 29 (L) 02/20/2019   LDLCALC 91 02/20/2019   TRIG 224 (H) 02/20/2019    CHOLHDL 5.2 (H) 02/20/2019    He has been working on diet and exercise for prediabetes, he is on metformin, and denies paresthesia of the feet, polydipsia and polyuria. Last A1C in the office was:  Lab Results  Component Value Date   HGBA1C 5.5 02/20/2019   Patient is on Vitamin D supplement.   Lab Results  Component Value Date   VD25OH 22 (L) 02/20/2019     Last PSA is  Lab Results  Component Value Date   PSA 1.4 02/20/2019   PSA 1.2 02/09/2018   PSA 1.5 02/08/2017   He has a history of hepatitis C, followed with Dr. Maurilio Lovely, treated on 04/22/2014, [facility].  Has had Hep B vaccine, no longer needs to follow up, will follow up here.   BMI is Body mass index is 31.7 kg/m., he is working on diet and exercise. Wt Readings from Last 3 Encounters:  06/05/19 237 lb (107.5 kg)  02/20/19 238 lb (108 kg)  10/23/18 238 lb (108 kg)     Current Medications:  Current Outpatient Medications on File Prior to Visit  Medication Sig Dispense Refill  . buPROPion (WELLBUTRIN XL) 150 MG 24 hr tablet TAKE 1 TABLET BY MOUTH EVERY DAY IN THE MORNING 90 tablet 1  . cyclobenzaprine (FLEXERIL) 10 MG tablet Take 1 tablet (10 mg total) by mouth at bedtime as needed for muscle spasms. 60 tablet 0  . famotidine (PEPCID) 40 MG tablet Take 1 tablet (40 mg total) by mouth every evening. 90 tablet 1  . fenofibrate micronized (LOFIBRA) 134 MG capsule TAKE 1 CAPSULE BY MOUTH BEFORE BREAKFAST 90 capsule 1  . glucose blood (FREESTYLE LITE) test strip [id number]  Check blood sugar 1 time a day 100 each 5  . Lancets (FREESTYLE) lancets Use as instructed test sugars 1 daily Dx code:E11.9 100 each 5  . meloxicam (MOBIC) 15 MG tablet Take once daily for 2 weeks, then 1/2-1 tab daily as needed. Can take with tylenol, do not take with ibuprofen or aleve. 30 tablet 2  . metFORMIN (GLUCOPHAGE-XR) 500 MG 24 hr tablet Take 2 tablets 2 x /day with Meals for Diabetes 360 tablet 3  . Vitamin D, Ergocalciferol, (DRISDOL) 1.25 MG  (50000 UT) CAPS capsule 1 pill 3 days a week for vitamin d deficiency 36 capsule 1  . glipiZIDE (GLUCOTROL) 5 MG tablet Take 1/2 tab daily with breakfast if fasting glucose is 130+. Do not take if not eating or having low blood sugars. Take a second 1/2 tab with dinner if fasting remains persistently above 130. (Patient not taking: Reported on 10/23/2018) 90 tablet 1  . pantoprazole (PROTONIX) 40 MG tablet Take 1 tablet (40 mg total) daily by mouth. 90 tablet 0   No current facility-administered medications on file prior to visit.   Health Maintenance:  Immunization History  Administered Date(s) Administered  . Hepatitis B 06/24/2014  . Influenza Inj Mdck Quad With Preservative 02/08/2017, 02/09/2018  . Influenza Whole 01/17/2012  . Influenza, Seasonal, Injecte, Preservative Fre 02/09/2016  . Influenza-Unspecified 11/05/2018  . Pneumococcal Conjugate-13 12/28/2011  . Pneumococcal Polysaccharide-23  10/03/2012  . Tdap 10/04/2011    Tetanus: 09/2011 Pneumovax: 2014 Prevnar 13: 2013 Flu vaccine: 2020 Zostavax/Shingrix Discussed with patient  COVID19 SARS 2: 1/2 on 06/10/19 Hep B: 2016 Colonoscopy: 12/2013 Dr. Christella Hartigan q 10 years Heart cath 04/2013  Allergies:  Allergies  Allergen Reactions  . Chantix [Varenicline]     Nausea  . Lopid [Gemfibrozil]     Nausea/weakness   Medical History:  Past Medical History:  Diagnosis Date  . Chest pain 05/16/2012  . Colon polyps   . DDD (degenerative disc disease)   . Diabetes mellitus without complication (HCC)   . Hepatitis C    treated for hepatatis but not sure what type  . Hiatal hernia   . Hypercholesteremia   . Marijuana use   . Osteoarthritis   . Sarcoid   . Thrombocytopenia (HCC)   . Tobacco abuse    SURGICAL HISTORY He  has a past surgical history that includes Appendectomy; HEAD TRAUMA (1975); and left heart catheterization with coronary angiogram (N/A, 05/17/2012). FAMILY HISTORY His family history includes Cancer in his  father; Diabetes Mellitus II in his mother; Leukemia in his mother; Other in an other family member; Sarcoidosis in his paternal aunt and sister. SOCIAL HISTORY He  reports that he has been smoking cigarettes. He has a 15.00 pack-year smoking history. He has never used smokeless tobacco. He reports current alcohol use of about 6.0 standard drinks of alcohol per week. He reports current drug use. Frequency: 2.00 times per week. Drug: Marijuana.  Review of Systems  Constitutional: Negative.  Negative for chills, diaphoresis, fever, malaise/fatigue and weight loss.  HENT: Negative for congestion, ear discharge, ear pain, hearing loss, nosebleeds, sore throat and tinnitus.   Eyes: Negative.   Respiratory: Negative for cough, shortness of breath and stridor.   Cardiovascular: Negative.  Negative for leg swelling.  Gastrointestinal: Negative.   Genitourinary: Negative.   Musculoskeletal: Negative for back pain, falls, joint pain, myalgias and neck pain.  Skin: Negative.   Neurological: Negative.  Negative for weakness and headaches.  Psychiatric/Behavioral: Negative.      Physical Exam: Estimated body mass index is 31.7 kg/m as calculated from the following:   Height as of 02/20/19: 6' 0.5" (1.842 m).   Weight as of this encounter: 237 lb (107.5 kg). BP 122/72   Pulse 94   Temp (!) 97.3 F (36.3 C)   Wt 237 lb (107.5 kg)   SpO2 96%   BMI 31.70 kg/m  General Appearance: Obese, in no apparent distress.  Eyes: PERRLA, EOMs, conjunctiva no swelling or erythema, normal fundi and vessels.   Neck: Supple, thyroid normal. No bruits  Respiratory: Respiratory effort normal, CTA-B,  without rales, rhonchi, or stridor.  Cardio: RRR without murmurs, rubs or gallops. Brisk peripheral pulses without edema.  Chest: symmetric, with normal excursions and percussion.  Abdomen: Soft, nontender, obese no guarding, rebound, hernias, masses, or organomegaly. .  Lymphatics: Non tender without  lymphadenopathy.  Genitourinary: defer Musculoskeletal: Full ROM all peripheral extremities,5/5 strength, and antalgic gait.  Neurological Exam: normal  Vascular Exam: normal. Skin: Warm, dry without rashes, lesions, ecchymosis. Neuro: Cranial nerves intact, reflexes equal bilaterally. Normal muscle tone, no cerebellar symptoms. Psych: Awake and oriented X 3, normal affect, Insight and Judgment appropriate.    Bella Kennedy Lyna Laningham 5:09 PM [facility]

## 2019-06-05 ENCOUNTER — Other Ambulatory Visit: Payer: Self-pay

## 2019-06-05 ENCOUNTER — Ambulatory Visit (INDEPENDENT_AMBULATORY_CARE_PROVIDER_SITE_OTHER): Payer: 59 | Admitting: Adult Health Nurse Practitioner

## 2019-06-05 ENCOUNTER — Encounter: Payer: Self-pay | Admitting: Adult Health Nurse Practitioner

## 2019-06-05 VITALS — BP 122/72 | HR 94 | Temp 97.3°F | Wt 237.0 lb

## 2019-06-05 DIAGNOSIS — E785 Hyperlipidemia, unspecified: Secondary | ICD-10-CM

## 2019-06-05 DIAGNOSIS — F3341 Major depressive disorder, recurrent, in partial remission: Secondary | ICD-10-CM

## 2019-06-05 DIAGNOSIS — I10 Essential (primary) hypertension: Secondary | ICD-10-CM

## 2019-06-05 DIAGNOSIS — D696 Thrombocytopenia, unspecified: Secondary | ICD-10-CM

## 2019-06-05 DIAGNOSIS — E1165 Type 2 diabetes mellitus with hyperglycemia: Secondary | ICD-10-CM

## 2019-06-05 DIAGNOSIS — K2100 Gastro-esophageal reflux disease with esophagitis, without bleeding: Secondary | ICD-10-CM | POA: Diagnosis not present

## 2019-06-05 DIAGNOSIS — R7309 Other abnormal glucose: Secondary | ICD-10-CM

## 2019-06-05 DIAGNOSIS — F17219 Nicotine dependence, cigarettes, with unspecified nicotine-induced disorders: Secondary | ICD-10-CM

## 2019-06-05 DIAGNOSIS — D869 Sarcoidosis, unspecified: Secondary | ICD-10-CM

## 2019-06-05 DIAGNOSIS — E559 Vitamin D deficiency, unspecified: Secondary | ICD-10-CM

## 2019-06-05 DIAGNOSIS — E66811 Obesity, class 1: Secondary | ICD-10-CM

## 2019-06-05 DIAGNOSIS — E669 Obesity, unspecified: Secondary | ICD-10-CM

## 2019-06-05 DIAGNOSIS — Z79899 Other long term (current) drug therapy: Secondary | ICD-10-CM

## 2019-06-05 MED ORDER — PANTOPRAZOLE SODIUM 40 MG PO TBEC: 40.0000 mg | DELAYED_RELEASE_TABLET | Freq: Every day | ORAL | 0 refills | Status: AC | PRN

## 2019-06-05 NOTE — Patient Instructions (Signed)
We will contact you in 1-3 days with your lab results.  Continue checking blood glucose levels.    Vit D  & Vit C 1,000 mg   are recommended to help protect  against the Covid-19 and other Corona viruses.    Also it's recommended  to take  Zinc 50 mg  to help  protect against the Covid-19   and best place to get  is also on [email or web address]  and don't pay more than 6-8 cents /pill !  =============================== Coronavirus (COVID-19) Are you at risk?  Are you at risk for the Coronavirus (COVID-19)?  To be considered HIGH RISK for Coronavirus (COVID-19), you have to meet the following criteria:  . Traveled to [place], [place], [place], [place] or [place]; or in the [place] to [place], [place], [place]  . or [place]; and have fever, cough, and shortness of breath within the last 2 weeks of travel OR . Been in close contact with a person diagnosed with COVID-19 within the last 2 weeks and have  . fever, cough,and shortness of breath .  . IF YOU DO NOT MEET THESE CRITERIA, YOU ARE CONSIDERED LOW RISK FOR COVID-19.  What to do if you are HIGH RISK for COVID-19?  [name] If you are having a medical emergency, call 911. . Seek medical care right away. Before you go to a doctor's office, urgent care or emergency department, .  call ahead and tell them about your recent travel, contact with someone diagnosed with COVID-19  .  and your symptoms.  . You should receive instructions from your physician's office regarding next steps of care.  . When you arrive at healthcare provider, tell the healthcare staff immediately you have returned from  . visiting [place], [place], [place], [place] or [place]; or traveled in the [place] to [place], [place],  . [place] or [place] in the last two weeks or you have been in close contact with a person diagnosed with  . COVID-19 in the last 2 weeks.   . Tell the health care staff about your symptoms: fever, cough and shortness of  breath. . After you have been seen by a medical provider, you will be either: o Tested for (COVID-19) and discharged home on quarantine except to seek medical care if  o symptoms worsen, and asked to  - Stay home and avoid contact with others until you get your results (4-5 days)  - Avoid travel on public transportation if possible (such as bus, train, or airplane) or o Sent to the Emergency Department by EMS for evaluation, COVID-19 testing  and  o possible admission depending on your condition and test results.  What to do if you are LOW RISK for COVID-19?  Reduce your risk of any infection by using the same precautions used for avoiding the common cold or flu:  [name] Wash your hands often with soap and warm water for at least 20 seconds.  If soap and water are not readily available,  . use an alcohol-based hand sanitizer with at least 60% alcohol.  . If coughing or sneezing, cover your mouth and nose by coughing or sneezing into the elbow areas of your shirt or coat, .  into a tissue or into your sleeve (not your hands). . Avoid shaking hands with others and consider head nods or verbal greetings only. . Avoid touching your eyes, nose, or mouth with unwashed hands.  . Avoid close contact with people  who are sick. . Avoid places or events with large numbers of people in one location, like concerts or sporting events. . Carefully consider travel plans you have or are making. . If you are planning any travel outside or inside the [place], visit the CDC's Travelers' Health webpage for the latest health notices. . If you have some symptoms but not all symptoms, continue to monitor at home and seek medical attention  . if your symptoms worsen. . If you are having a medical emergency, call 911. >>>>>>>>>>>>>>>>>>>>>>>>>>>> Preventive Care for Adults  A healthy lifestyle and preventive care can promote health and wellness. Preventive health guidelines for men include the following key practices:  A  routine yearly physical is a good way to check with your health care provider about your health and preventative screening. It is a chance to share any concerns and updates on your health and to receive a thorough exam.  Visit your dentist for a routine exam and preventative care every 6 months. Brush your teeth twice a day and floss once a day. Good oral hygiene prevents tooth decay and gum disease.  The frequency of eye exams is based on your age, health, family medical history, use of contact lenses, and other factors. Follow your health care provider's recommendations for frequency of eye exams.  Eat a healthy diet. Foods such as vegetables, fruits, whole grains, low-fat dairy products, and lean protein foods contain the nutrients you need without too many calories. Decrease your intake of foods high in solid fats, added sugars, and salt. Eat the right amount of calories for you. Get information about a proper diet from your health care provider, if necessary.  Regular physical exercise is one of the most important things you can do for your health. Most adults should get at least 150 minutes of moderate-intensity exercise (any activity that increases your heart rate and causes you to sweat) each week. In addition, most adults need muscle-strengthening exercises on 2 or more days a week.  Maintain a healthy weight. The body mass index (BMI) is a screening tool to identify possible weight problems. It provides an estimate of body fat based on height and weight. Your health care provider can find your BMI and can help you achieve or maintain a healthy weight. For adults 20 years and older:  A BMI below 18.5 is considered underweight.  A BMI of 18.5 to 24.9 is normal.  A BMI of 25 to 29.9 is considered overweight.  A BMI of 30 and above is considered obese.  Maintain normal blood lipids and cholesterol levels by exercising and minimizing your intake of saturated fat. Eat a balanced diet with  plenty of fruit and vegetables. Blood tests for lipids and cholesterol should begin at age 41 and be repeated every 5 years. If your lipid or cholesterol levels are high, you are over 50, or you are at high risk for heart disease, you may need your cholesterol levels checked more frequently. Ongoing high lipid and cholesterol levels should be treated with medicines if diet and exercise are not working.  If you smoke, find out from your health care provider how to quit. If you do not use tobacco, do not start.  Lung cancer screening is recommended for adults aged 55-80 years who are at high risk for developing lung cancer because of a history of smoking. A yearly low-dose CT scan of the lungs is recommended for people who have at least a 30-pack-year history of smoking and are  a current smoker or have quit within the past 15 years. A pack year of smoking is smoking an average of 1 pack of cigarettes a day for 1 year (for example: 1 pack a day for 30 years or 2 packs a day for 15 years). Yearly screening should continue until the smoker has stopped smoking for at least 15 years. Yearly screening should be stopped for people who develop a health problem that would prevent them from having lung cancer treatment.  If you choose to drink alcohol, do not have more than 2 drinks per day. One drink is considered to be 12 ounces (355 mL) of beer, 5 ounces (148 mL) of wine, or 1.5 ounces (44 mL) of liquor.  Avoid use of street drugs. Do not share needles with anyone. Ask for help if you need support or instructions about stopping the use of drugs.  High blood pressure causes heart disease and increases the risk of stroke. Your blood pressure should be checked at least every 1-2 years. Ongoing high blood pressure should be treated with medicines, if weight loss and exercise are not effective.  If you are 42-7 years old, ask your health care provider if you should take aspirin to prevent heart disease.  Diabetes  screening involves taking a blood sample to check your fasting blood sugar level. This should be done once every 3 years, after age 61, if you are within normal weight and without risk factors for diabetes. Testing should be considered at a younger age or be carried out more frequently if you are overweight and have at least 1 risk factor for diabetes.  Colorectal cancer can be detected and often prevented. Most routine colorectal cancer screening begins at the age of 22 and continues through age 70. However, your health care provider may recommend screening at an earlier age if you have risk factors for colon cancer. On a yearly basis, your health care provider may provide home test kits to check for hidden blood in the stool. Use of a small camera at the end of a tube to directly examine the colon (sigmoidoscopy or colonoscopy) can detect the earliest forms of colorectal cancer. Talk to your health care provider about this at age 35, when routine screening begins. Direct exam of the colon should be repeated every 5-10 years through age 56, unless early forms of precancerous polyps or small growths are found.   Talk with your health care provider about prostate cancer screening.  Testicular cancer screening isrecommended for adult males. Screening includes self-exam, a health care provider exam, and other screening tests. Consult with your health care provider about any symptoms you have or any concerns you have about testicular cancer.  Use sunscreen. Apply sunscreen liberally and repeatedly throughout the day. You should seek shade when your shadow is shorter than you. Protect yourself by wearing long sleeves, pants, a wide-brimmed hat, and sunglasses year round, whenever you are outdoors.  Once a month, do a whole-body skin exam, using a mirror to look at the skin on your back. Tell your health care provider about new moles, moles that have irregular borders, moles that are larger than a pencil eraser,  or moles that have changed in shape or color.  Stay current with required vaccines (immunizations).  Influenza vaccine. All adults should be immunized every year.  Tetanus, diphtheria, and acellular pertussis (Td, Tdap) vaccine. An adult who has not previously received Tdap or who does not know his vaccine status should receive 1 dose  of Tdap. This initial dose should be followed by tetanus and diphtheria toxoids (Td) booster doses every 10 years. Adults with an unknown or incomplete history of completing a 3-dose immunization series with Td-containing vaccines should begin or complete a primary immunization series including a Tdap dose. Adults should receive a Td booster every 10 years.  Varicella vaccine. An adult without evidence of immunity to varicella should receive 2 doses or a second dose if he has previously received 1 dose.  Human papillomavirus (HPV) vaccine. Males aged 13-21 years who have not received the vaccine previously should receive the 3-dose series. Males aged 22-26 years may be immunized. Immunization is recommended through the age of 31 years for any male who has sex with males and did not get any or all doses earlier. Immunization is recommended for any person with an immunocompromised condition through the age of 26 years if he did not get any or all doses earlier. During the 3-dose series, the second dose should be obtained 4-8 weeks after the first dose. The third dose should be obtained 24 weeks after the first dose and 16 weeks after the second dose.  Zoster vaccine. One dose is recommended for adults aged 79 years or older unless certain conditions are present.    PREVNAR  - Pneumococcal 13-valent conjugate (PCV13) vaccine. When indicated, a person who is uncertain of his immunization history and has no record of immunization should receive the PCV13 vaccine. An adult aged 1 years or older who has certain medical conditions and has not been previously immunized should  receive 1 dose of PCV13 vaccine. This PCV13 should be followed with a dose of pneumococcal polysaccharide (PPSV23) vaccine. The PPSV23 vaccine dose should be obtained at least 1 r more year(s) after the dose of PCV13 vaccine. An adult aged 80 years or older who has certain medical conditions and previously received 1 or more doses of PPSV23 vaccine should receive 1 dose of PCV13. The PCV13 vaccine dose should be obtained 1 or more years after the last PPSV23 vaccine dose.    PNEUMOVAX - Pneumococcal polysaccharide (PPSV23) vaccine. When PCV13 is also indicated, PCV13 should be obtained first. All adults aged 9 years and older should be immunized. An adult younger than age 59 years who has certain medical conditions should be immunized. Any person who resides in a nursing home or long-term care facility should be immunized. An adult smoker should be immunized. People with an immunocompromised condition and certain other conditions should receive both PCV13 and PPSV23 vaccines. People with human immunodeficiency virus (HIV) infection should be immunized as soon as possible after diagnosis. Immunization during chemotherapy or radiation therapy should be avoided. Routine use of PPSV23 vaccine is not recommended for American Indians, [address], or people younger than 65 years unless there are medical conditions that require PPSV23 vaccine. When indicated, people who have unknown immunization and have no record of immunization should receive PPSV23 vaccine. One-time revaccination 5 years after the first dose of PPSV23 is recommended for people aged 19-64 years who have chronic kidney failure, nephrotic syndrome, asplenia, or immunocompromised conditions. People who received 1-2 doses of PPSV23 before age 22 years should receive another dose of PPSV23 vaccine at age 29 years or later if at least 5 years have passed since the previous dose. Doses of PPSV23 are not needed for people immunized with PPSV23 at or  after age 27 years.    Hepatitis A vaccine. Adults who wish to be protected from this disease, have  certain high-risk conditions, work with hepatitis A-infected animals, work in hepatitis A research labs, or travel to or work in countries with a high rate of hepatitis A should be immunized. Adults who were previously unvaccinated and who anticipate close contact with an international adoptee during the first 60 days after arrival in the [place] from a country with a high rate of hepatitis A should be immunized.    Hepatitis B vaccine. Adults should be immunized if they wish to be protected from this disease, have certain high-risk conditions, may be exposed to blood or other infectious body fluids, are household contacts or sex partners of hepatitis B positive people, are clients or workers in certain care facilities, or travel to or work in countries with a high rate of hepatitis B.   Preventive Service / Frequency   Ages 39 to 15  Blood pressure check.  Lipid and cholesterol check  Lung cancer screening. / Every year if you are aged 55-80 years and have a 30-pack-year history of smoking and currently smoke or have quit within the past 15 years. Yearly screening is stopped once you have quit smoking for at least 15 years or develop a health problem that would prevent you from having lung cancer treatment.  Fecal occult blood test (FOBT) of stool. / Every year beginning at age 68 and continuing until age 21. You may not have to do this test if you get a colonoscopy every 10 years.  Flexible sigmoidoscopy** or colonoscopy.** / Every 5 years for a flexible sigmoidoscopy or every 10 years for a colonoscopy beginning at age 72 and continuing until age 1. Screening for abdominal aortic aneurysm (AAA)  by ultrasound is recommended for people who have history of high blood pressure or who are current or former smokers. +++++++++++ Recommend Adult Low Dose Aspirin or  coated  Aspirin 81 mg  daily  To reduce risk of Colon Cancer 40 %,  Skin Cancer 26 % ,  Malignant Melanoma 46%  and  Pancreatic cancer 60% ++++++++++++++++++++ Vitamin D goal  is between 70-100.  Please make sure that you are taking your Vitamin D as directed.  It is very important as a natural anti-inflammatory  helping hair, skin, and nails, as well as reducing stroke and heart attack risk.  It helps your bones and helps with mood. It also decreases numerous cancer risks so please take it as directed.  Low Vit D is associated with a 200-300% higher risk for CANCER  and 200-300% higher risk for HEART   ATTACK  &  STROKE.   .....................................[name] It is also associated with higher death rate at younger ages,  autoimmune diseases like Rheumatoid arthritis, Lupus, Multiple Sclerosis.    Also many other serious conditions, like depression, Alzheimer's Dementia, infertility, muscle aches, fatigue, fibromyalgia - just to name a few. +++++++++++++++++++++ Recommend the book "The END of DIETING" by Dr Monico Hoar  & the book "The END of DIABETES " by Dr Monico Hoar At [email or web address] - get book & Audio CD's    Being diabetic has a  300% increased risk for heart attack, stroke, cancer, and alzheimer- type vascular dementia. It is very important that you work harder with diet by avoiding all foods that are white. Avoid white rice (brown & wild rice is OK), white potatoes (sweetpotatoes in moderation is OK), White bread or wheat bread or anything made out of white flour like bagels, donuts, rolls, buns, biscuits, cakes, pastries, cookies, pizza crust, and pasta (made from  white flour & egg whites) - vegetarian pasta or spinach or wheat pasta is OK. Multigrain breads like Arnold's or [employer], or multigrain sandwich thins or flatbreads.  Diet, exercise and weight loss can reverse and cure diabetes in the early stages.  Diet, exercise and weight loss is very important in the control and prevention of  complications of diabetes which affects every system in your body, ie. Brain - dementia/stroke, eyes - glaucoma/blindness, heart - heart attack/heart failure, kidneys - dialysis, stomach - gastric paralysis, intestines - malabsorption, nerves - severe painful neuritis, circulation - gangrene & loss of a leg(s), and finally cancer and Alzheimers.    I recommend avoid fried & greasy foods,  sweets/candy, white rice (brown or wild rice or Quinoa is OK), white potatoes (sweet potatoes are OK) - anything made from white flour - bagels, doughnuts, rolls, buns, biscuits,white and wheat breads, pizza crust and traditional pasta made of white flour & egg white(vegetarian pasta or spinach or wheat pasta is OK).  Multi-grain bread is OK - like multi-grain flat bread or sandwich thins. Avoid alcohol in excess. Exercise is also important.    Eat all the vegetables you want - avoid meat, especially red meat and dairy - especially cheese.  Cheese is the most concentrated form of trans-fats which is the worst thing to clog up our arteries. Veggie cheese is OK which can be found in the fresh produce section at [employer] or [employer] or [employer]  ++++++++++++++++++++++ DASH Eating Plan  DASH stands for "Dietary Approaches to Stop Hypertension."   The DASH eating plan is a healthy eating plan that has been shown to reduce high blood pressure (hypertension). Additional health benefits may include reducing the risk of type 2 diabetes mellitus, heart disease, and stroke. The DASH eating plan may also help with weight loss. WHAT DO I NEED TO KNOW ABOUT THE DASH EATING PLAN? For the DASH eating plan, you will follow these general guidelines:  Choose foods with a percent daily value for sodium of less than 5% (as listed on the food label).  Use salt-free seasonings or herbs instead of table salt or sea salt.  Check with your health care provider or pharmacist before using salt substitutes.  Eat lower-sodium  products, often labeled as "lower sodium" or "no salt added."  Eat fresh foods.  Eat more vegetables, fruits, and low-fat dairy products.  Choose whole grains. Look for the word "whole" as the first word in the ingredient list.  Choose fish   Limit sweets, desserts, sugars, and sugary drinks.  Choose heart-healthy fats.  Eat veggie cheese   Eat more home-cooked food and less restaurant, buffet, and fast food.  Limit fried foods.  Cook foods using methods other than frying.  Limit canned vegetables. If you do use them, rinse them well to decrease the sodium.  When eating at a restaurant, ask that your food be prepared with less salt, or no salt if possible.                      WHAT FOODS CAN I EAT? Read Dr Fara Olden Fuhrman's books on The End of Dieting & The End of Diabetes  Grains Whole grain or whole wheat bread. Brown rice. Whole grain or whole wheat pasta. Quinoa, bulgur, and whole grain cereals. Low-sodium cereals. Corn or whole wheat flour tortillas. Whole grain cornbread. Whole grain crackers. Low-sodium crackers.  Vegetables Fresh or frozen vegetables (raw, steamed, roasted, or grilled). Low-sodium or reduced-sodium  tomato and vegetable juices. Low-sodium or reduced-sodium tomato sauce and paste. Low-sodium or reduced-sodium canned vegetables.   Fruits All fresh, canned (in natural juice), or frozen fruits.  Protein Products  All fish and seafood.  Dried beans, peas, or lentils. Unsalted nuts and seeds. Unsalted canned beans.  Dairy Low-fat dairy products, such as skim or 1% milk, 2% or reduced-fat cheeses, low-fat ricotta or cottage cheese, or plain low-fat yogurt. Low-sodium or reduced-sodium cheeses.  Fats and Oils Tub margarines without trans fats. Light or reduced-fat mayonnaise and salad dressings (reduced sodium). Avocado. Safflower, olive, or canola oils. Natural peanut or almond butter.  Other Unsalted popcorn and pretzels. The items listed above may not  be a complete list of recommended foods or beverages. Contact your dietitian for more options.  +++++++++++++++++++  WHAT FOODS ARE NOT RECOMMENDED? Grains/ White flour or wheat flour White bread. White pasta. White rice. Refined cornbread. Bagels and croissants. Crackers that contain trans fat.  Vegetables  Creamed or fried vegetables. Vegetables in a . Regular canned vegetables. Regular canned tomato sauce and paste. Regular tomato and vegetable juices.  Fruits Dried fruits. Canned fruit in light or heavy syrup. Fruit juice.  Meat and Other Protein Products Meat in general - RED meat & White meat.  Fatty cuts of meat. Ribs, chicken wings, all processed meats as bacon, sausage, bologna, salami, fatback, hot dogs, bratwurst and packaged luncheon meats.  Dairy Whole or 2% milk, cream, half-and-half, and cream cheese. Whole-fat or sweetened yogurt. Full-fat cheeses or blue cheese. Non-dairy creamers and whipped toppings. Processed cheese, cheese spreads, or cheese curds.  Condiments Onion and garlic salt, seasoned salt, table salt, and sea salt. Canned and packaged gravies. Worcestershire sauce. Tartar sauce. Barbecue sauce. Teriyaki sauce. Soy sauce, including reduced sodium. Steak sauce. Fish sauce. Oyster sauce. Cocktail sauce. Horseradish. Ketchup and mustard. Meat flavorings and tenderizers. Bouillon cubes. Hot sauce. Tabasco sauce. Marinades. Taco seasonings. Relishes.  Fats and Oils Butter, stick margarine, lard, shortening and bacon fat. Coconut, palm kernel, or palm oils. Regular salad dressings.  Pickles and olives. Salted popcorn and pretzels.  The items listed above may not be a complete list of foods and beverages to avoid.

## 2019-06-06 LAB — COMPLETE METABOLIC PANEL WITH GFR
AG Ratio: 1.7 (calc) (ref 1.0–2.5)
ALT: 19 U/L (ref 9–46)
AST: 17 U/L (ref 10–35)
Albumin: 4.4 g/dL (ref 3.6–5.1)
Alkaline phosphatase (APISO): 78 U/L (ref 35–144)
BUN: 13 mg/dL (ref 7–25)
CO2: 27 mmol/L (ref 20–32)
Calcium: 9.9 mg/dL (ref 8.6–10.3)
Chloride: 107 mmol/L (ref 98–110)
Creat: 1.08 mg/dL (ref 0.70–1.25)
GFR, Est African American: 85 mL/min/{1.73_m2} (ref >=60)
GFR, Est Non African American: 74 mL/min/{1.73_m2} (ref >=60)
Globulin: 2.6 g/dL (calc) (ref 1.9–3.7)
Glucose, Bld: 87 mg/dL (ref 65–99)
Potassium: 4.5 mmol/L (ref 3.5–5.3)
Sodium: 140 mmol/L (ref 135–146)
Total Bilirubin: 0.5 mg/dL (ref 0.2–1.2)
Total Protein: 7 g/dL (ref 6.1–8.1)

## 2019-06-06 LAB — LIPID PANEL
Cholesterol: 180 mg/dL (ref <200)
HDL: 29 mg/dL — ABNORMAL LOW (ref >=40)
LDL Cholesterol (Calc): 109 mg/dL (calc) — ABNORMAL HIGH
Non-HDL Cholesterol (Calc): 151 mg/dL (calc) — ABNORMAL HIGH (ref <130)
Total CHOL/HDL Ratio: 6.2 (calc) — ABNORMAL HIGH (ref <5.0)
Triglycerides: 316 mg/dL — ABNORMAL HIGH (ref <150)

## 2019-06-06 LAB — CBC WITH DIFFERENTIAL/PLATELET
Absolute Monocytes: 748 cells/uL (ref 200–950)
Basophils Absolute: 70 cells/uL (ref 0–200)
Basophils Relative: 0.8 %
Eosinophils Absolute: 422 cells/uL (ref 15–500)
Eosinophils Relative: 4.8 %
HCT: 46.7 % (ref 38.5–50.0)
Hemoglobin: 15.2 g/dL (ref 13.2–17.1)
Lymphs Abs: 2244 cells/uL (ref 850–3900)
MCH: 28.3 pg (ref 27.0–33.0)
MCHC: 32.5 g/dL (ref 32.0–36.0)
MCV: 86.8 fL (ref 80.0–100.0)
MPV: 10.1 fL (ref 7.5–12.5)
Monocytes Relative: 8.5 %
Neutro Abs: 5315 cells/uL (ref 1500–7800)
Neutrophils Relative %: 60.4 %
Platelets: 270 10*3/uL (ref 140–400)
RBC: 5.38 10*6/uL (ref 4.20–5.80)
RDW: 12.8 % (ref 11.0–15.0)
Total Lymphocyte: 25.5 %
WBC: 8.8 10*3/uL (ref 3.8–10.8)

## 2019-06-06 LAB — HEMOGLOBIN A1C
Hgb A1c MFr Bld: 5.7 % of total Hgb — ABNORMAL HIGH (ref <5.7)
Mean Plasma Glucose: 117 (calc)
eAG (mmol/L): 6.5 (calc)

## 2019-06-06 LAB — VITAMIN D 25 HYDROXY (VIT D DEFICIENCY, FRACTURES): Vit D, 25-Hydroxy: 52 ng/mL (ref 30–100)

## 2019-06-07 ENCOUNTER — Other Ambulatory Visit: Payer: Self-pay | Admitting: Adult Health Nurse Practitioner

## 2019-06-07 DIAGNOSIS — E559 Vitamin D deficiency, unspecified: Secondary | ICD-10-CM

## 2019-06-07 DIAGNOSIS — E785 Hyperlipidemia, unspecified: Secondary | ICD-10-CM

## 2019-06-07 DIAGNOSIS — Z0001 Encounter for general adult medical examination with abnormal findings: Secondary | ICD-10-CM

## 2019-06-07 MED ORDER — VITAMIN D (ERGOCALCIFEROL) 1.25 MG (50000 UNIT) PO CAPS: ORAL_CAPSULE | ORAL | 1 refills | Status: DC

## 2019-06-07 MED ORDER — FENOFIBRATE MICRONIZED 200 MG PO CAPS: 200.0000 mg | ORAL_CAPSULE | Freq: Every day | ORAL | 1 refills | Status: DC

## 2019-06-08 ENCOUNTER — Other Ambulatory Visit: Payer: Self-pay | Admitting: Physician Assistant

## 2019-06-08 DIAGNOSIS — Z0001 Encounter for general adult medical examination with abnormal findings: Secondary | ICD-10-CM

## 2019-06-08 DIAGNOSIS — E785 Hyperlipidemia, unspecified: Secondary | ICD-10-CM

## 2019-06-11 ENCOUNTER — Encounter: Payer: Self-pay | Admitting: Adult Health Nurse Practitioner

## 2019-06-19 ENCOUNTER — Other Ambulatory Visit: Payer: Self-pay | Admitting: Physician Assistant

## 2019-06-19 DIAGNOSIS — E559 Vitamin D deficiency, unspecified: Secondary | ICD-10-CM

## 2019-07-30 ENCOUNTER — Other Ambulatory Visit: Payer: Self-pay | Admitting: *Deleted

## 2019-07-30 MED ORDER — GLUCOSE BLOOD VI STRP: ORAL_STRIP | 12 refills | Status: DC

## 2019-07-30 MED ORDER — ONETOUCH DELICA PLUS LANCET33G MISC: 3 refills | Status: DC

## 2019-09-18 ENCOUNTER — Ambulatory Visit (INDEPENDENT_AMBULATORY_CARE_PROVIDER_SITE_OTHER): Payer: 59 | Admitting: Adult Health

## 2019-09-18 ENCOUNTER — Encounter: Payer: Self-pay | Admitting: Adult Health

## 2019-09-18 ENCOUNTER — Other Ambulatory Visit: Payer: Self-pay

## 2019-09-18 VITALS — BP 116/74 | HR 88 | Temp 97.2°F | Resp 16 | Ht 72.5 in | Wt 245.0 lb

## 2019-09-18 DIAGNOSIS — L309 Dermatitis, unspecified: Secondary | ICD-10-CM

## 2019-09-18 MED ORDER — TRIAMCINOLONE ACETONIDE 0.1 % EX OINT: 1.0000 "application " | TOPICAL_OINTMENT | Freq: Two times a day (BID) | CUTANEOUS | 1 refills | Status: DC

## 2019-09-18 NOTE — Patient Instructions (Signed)
Triamcinolone skin cream, ointment, lotion, or aerosol What is this medicine? TRIAMCINOLONE (trye am SIN oh lone) is a corticosteroid. It is used on the skin to reduce swelling, redness, itching, and allergic reactions. This medicine may be used for other purposes; ask your health care provider or pharmacist if you have questions. COMMON BRAND NAME(S): Aristocort, Aristocort A, Aristocort HP, Cinalog, Cinolar, DERMASORB TA Complete, Flutex, Kenalog, Pediaderm TA, Sila III, SP Rx 228, Triacet, Trianex, Triderm What should I tell my health care provider before I take this medicine? They need to know if you have any of these conditions:  any active infection  large areas of burned or damaged skin  skin wasting or thinning  an unusual or allergic reaction to triamcinolone, corticosteroids, other medicines, foods, dyes, or preservatives  pregnant or trying to get pregnant  breast-feeding How should I use this medicine? This medicine is for external use only. Do not take by mouth. Follow the directions on the prescription label. Wash your hands before and after use. Apply a thin film of medicine to the affected area. Do not cover with a bandage or dressing unless your doctor or health care professional tells you to. Do not use on healthy skin or over large areas of skin. Do not get this medicine in your eyes. If you do, rinse out with plenty of cool tap water. It is important not to use more medicine than prescribed. Do not use your medicine more often than directed. Talk to your pediatrician regarding the use of this medicine in children. Special care may be needed. Elderly patients are more likely to have damaged skin through aging, and this may increase side effects. This medicine should only be used for brief periods and infrequently in older patients. Overdosage: If you think you have taken too much of this medicine contact a poison control center or emergency room at once. NOTE: This medicine  is only for you. Do not share this medicine with others. What if I miss a dose? If you miss a dose, use it as soon as you can. If it is almost time for your next dose, use only that dose. Do not use double or extra doses. What may interact with this medicine? Interactions are not expected. This list may not describe all possible interactions. Give your health care provider a list of all the medicines, herbs, non-prescription drugs, or dietary supplements you use. Also tell them if you smoke, drink alcohol, or use illegal drugs. Some items may interact with your medicine. What should I watch for while using this medicine? Tell your doctor or health care professional if your symptoms do not start to get better within one week. Do not use for more than 14 days. Do not use on healthy skin or over large areas of skin. Tell your doctor or health care professional if you are exposed to anyone with measles or chickenpox, or if you develop sores or blisters that do not heal properly. Do not use an airtight bandage to cover the affected area unless your doctor or health care professional tells you to. If you are to cover the area, follow the instructions carefully. Covering the area where the medicine is applied can increase the amount that passes through the skin and increases the risk of side effects. If treating the diaper area of a child, avoid covering the treated area with tight-fitting diapers or plastic pants. This may increase the amount of medicine that passes through the skin and increase the risk   of serious side effects. What side effects may I notice from receiving this medicine? Side effects that you should report to your doctor or health care professional as soon as possible:  burning or itching of the skin  dark red spots on the skin  infection  painful, red, pus filled blisters in hair follicles  thinning of the skin, sunburn more likely especially on the face Side effects that usually  do not require medical attention (report to your doctor or health care professional if they continue or are bothersome):  dry skin, irritation  unusual increased growth of hair on the face or body This list may not describe all possible side effects. Call your doctor for medical advice about side effects. You may report side effects to FDA at 1-800-FDA-1088. Where should I keep my medicine? Keep out of the reach of children. Store at room temperature between 15 and 30 degrees C (59 and 86 degrees F). Do not freeze. Throw away any unused medicine after the expiration date. NOTE: This sheet is a summary. It may not cover all possible information. If you have questions about this medicine, talk to your doctor, pharmacist, or health care provider.  2020 Elsevier/Gold Standard (2017-12-15 10:50:30)  

## 2019-09-18 NOTE — Progress Notes (Signed)
Assessment and Plan:  Jerry Stewart was seen today for rash.  Diagnoses and all orders for this visit:  Dermatitis Nonspecific, 2 week rash suggestive of dermatitis, not suggestive of infectious etiology Denies new meds/topicals/detergents, hygiene reviewed Will try topical triamcinolone BID x 1-2 weeks Follow up if not resolving or with any new sx -     triamcinolone ointment (KENALOG) 0.1 %; Apply 1 application topically 2 (two) times daily. For 1-2 weeks until rash is resolved.  Further disposition pending results of labs. Discussed med's effects and SE's.   Over 15 minutes of exam, counseling, chart review, and critical decision making was performed.   Future Appointments  Date Time Provider [place]  02/20/2020  9:00 AM Jerry Mutters, PA-C [facility] None    ------------------------------------------------------------------------------------------------------------------   HPI BP 116/74   Pulse 88   Temp (!) 97.2 F (36.2 C)   Resp 16   Ht 6' 0.5" (1.842 m)   Wt 245 lb (111.1 kg)   BMI 32.77 kg/m   64 y.o.male with hx of sarcoidosis, T2DM, presents for evaluation of rash to bilateral popliteal fossa.   He reports rash appeared 2 weeks ago, noted mild itching behind bilateral knees, had some blisters initially which burst and dried up, but irritation seems somewhat worse/mildly spread. Denies new blisters. Denies pain, burning, discharge.   Has taken benadryl at night as needed in the evening, has tried calamine lotion which has helped. Hasn't tried cortisone.   Denies new medications, foods, hygiene products or detergents. Wife does not have any sx.   Last A1C in the office was:  Lab Results  Component Value Date   HGBA1C 5.7 (H) 06/05/2019     Past Medical History:  Diagnosis Date  . Chest pain 05/16/2012  . Colon polyps   . DDD (degenerative disc disease)   . Diabetes mellitus without complication ([place])   . Hepatitis C    treated for hepatatis but  not sure what type  . Hiatal hernia   . Hypercholesteremia   . Marijuana use   . Osteoarthritis   . Sarcoid   . Thrombocytopenia ([place])   . Tobacco abuse      Allergies  Allergen Reactions  . Chantix [Varenicline]     Nausea  . Lopid [Gemfibrozil]     Nausea/weakness    Current Outpatient Medications on File Prior to Visit  Medication Sig  . cyclobenzaprine (FLEXERIL) 10 MG tablet Take 1 tablet (10 mg total) by mouth at bedtime as needed for muscle spasms.  . famotidine (PEPCID) 40 MG tablet Take 1 tablet (40 mg total) by mouth every evening.  . fenofibrate micronized (LOFIBRA) 134 MG capsule TAKE 1 CAPSULE BY MOUTH BEFORE BREAKFAST  . fenofibrate micronized (LOFIBRA) 200 MG capsule Take 1 capsule (200 mg total) by mouth daily before breakfast.  . glipiZIDE (GLUCOTROL) 5 MG tablet Take 1/2 tab daily with breakfast if fasting glucose is 130+. Do not take if not eating or having low blood sugars. Take a second 1/2 tab with dinner if fasting remains persistently above 130.  . glucose blood test strip Check blood sugar 1 time daily-DX-E11.9  . Lancets (ONETOUCH DELICA PLUS [id number]) MISC Check blood sugar 1 time daily-E11.9  . meloxicam (MOBIC) 15 MG tablet Take once daily for 2 weeks, then 1/2-1 tab daily as needed. Can take with tylenol, do not take with ibuprofen or aleve.  . metFORMIN (GLUCOPHAGE-XR) 500 MG 24 hr tablet Take 2 tablets 2 x /day with Meals for Diabetes  .  pantoprazole (PROTONIX) 40 MG tablet Take 1 tablet (40 mg total) by mouth daily as needed.  . Vitamin D, Ergocalciferol, (DRISDOL) 1.25 MG (50000 UNIT) CAPS capsule 1 PILL 3 DAYS A WEEK FOR VITAMIN D DEFICIENCY  . buPROPion (WELLBUTRIN XL) 150 MG 24 hr tablet TAKE 1 TABLET BY MOUTH EVERY DAY IN THE MORNING (Patient not taking: Reported on 09/18/2019)   No current facility-administered medications on file prior to visit.    ROS: all negative except above.   Physical Exam:  BP 116/74   Pulse 88   Temp (!) 97.2  F (36.2 C)   Resp 16   Ht 6' 0.5" (1.842 m)   Wt 245 lb (111.1 kg)   BMI 32.77 kg/m   General Appearance: Well nourished, in no apparent distress. Eyes: PERRLA, conjunctiva no swelling or erythema ENT/Mouth: Ext aud canals clear, TMs without erythema, bulging. No erythema, swelling, or exudate on post pharynx.  Tonsils not swollen or erythematous. Hearing normal.  Neck: Supple, thyroid normal.  Respiratory: Respiratory effort normal, BS equal bilaterally without rales, rhonchi, wheezing or stridor.  Cardio: RRR with no MRGs. Brisk peripheral pulses without edema.  Abdomen: Soft, + BS.  Non tender, no guarding, masses. Lymphatics: Non tender without lymphadenopathy.  Musculoskeletal: No effusions, deformity, normal gait.  Skin: Warm, dry; he has bilateral mildly erythematous scaly, dry areas to bilateral popliteal fossa, no distinct lesions, no discharge, rough skin texture, dry, no discharge, heat Neuro: Normal muscle tone, Sensation intact.  Psych: Awake and oriented X 3, normal affect, Insight and Judgment appropriate.     Dan Maker, NP 3:12 PM [facility]

## 2019-09-24 ENCOUNTER — Telehealth: Payer: Self-pay

## 2019-09-24 NOTE — Telephone Encounter (Signed)
Just an FYI, patient states that the rash is doing much better since he started the medication.

## 2019-09-25 ENCOUNTER — Other Ambulatory Visit: Payer: Self-pay | Admitting: Physician Assistant

## 2019-11-12 ENCOUNTER — Telehealth: Payer: Self-pay

## 2019-11-12 DIAGNOSIS — D869 Sarcoidosis, unspecified: Secondary | ICD-10-CM

## 2019-11-12 NOTE — Telephone Encounter (Signed)
Patient is requesting a referral to Dr. Deanne Coffer for his Sarcoidosis.

## 2019-11-12 NOTE — Addendum Note (Signed)
Addended by: Quentin Mulling R on: 11/12/2019 01:43 PM   Modules accepted: Orders

## 2019-11-18 NOTE — Progress Notes (Signed)
Assessment and Plan:  Jerry Stewart was seen today for referral.  Diagnoses and all orders for this visit:  Acute pain of right knee He is taking Meloxicam without improvement Discussed further conservative treatment ie prednisone, trigger point injection? Wants referral to Rheumatology related to knee and multiple joint pains with history of sarcoidosis  Vitamin D deficiency -     Vitamin D, Ergocalciferol, (DRISDOL) 1.25 MG (50000 UNIT) CAPS capsule; Take 1 tablet 3 days a week for Vitamin D deficiency  Multiple joint pain -     Sedimentation rate -     Anti-DNA antibody, double-stranded -     Rheumatoid factor -     C-reactive protein  Sarcoidosis Chest xray order 01/2019, not completed -     CBC with Differential/Platelet -     COMPLETE METABOLIC PANEL WITH GFR      Further disposition pending results of labs. Discussed med's effects and SE's.   Over 30 minutes of exam, counseling, chart review, and critical decision making was performed.   Future Appointments  Date Time Provider Department Center  02/20/2020  9:00 AM Jeniya Flannigan, Bella Kennedy, NP [facility] None    ------------------------------------------------------------------------------------------------------------------   HPI 64 y.o.male presents for evaluation of right knee pain.  Report reports that it started over 3 weeks ago.  It is a 10/10 pain.  Reports it is constant and on the medial aspect of the knee.  He also reports the pain feels deep inside the joint.  Reports that he has taken meloxicam for this but it is not helping.  He reports that he has to get up 4-5 times a night to help relieve the pain as it increases when he lays down at night.  He has used some ice for this.  He reports when he sits it seems to get worse.  He tried to move it around to stretch but that does not improve the pain.  He reports being up on it makes it worse.   He reports similar pain in his elbows and shoulders as well.  Reports he has not  had any pain like this in the past.  He reports history of sarcoid and wants to see a rheumatologist for this.   Past Medical History:  Diagnosis Date  . Chest pain 05/16/2012  . Colon polyps   . DDD (degenerative disc disease)   . Diabetes mellitus without complication ([facility])   . Hepatitis C    treated for hepatatis but not sure what type  . Hiatal hernia   . Hypercholesteremia   . Marijuana use   . Osteoarthritis   . Sarcoid   . Thrombocytopenia ([facility])   . Tobacco abuse      Allergies  Allergen Reactions  . Chantix [Varenicline]     Nausea  . Lopid [Gemfibrozil]     Nausea/weakness    Current Outpatient Medications on File Prior to Visit  Medication Sig  . buPROPion (WELLBUTRIN XL) 150 MG 24 hr tablet TAKE 1 TABLET BY MOUTH EVERY DAY IN THE MORNING  . famotidine (PEPCID) 40 MG tablet TAKE 1 TABLET BY MOUTH EVERY DAY IN THE EVENING  . fenofibrate micronized (LOFIBRA) 134 MG capsule TAKE 1 CAPSULE BY MOUTH BEFORE BREAKFAST  . glipiZIDE (GLUCOTROL) 5 MG tablet Take 1/2 tab daily with breakfast if fasting glucose is 130+. Do not take if not eating or having low blood sugars. Take a second 1/2 tab with dinner if fasting remains persistently above 130.  . glucose blood test strip Check blood  sugar 1 time daily-DX-E11.9  . Lancets (ONETOUCH DELICA PLUS [id number]) MISC Check blood sugar 1 time daily-E11.9  . meloxicam (MOBIC) 15 MG tablet Take once daily for 2 weeks, then 1/2-1 tab daily as needed. Can take with tylenol, do not take with ibuprofen or aleve.  . metFORMIN (GLUCOPHAGE-XR) 500 MG 24 hr tablet Take 2 tablets 2 x /day with Meals for Diabetes  . pantoprazole (PROTONIX) 40 MG tablet Take 1 tablet (40 mg total) by mouth daily as needed.  . triamcinolone ointment (KENALOG) 0.1 % Apply 1 application topically 2 (two) times daily. For 1-2 weeks until rash is resolved.  . cyclobenzaprine (FLEXERIL) 10 MG tablet Take 1 tablet (10 mg total) by mouth at bedtime as needed for muscle  spasms. (Patient not taking: Reported on 11/20/2019)   No current facility-administered medications on file prior to visit.    ROS: all negative except above.   Physical Exam:  BP 122/80   Pulse 94   Temp (!) 97.3 F (36.3 C)   Wt 234 lb 12.8 oz (106.5 kg)   SpO2 97%   BMI 31.41 kg/m   General Appearance: Well nourished, in no apparent distress. Eyes: PERRLA, EOMs, conjunctiva no swelling or erythema Sinuses: No Frontal/maxillary tenderness ENT/Mouth: Ext aud canals clear, TMs without erythema, bulging. No erythema, swelling, or exudate on post pharynx.  Tonsils not swollen or erythematous. Hearing normal.  Neck: Supple, thyroid normal.  Respiratory: Respiratory effort normal, BS equal bilaterally without rales, rhonchi, wheezing or stridor.  Cardio: RRR with no MRGs. Brisk peripheral pulses without edema.  Abdomen: Soft, + BS.  Non tender, no guarding, rebound, hernias, masses. Lymphatics: Non tender without lymphadenopathy.  Musculoskeletal: Full ROM, 5/5 strength, abnormal gait with initiation of ambulation.  Tender to palpation of MCL.  Knee joint is stable, no crepitus, effusion or erythema noted. Skin: Warm, dry without rashes, lesions, ecchymosis.  Neuro: Cranial nerves intact. Normal muscle tone, no cerebellar symptoms. Sensation intact.  Psych: Awake and oriented X 3, normal affect, Insight and Judgment appropriate.     Elder Negus, NP 4:49 PM [facility]

## 2019-11-19 ENCOUNTER — Ambulatory Visit: Payer: No Typology Code available for payment source | Admitting: Adult Health

## 2019-11-20 ENCOUNTER — Encounter: Payer: Self-pay | Admitting: Adult Health Nurse Practitioner

## 2019-11-20 ENCOUNTER — Ambulatory Visit (INDEPENDENT_AMBULATORY_CARE_PROVIDER_SITE_OTHER): Payer: No Typology Code available for payment source | Admitting: Adult Health Nurse Practitioner

## 2019-11-20 ENCOUNTER — Other Ambulatory Visit: Payer: Self-pay

## 2019-11-20 VITALS — BP 122/80 | HR 94 | Temp 97.3°F | Wt 234.8 lb

## 2019-11-20 DIAGNOSIS — M25561 Pain in right knee: Secondary | ICD-10-CM | POA: Diagnosis not present

## 2019-11-20 DIAGNOSIS — Z79899 Other long term (current) drug therapy: Secondary | ICD-10-CM

## 2019-11-20 DIAGNOSIS — M2550 Pain in unspecified joint: Secondary | ICD-10-CM

## 2019-11-20 DIAGNOSIS — D869 Sarcoidosis, unspecified: Secondary | ICD-10-CM

## 2019-11-20 DIAGNOSIS — E559 Vitamin D deficiency, unspecified: Secondary | ICD-10-CM | POA: Diagnosis not present

## 2019-11-20 MED ORDER — VITAMIN D (ERGOCALCIFEROL) 1.25 MG (50000 UNIT) PO CAPS: ORAL_CAPSULE | ORAL | 1 refills | Status: DC

## 2019-11-20 NOTE — Patient Instructions (Addendum)
   Try some Diclofenac (Voltaren) gel to your knee.  You can get this at any pharmacy.  Use a directed.  Also apply Ice to the area at a time    We will place a referral to Rhuematology.   We will call you with the lab results from today.

## 2019-11-21 LAB — CBC WITH DIFFERENTIAL/PLATELET
Absolute Monocytes: 584 cells/uL (ref 200–950)
Basophils Absolute: 64 cells/uL (ref 0–200)
Basophils Relative: 0.8 %
Eosinophils Absolute: 288 cells/uL (ref 15–500)
Eosinophils Relative: 3.6 %
HCT: 45.4 % (ref 38.5–50.0)
Hemoglobin: 14.8 g/dL (ref 13.2–17.1)
Lymphs Abs: 1912 cells/uL (ref 850–3900)
MCH: 28 pg (ref 27.0–33.0)
MCHC: 32.6 g/dL (ref 32.0–36.0)
MCV: 85.8 fL (ref 80.0–100.0)
MPV: 10.2 fL (ref 7.5–12.5)
Monocytes Relative: 7.3 %
Neutro Abs: 5152 cells/uL (ref 1500–7800)
Neutrophils Relative %: 64.4 %
Platelets: 240 10*3/uL (ref 140–400)
RBC: 5.29 10*6/uL (ref 4.20–5.80)
RDW: 12.7 % (ref 11.0–15.0)
Total Lymphocyte: 23.9 %
WBC: 8 10*3/uL (ref 3.8–10.8)

## 2019-11-21 LAB — COMPLETE METABOLIC PANEL WITH GFR
AG Ratio: 2 (calc) (ref 1.0–2.5)
ALT: 26 U/L (ref 9–46)
AST: 21 U/L (ref 10–35)
Albumin: 4.8 g/dL (ref 3.6–5.1)
Alkaline phosphatase (APISO): 75 U/L (ref 35–144)
BUN: 19 mg/dL (ref 7–25)
CO2: 27 mmol/L (ref 20–32)
Calcium: 10.3 mg/dL (ref 8.6–10.3)
Chloride: 107 mmol/L (ref 98–110)
Creat: 1.19 mg/dL (ref 0.70–1.25)
GFR, Est African American: 75 mL/min/{1.73_m2} (ref >=60)
GFR, Est Non African American: 65 mL/min/{1.73_m2} (ref >=60)
Globulin: 2.4 g/dL (calc) (ref 1.9–3.7)
Glucose, Bld: 147 mg/dL — ABNORMAL HIGH (ref 65–99)
Potassium: 4.1 mmol/L (ref 3.5–5.3)
Sodium: 142 mmol/L (ref 135–146)
Total Bilirubin: 0.5 mg/dL (ref 0.2–1.2)
Total Protein: 7.2 g/dL (ref 6.1–8.1)

## 2019-11-21 LAB — SEDIMENTATION RATE: Sed Rate: 2 mm/h (ref 0–20)

## 2019-11-21 LAB — C-REACTIVE PROTEIN: CRP: 2.6 mg/L (ref <8.0)

## 2019-11-21 LAB — ANTI-DNA ANTIBODY, DOUBLE-STRANDED: ds DNA Ab: <1 IU/mL

## 2019-11-21 LAB — RHEUMATOID FACTOR: Rheumatoid fact SerPl-aCnc: <14 IU/mL (ref <14)

## 2019-11-27 ENCOUNTER — Other Ambulatory Visit: Payer: Self-pay | Admitting: Adult Health Nurse Practitioner

## 2019-11-27 DIAGNOSIS — E785 Hyperlipidemia, unspecified: Secondary | ICD-10-CM

## 2020-02-20 ENCOUNTER — Encounter: Payer: 59 | Admitting: Adult Health Nurse Practitioner

## 2020-02-24 NOTE — Progress Notes (Signed)
COMPLETE PHYSICAL   Assessment and Plan:  Jerry Stewart was seen today for complete physical  Diagnoses and all orders for this visit:  Encounter for general adult examination with abnormal findings Yearly   Essential hypertension       Lifestyle controled Monitor blood pressure at home; call if consistently over 130/80 Continue DASH diet.   Reminder to go to the ER if any CP, SOB, nausea, dizziness, severe HA, changes vision/speech, left arm numbness and tingling and jaw pain. -     COMPLETE METABOLIC PANEL WITH GFR -     CBC with Differential/Platelet   Hyperlipidemia, unspecified hyperlipidemia type Continue medications:Lofibra Discussed dietary and exercise modifications Low fat diet -     Lipid panel  Gastroesophageal reflux disease with esophagitis Doing well at this time Continue Pantropazole 40mg  daily Diet discussed Monitor for triggers Avoid food with high acid content Avoid excessive cafeine Increase water intake' -     pantoprazole (PROTONIX) 40 MG tablet; Take 1 tablet (40 mg total) by mouth daily as needed.  Type 2 diabetes mellitus with hyperglycemia, without long-term current use of insulin (HCC) Continue medications: MetforminXL 500mg  two tablets BID with meals. Taking glipizide 5mg  half tablet is glucsose over 200 Discussed general issues about diabetes pathophysiology and management. Education: Reviewed 'ABCs' of diabetes management (respective goals in parentheses):  A1C (<7), blood pressure (<130/80), and cholesterol (LDL <70) Dietary recommendations Encouraged aerobic exercise.  Discussed foot care, check daily Yearly retinal exam Dental exam every 6 months Monitor blood glucose, discussed goal for patient -     Hemoglobin A1c  Vitamin D deficiency Continue supplementation to maintain goal of 70-100 Taking Vitamin D 50,000 IU daily three days a week, completed and did not start OTC -     VITAMIN D 25 Hydroxy (Vit-D Deficiency, Fractures)   Sarcoidosis Advised to stop smoking Has not gone for chest X-ray, encouraged follow up for this  Thrombocytopenia (HCC) Will continue to monitor -     CBC with Diff  Recurrent major depressive disorder, in partial remission (HCC) Restart Wellbutrin XL150mg  - continue medications, stress management techniques discussed, increase water, good sleep hygiene discussed, increase exercise, and increase veggies.   Obesity (BMI 30.0-34.9) BMI 31 - long discussion about weight loss, diet, and exercise -recommended diet heavy in fruits and veggies and low in animal meats, cheeses, and dairy products  Vitamin D deficiency -     Vitamin D (25 hydroxy) -     Vitamin D, Ergocalciferol, (DRISDOL) 1.25 MG (50000 UT) CAPS capsule; 1 pill 3 days a week for vitamin d deficiency Discussed that def can worsen COVID outcomes, will do vitamin D RX 3 x a week for 3 months and recheck.   Screening urine blood or protein -UA  Screening ischemic heart disease -EKG  Screening PSA -PSA  Medication Management Continued    Further disposition pending results if labs check today. Discussed med's effects and SE's.   Over 40 minutes of face to face interview, exam, counseling, chart review, and critical decision making was performed.     Future Appointments  Date Time Provider Department Center  02/24/2021  3:00 PM Elder NegusMcClanahan, Willett Lefeber, NP [facility] None    HPI Patient presents for 3 month follow up for HTN, HLD, DMIII, obesity and vitamin D deficiency.  Reports that he is having some left sided back pain.  It in intermittent in nature.  He has noticed it when he coughs or positional.  Reports it is sharp short burst that is  pins/needle like. Reports it is localized to the area.  He has not had anything like this in the past.    He was having some knee pains and rheumatologist recommended Voltaren gel.  He smokes and has sarcoidosis, followed with Dr. Shelle Stewart and has seen Dr. [place] in the past but it  has been years, had visit with Dr. Everardo All in 02/2018.  Denies SOB but states that he is not moving around as much.  He continues to smoke, smokes a half a pack a day x 20 years, he states he can not handle the patches. He is on wellbutrin but states it is not better. he tried chantix but states he could not continue due to side effects.  He is not ready to quit at this tim.    His blood pressure has been controlled at home, today their BP is BP: 132/76 He does not workout. He denies chest pain, shortness of breath, dizziness.  He is on cholesterol medication, fenofibrate and denies myalgias. His cholesterol is not at goal. The cholesterol last visit was:   Lab Results  Component Value Date   CHOL 180 06/05/2019   HDL 29 (L) 06/05/2019   LDLCALC 109 (H) 06/05/2019   TRIG 316 (H) 06/05/2019   CHOLHDL 6.2 (H) 06/05/2019   He reports that his fasting glucose ranges from 100-130's.  He had one that was 200 the other day.  He take glipazide if it is 200 or higher.    He has been working on diet and exercise for prediabetes, he is on metformin, and denies paresthesia of the feet, polydipsia and polyuria. Last A1C in the office was:  Lab Results  Component Value Date   HGBA1C 5.7 (H) 06/05/2019   Patient is on Vitamin D supplement.   Lab Results  Component Value Date   VD25OH 52 06/05/2019     Last PSA is  Lab Results  Component Value Date   PSA 1.4 02/20/2019   PSA 1.2 02/09/2018   PSA 1.5 02/08/2017   He has a history of hepatitis C, followed with Dr. Lucas Mallow, treated on 04/22/2014, Harvoni.  Has had Hep B vaccine, no longer needs to follow up, will follow up here.   BMI is Body mass index is 28.82 kg/m., he is working on diet and exercise. Wt Readings from Last 3 Encounters:  02/25/20 243 lb (110.2 kg)  11/20/19 234 lb 12.8 oz (106.5 kg)  09/18/19 245 lb (111.1 kg)     Current Medications:  Current Outpatient Medications on File Prior to Visit  Medication Sig Dispense Refill   . buPROPion (WELLBUTRIN XL) 150 MG 24 hr tablet TAKE 1 TABLET BY MOUTH EVERY DAY IN THE MORNING 90 tablet 1  . famotidine (PEPCID) 40 MG tablet TAKE 1 TABLET BY MOUTH EVERY DAY IN THE EVENING 90 tablet 1  . fenofibrate micronized (LOFIBRA) 134 MG capsule TAKE 1 CAPSULE BY MOUTH BEFORE BREAKFAST 90 capsule 1  . fenofibrate micronized (LOFIBRA) 200 MG capsule TAKE 1 CAPSULE (200 MG TOTAL) BY MOUTH DAILY BEFORE BREAKFAST. 90 capsule 1  . glipiZIDE (GLUCOTROL) 5 MG tablet Take 1/2 tab daily with breakfast if fasting glucose is 130+. Do not take if not eating or having low blood sugars. Take a second 1/2 tab with dinner if fasting remains persistently above 130. 90 tablet 1  . glucose blood test strip Check blood sugar 1 time daily-DX-E11.9 100 each 12  . Lancets (ONETOUCH DELICA PLUS LANCET33G) MISC Check blood sugar 1 time  daily-E11.9 100 each 3  . meloxicam (MOBIC) 15 MG tablet Take once daily for 2 weeks, then 1/2-1 tab daily as needed. Can take with tylenol, do not take with ibuprofen or aleve. 30 tablet 2  . metFORMIN (GLUCOPHAGE-XR) 500 MG 24 hr tablet Take 2 tablets 2 x /day with Meals for Diabetes 360 tablet 3  . pantoprazole (PROTONIX) 40 MG tablet Take 1 tablet (40 mg total) by mouth daily as needed. 90 tablet 0  . triamcinolone ointment (KENALOG) 0.1 % Apply 1 application topically 2 (two) times daily. For 1-2 weeks until rash is resolved. 80 g 1  . Vitamin D, Ergocalciferol, (DRISDOL) 1.25 MG (50000 UNIT) CAPS capsule Take 1 tablet 3 days a week for Vitamin D deficiency 36 capsule 1  . cyclobenzaprine (FLEXERIL) 10 MG tablet Take 1 tablet (10 mg total) by mouth at bedtime as needed for muscle spasms. (Patient not taking: Reported on 02/25/2020) 60 tablet 0   No current facility-administered medications on file prior to visit.   Health Maintenance:  Immunization History  Administered Date(s) Administered  . Hepatitis B 06/24/2014  . Hepatitis B, adult 06/24/2014  . Influenza Inj Mdck  Quad With Preservative 02/08/2017, 02/09/2018  . Influenza Whole 01/17/2012  . Influenza, Seasonal, Injecte, Preservative Fre 02/09/2016  . Influenza,inj,Quad PF,6+ Mos 11/05/2018  . Influenza-Unspecified 11/05/2018, 12/23/2019  . Pneumococcal Conjugate-13 12/28/2011  . Pneumococcal Polysaccharide-23 10/03/2012  . Tdap 10/04/2011    Tetanus: 09/2011 Pneumovax: 2014 Prevnar 13: 2013 Flu vaccine: 11/2019 Zostavax/Shingrix Discussed with patient  COVID19 SARS 2: 1/2 on 06/10/19 Hep B: 2016 Colonoscopy: 12/2013 Dr. Christella Hartigan Q10 years Heart cath 04/2013 Hep C: 09/2013 Dr Christella Hartigan  Allergies:  Allergies  Allergen Reactions  . Chantix [Varenicline]     Nausea  . Lopid [Gemfibrozil]     Nausea/weakness   Medical History:  Past Medical History:  Diagnosis Date  . Chest pain 05/16/2012  . Colon polyps   . DDD (degenerative disc disease)   . Diabetes mellitus without complication (HCC)   . Hepatitis C    treated for hepatatis but not sure what type  . Hiatal hernia   . Hypercholesteremia   . Marijuana use   . Osteoarthritis   . Sarcoid   . Thrombocytopenia (HCC)   . Tobacco abuse    SURGICAL HISTORY He  has a past surgical history that includes Appendectomy; HEAD TRAUMA (1975); and left heart catheterization with coronary angiogram (N/A, 05/17/2012). FAMILY HISTORY His family history includes Cancer in his father; Diabetes Mellitus II in his mother; Leukemia in his mother; Other in an other family member; Sarcoidosis in his paternal aunt and sister. SOCIAL HISTORY He  reports that he has been smoking cigarettes. He has a 15.00 pack-year smoking history. He has never used smokeless tobacco. He reports current alcohol use of about 6.0 standard drinks of alcohol per week. He reports current drug use. Frequency: 2.00 times per week. Drug: Marijuana.  Review of Systems  Constitutional: Negative.  Negative for chills, diaphoresis, fever, malaise/fatigue and weight loss.  HENT: Negative  for congestion, ear discharge, ear pain, hearing loss, nosebleeds, sore throat and tinnitus.   Eyes: Negative.   Respiratory: Negative for cough, shortness of breath and stridor.   Cardiovascular: Negative.  Negative for leg swelling.  Gastrointestinal: Negative.   Genitourinary: Negative.   Musculoskeletal: Negative for back pain, falls, joint pain, myalgias and neck pain.  Skin: Negative.   Neurological: Negative.  Negative for weakness and headaches.  Psychiatric/Behavioral: Negative.  Physical Exam: Estimated body mass index is 28.82 kg/m as calculated from the following:   Height as of this encounter: 6\' 5"  (1.956 m).   Weight as of this encounter: 243 lb (110.2 kg). BP 132/76   Pulse 80   Temp 97.8 F (36.6 C)   Ht 6\' 5"  (1.956 m)   Wt 243 lb (110.2 kg)   SpO2 97%   BMI 28.82 kg/m  General Appearance: Obese, in no apparent distress.  Eyes: PERRLA, EOMs, conjunctiva no swelling or erythema, normal fundi and vessels.   Neck: Supple, thyroid normal. No bruits  Respiratory: Respiratory effort normal, CTA-B,  without rales, rhonchi, or stridor.  Cardio: RRR without murmurs, rubs or gallops. Brisk peripheral pulses without edema.  Chest: symmetric, with normal excursions and percussion.  Abdomen: Soft, nontender, obese no guarding, rebound, hernias, masses, or organomegaly. .  Lymphatics: Non tender without lymphadenopathy.  Genitourinary: defer Musculoskeletal: Full ROM all peripheral extremities,5/5 strength, and antalgic gait.  Neurological Exam: normal  Vascular Exam: normal. Skin: Warm, dry without rashes, lesions, ecchymosis. Neuro: Cranial nerves intact, reflexes equal bilaterally. Normal muscle tone, no cerebellar symptoms. Psych: Awake and oriented X 3, normal affect, Insight and Judgment appropriate.    , NP 5:09 PM [facility]

## 2020-02-25 ENCOUNTER — Encounter: Payer: Self-pay | Admitting: Adult Health Nurse Practitioner

## 2020-02-25 ENCOUNTER — Other Ambulatory Visit: Payer: Self-pay

## 2020-02-25 ENCOUNTER — Ambulatory Visit (INDEPENDENT_AMBULATORY_CARE_PROVIDER_SITE_OTHER): Payer: No Typology Code available for payment source | Admitting: Adult Health Nurse Practitioner

## 2020-02-25 VITALS — BP 132/76 | HR 80 | Temp 97.8°F | Ht 77.0 in | Wt 243.0 lb

## 2020-02-25 DIAGNOSIS — F17219 Nicotine dependence, cigarettes, with unspecified nicotine-induced disorders: Secondary | ICD-10-CM

## 2020-02-25 DIAGNOSIS — Z136 Encounter for screening for cardiovascular disorders: Secondary | ICD-10-CM

## 2020-02-25 DIAGNOSIS — E559 Vitamin D deficiency, unspecified: Secondary | ICD-10-CM

## 2020-02-25 DIAGNOSIS — Z0000 Encounter for general adult medical examination without abnormal findings: Secondary | ICD-10-CM

## 2020-02-25 DIAGNOSIS — K2100 Gastro-esophageal reflux disease with esophagitis, without bleeding: Secondary | ICD-10-CM

## 2020-02-25 DIAGNOSIS — Z0001 Encounter for general adult medical examination with abnormal findings: Secondary | ICD-10-CM

## 2020-02-25 DIAGNOSIS — E669 Obesity, unspecified: Secondary | ICD-10-CM

## 2020-02-25 DIAGNOSIS — Z720 Tobacco use: Secondary | ICD-10-CM

## 2020-02-25 DIAGNOSIS — I10 Essential (primary) hypertension: Secondary | ICD-10-CM

## 2020-02-25 DIAGNOSIS — D696 Thrombocytopenia, unspecified: Secondary | ICD-10-CM

## 2020-02-25 DIAGNOSIS — E1165 Type 2 diabetes mellitus with hyperglycemia: Secondary | ICD-10-CM

## 2020-02-25 DIAGNOSIS — Z125 Encounter for screening for malignant neoplasm of prostate: Secondary | ICD-10-CM

## 2020-02-25 DIAGNOSIS — E785 Hyperlipidemia, unspecified: Secondary | ICD-10-CM

## 2020-02-25 DIAGNOSIS — D869 Sarcoidosis, unspecified: Secondary | ICD-10-CM

## 2020-02-25 DIAGNOSIS — Z1389 Encounter for screening for other disorder: Secondary | ICD-10-CM

## 2020-02-25 DIAGNOSIS — E66811 Obesity, class 1: Secondary | ICD-10-CM

## 2020-02-25 DIAGNOSIS — F3341 Major depressive disorder, recurrent, in partial remission: Secondary | ICD-10-CM

## 2020-02-25 MED ORDER — BUPROPION HCL ER (XL) 150 MG PO TB24: ORAL_TABLET | ORAL | 1 refills | Status: DC

## 2020-02-25 NOTE — Patient Instructions (Signed)
We sent in prescription for your Wellbutrin, take this every day.  This helps with mood.  Will call you with lab results in 1-3 days.  You may use Voltaren gel to your lower back.  You may continue Meloxicam daily, we will look at our kidney function to make sure you are tolerating this.  DO NOT take any ibuprofen while taking this.  You can take tylenol if needed for pain, it is different.    GENERAL HEALTH GOALS  Know what a healthy weight is for you (roughly BMI <25) and aim to maintain this  Aim for 7+ servings of fruits and vegetables daily  70-80+ fluid ounces of water or unsweet tea for healthy kidneys  Limit to max 1 drink of alcohol per day; avoid smoking/tobacco  Limit animal fats in diet for cholesterol and heart health - choose grass fed whenever available  Avoid highly processed foods, and foods high in saturated/trans fats  Aim for low stress - take time to unwind and care for your mental health  Aim for 150 min of moderate intensity exercise weekly for heart health, and weights twice weekly for bone health  Aim for 7-9 hours of sleep daily   Aim for 7+ servings of fruits and vegetables daily   65+ fluid ounces of water for healthy kidneys   Limit animal fats in diet for cholesterol and heart health - choose grass fed whenever available   Aim for low stress - take time to unwind and care for your mental health   Aim for 150 min of moderate intensity exercise weekly for heart health, and weights twice weekly for bone health   Aim for 7-9 hours of sleep daily          When it comes to diets, agreement about the perfect plan isnt easy to find, even among the experts. Experts at the [facility] of [employer] developed an idea known as the Healthy Eating Plate. Just imagine a plate divided into logical, healthy portions.   The emphasis is on diet quality:   Load up on vegetables and fruits - one-half of your plate: Aim for color and variety, and  remember that white potatoes are high starch and should be consumed in moderation.     Go for whole grains - one-quarter of your plate: Whole wheat, barley, wheat berries, quinoa, oats, brown rice, and foods made with them. If you want pasta, go with whole wheat pasta as serving sized noted on package.   Protein power - one-quarter of your plate: Fish, chicken, beans, and nuts are all healthy, versatile protein sources. Limit red meat.   The diet, however, does go beyond the plate, offering a few other suggestions.   Use healthy plant oils, such as olive, canola, soy, corn, sunflower and peanut. Check the labels, and avoid partially hydrogenated oil, which have unhealthy trans fats.  This will help your cholesterol and heart health.   If youre thirsty, drink water.  Aim for 80 oz of water a day.  Coffee and tea are ok in moderation, but skip sugary drinks (juices, sodas, sweet tea) and limit milk and dairy products to one or two daily servings.   The type of carbohydrate in the diet is more important than the amount. Some sources of carbohydrates, such as vegetables, fruits, whole grains, and beans--are healthier than others.  If you are tech-savvy, check out https://www.bernard.org/ for tips for replacing current unhealthy food choices, eating healthy on a budget, recipes and  more.   Finally, stay active! Walking is an activity, involve family and friends.  It is FREE and maintains and or improves your health.

## 2020-02-26 LAB — URINALYSIS W MICROSCOPIC + REFLEX CULTURE
Bacteria, UA: NONE SEEN /HPF
Bilirubin Urine: NEGATIVE
Glucose, UA: NEGATIVE
Hgb urine dipstick: NEGATIVE
Hyaline Cast: NONE SEEN /LPF
Ketones, ur: NEGATIVE
Leukocyte Esterase: NEGATIVE
Nitrites, Initial: NEGATIVE
Protein, ur: NEGATIVE
RBC / HPF: NONE SEEN /HPF (ref 0–2)
Specific Gravity, Urine: 1.017 (ref 1.001–1.03)
Squamous Epithelial / HPF: NONE SEEN /HPF (ref <=5)
WBC, UA: NONE SEEN /HPF (ref 0–5)
pH: 7 (ref 5.0–8.0)

## 2020-02-26 LAB — CBC WITH DIFFERENTIAL/PLATELET
Absolute Monocytes: 622 cells/uL (ref 200–950)
Basophils Absolute: 59 cells/uL (ref 0–200)
Basophils Relative: 0.7 %
Eosinophils Absolute: 328 cells/uL (ref 15–500)
Eosinophils Relative: 3.9 %
HCT: 45.4 % (ref 38.5–50.0)
Hemoglobin: 14.9 g/dL (ref 13.2–17.1)
Lymphs Abs: 1991 cells/uL (ref 850–3900)
MCH: 28.4 pg (ref 27.0–33.0)
MCHC: 32.8 g/dL (ref 32.0–36.0)
MCV: 86.5 fL (ref 80.0–100.0)
MPV: 10.7 fL (ref 7.5–12.5)
Monocytes Relative: 7.4 %
Neutro Abs: 5401 cells/uL (ref 1500–7800)
Neutrophils Relative %: 64.3 %
Platelets: 283 10*3/uL (ref 140–400)
RBC: 5.25 10*6/uL (ref 4.20–5.80)
RDW: 12.4 % (ref 11.0–15.0)
Total Lymphocyte: 23.7 %
WBC: 8.4 10*3/uL (ref 3.8–10.8)

## 2020-02-26 LAB — COMPLETE METABOLIC PANEL WITH GFR
AG Ratio: 2.1 (calc) (ref 1.0–2.5)
ALT: 15 U/L (ref 9–46)
AST: 15 U/L (ref 10–35)
Albumin: 4.8 g/dL (ref 3.6–5.1)
Alkaline phosphatase (APISO): 73 U/L (ref 35–144)
BUN: 13 mg/dL (ref 7–25)
CO2: 26 mmol/L (ref 20–32)
Calcium: 10.4 mg/dL — ABNORMAL HIGH (ref 8.6–10.3)
Chloride: 107 mmol/L (ref 98–110)
Creat: 0.92 mg/dL (ref 0.70–1.25)
GFR, Est African American: 103 mL/min/{1.73_m2} (ref >=60)
GFR, Est Non African American: 89 mL/min/{1.73_m2} (ref >=60)
Globulin: 2.3 g/dL (calc) (ref 1.9–3.7)
Glucose, Bld: 97 mg/dL (ref 65–99)
Potassium: 4.4 mmol/L (ref 3.5–5.3)
Sodium: 143 mmol/L (ref 135–146)
Total Bilirubin: 0.5 mg/dL (ref 0.2–1.2)
Total Protein: 7.1 g/dL (ref 6.1–8.1)

## 2020-02-26 LAB — HEMOGLOBIN A1C
Hgb A1c MFr Bld: 6 % of total Hgb — ABNORMAL HIGH (ref <5.7)
Mean Plasma Glucose: 126 (calc)
eAG (mmol/L): 7 (calc)

## 2020-02-26 LAB — MICROALBUMIN / CREATININE URINE RATIO
Creatinine, Urine: 109 mg/dL (ref 20–320)
Microalb Creat Ratio: 31 mcg/mg creat — ABNORMAL HIGH (ref <30)
Microalb, Ur: 3.4 mg/dL

## 2020-02-26 LAB — TSH: TSH: 2.02 mIU/L (ref 0.40–4.50)

## 2020-02-26 LAB — NO CULTURE INDICATED

## 2020-02-26 LAB — LIPID PANEL
Cholesterol: 193 mg/dL (ref <200)
HDL: 30 mg/dL — ABNORMAL LOW (ref >=40)
Non-HDL Cholesterol (Calc): 163 mg/dL (calc) — ABNORMAL HIGH (ref <130)
Total CHOL/HDL Ratio: 6.4 (calc) — ABNORMAL HIGH (ref <5.0)
Triglycerides: 403 mg/dL — ABNORMAL HIGH (ref <150)

## 2020-02-26 LAB — MAGNESIUM: Magnesium: 2 mg/dL (ref 1.5–2.5)

## 2020-02-26 LAB — PSA: PSA: 1.54 ng/mL (ref <=4.0)

## 2020-02-26 LAB — VITAMIN D 25 HYDROXY (VIT D DEFICIENCY, FRACTURES): Vit D, 25-Hydroxy: 34 ng/mL (ref 30–100)

## 2020-02-27 ENCOUNTER — Other Ambulatory Visit: Payer: Self-pay | Admitting: Adult Health Nurse Practitioner

## 2020-02-27 DIAGNOSIS — E785 Hyperlipidemia, unspecified: Secondary | ICD-10-CM

## 2020-02-27 DIAGNOSIS — E559 Vitamin D deficiency, unspecified: Secondary | ICD-10-CM

## 2020-02-27 MED ORDER — FENOFIBRATE MICRONIZED 200 MG PO CAPS: ORAL_CAPSULE | ORAL | 1 refills | Status: DC

## 2020-02-27 MED ORDER — CHOLECALCIFEROL 1.25 MG (50000 UT) PO CAPS: ORAL_CAPSULE | ORAL | 0 refills | Status: DC

## 2020-03-18 ENCOUNTER — Telehealth: Payer: Self-pay

## 2020-03-18 ENCOUNTER — Other Ambulatory Visit: Payer: Self-pay | Admitting: Adult Health Nurse Practitioner

## 2020-03-18 ENCOUNTER — Encounter: Payer: Self-pay | Admitting: Adult Health Nurse Practitioner

## 2020-03-18 DIAGNOSIS — E1142 Type 2 diabetes mellitus with diabetic polyneuropathy: Secondary | ICD-10-CM

## 2020-03-18 MED ORDER — GABAPENTIN 300 MG PO CAPS: 300.0000 mg | ORAL_CAPSULE | Freq: Three times a day (TID) | ORAL | 2 refills | Status: DC

## 2020-03-18 NOTE — Telephone Encounter (Signed)
Patient & his wife have been made aware of information.

## 2020-03-18 NOTE — Telephone Encounter (Signed)
-----   Message from Elder Negus, NP sent at 03/18/2020 11:27 AM EST ----- Regarding: Feet burning  Decrease carbohydrates, white bread, rice pasta, potatoes, and sugars.  This can increase burning in feet related to diabetes.  Check blood glucose to make sure it is not too high, in the 300's constantly, not good.  B12 deficiency is another thing that can cause this.  If not already, take B12 supplement daily.   Alcohol can also increase this symptoms as well as poor circulation.  We will send in a low dose a gabapentin to help with the pain.  Start taking this at night.  THEN you can add an additional dose during the day IF needed.  Do not drive until you know hhow this medication affects you.   Sincerely,           Elder Negus, NP

## 2020-03-24 ENCOUNTER — Ambulatory Visit: Payer: Self-pay | Attending: Internal Medicine

## 2020-03-24 DIAGNOSIS — Z23 Encounter for immunization: Secondary | ICD-10-CM

## 2020-03-24 NOTE — Progress Notes (Signed)
   Covid-19 Vaccination Clinic  Name:  Quasim Doyon    MRN: [id number] DOB: 1955-04-14  03/24/2020  Mr. Delone was observed post Covid-19 immunization for 15 minutes without incident. He was provided with Vaccine Information Sheet and instruction to access the V-Safe system.   Mr. Mceachin was instructed to call 911 with any severe reactions post vaccine: [name] Difficulty breathing  . Swelling of face and throat  . A fast heartbeat  . A bad rash all over body  . Dizziness and weakness   Immunizations Administered    Name Date Dose VIS Date Route   Pfizer COVID-19 Vaccine 03/24/2020  3:01 PM 0.3 mL 01/16/2020 Intramuscular   Manufacturer: [employer], [employer]   Lot: [id number]   NDC: [address]-1

## 2020-04-10 ENCOUNTER — Other Ambulatory Visit: Payer: Self-pay | Admitting: Adult Health Nurse Practitioner

## 2020-04-10 DIAGNOSIS — M51369 Other intervertebral disc degeneration, lumbar region without mention of lumbar back pain or lower extremity pain: Secondary | ICD-10-CM

## 2020-04-10 DIAGNOSIS — M5136 Other intervertebral disc degeneration, lumbar region: Secondary | ICD-10-CM

## 2020-04-10 DIAGNOSIS — M2550 Pain in unspecified joint: Secondary | ICD-10-CM

## 2020-04-10 DIAGNOSIS — M25561 Pain in right knee: Secondary | ICD-10-CM

## 2020-04-10 MED ORDER — MELOXICAM 15 MG PO TABS: ORAL_TABLET | ORAL | 2 refills | Status: DC

## 2020-05-14 ENCOUNTER — Other Ambulatory Visit: Payer: Self-pay | Admitting: Internal Medicine

## 2020-05-21 ENCOUNTER — Other Ambulatory Visit: Payer: Self-pay | Admitting: Adult Health Nurse Practitioner

## 2020-05-21 DIAGNOSIS — E559 Vitamin D deficiency, unspecified: Secondary | ICD-10-CM

## 2020-05-28 ENCOUNTER — Other Ambulatory Visit: Payer: Self-pay | Admitting: Adult Health

## 2020-06-03 ENCOUNTER — Ambulatory Visit: Payer: No Typology Code available for payment source | Admitting: Adult Health Nurse Practitioner

## 2020-06-16 ENCOUNTER — Encounter: Payer: Self-pay | Admitting: Adult Health Nurse Practitioner

## 2020-06-16 ENCOUNTER — Other Ambulatory Visit: Payer: Self-pay

## 2020-06-16 ENCOUNTER — Ambulatory Visit (INDEPENDENT_AMBULATORY_CARE_PROVIDER_SITE_OTHER): Payer: No Typology Code available for payment source | Admitting: Adult Health Nurse Practitioner

## 2020-06-16 VITALS — BP 126/84 | Temp 98.2°F | Ht 77.0 in | Wt 242.0 lb

## 2020-06-16 DIAGNOSIS — E559 Vitamin D deficiency, unspecified: Secondary | ICD-10-CM

## 2020-06-16 DIAGNOSIS — D696 Thrombocytopenia, unspecified: Secondary | ICD-10-CM

## 2020-06-16 DIAGNOSIS — Z122 Encounter for screening for malignant neoplasm of respiratory organs: Secondary | ICD-10-CM

## 2020-06-16 DIAGNOSIS — E1165 Type 2 diabetes mellitus with hyperglycemia: Secondary | ICD-10-CM

## 2020-06-16 DIAGNOSIS — K2100 Gastro-esophageal reflux disease with esophagitis, without bleeding: Secondary | ICD-10-CM

## 2020-06-16 DIAGNOSIS — I10 Essential (primary) hypertension: Secondary | ICD-10-CM | POA: Diagnosis not present

## 2020-06-16 DIAGNOSIS — E785 Hyperlipidemia, unspecified: Secondary | ICD-10-CM

## 2020-06-16 DIAGNOSIS — F3341 Major depressive disorder, recurrent, in partial remission: Secondary | ICD-10-CM

## 2020-06-16 DIAGNOSIS — F17210 Nicotine dependence, cigarettes, uncomplicated: Secondary | ICD-10-CM

## 2020-06-16 NOTE — Patient Instructions (Signed)
   We have placed an order for low dose C screening for lung cancer related to your smoking history.  You will be contacted to schedule this.  Please let us know if you need a referral to Orthopedics regarding your knees.  We will call you in 1-3 days with your lab results OR sooner if necessary.    GENERAL HEALTH GOALS  Know what a healthy weight is for you (roughly BMI <25) and aim to maintain this  Aim for 7+ servings of fruits and vegetables daily  70-80+ fluid ounces of water or unsweet tea for healthy kidneys  Limit to max 1 drink of alcohol per day; avoid smoking/tobacco  Limit animal fats in diet for cholesterol and heart health - choose grass fed whenever available  Avoid highly processed foods, and foods high in saturated/trans fats  Aim for low stress - take time to unwind and care for your mental health  Aim for 150 min of moderate intensity exercise weekly for heart health, and weights twice weekly for bone health  Aim for 7-9 hours of sleep daily

## 2020-06-16 NOTE — Progress Notes (Signed)
FOLLOW UP 3 MONTH   Assessment and Plan:  Jerry Stewart was seen today for complete physical  Diagnoses and all orders for this visit:  Encounter for general adult examination with abnormal findings Yearly   Essential hypertension       Lifestyle controled Monitor blood pressure at home; call if consistently over 130/80 Continue DASH diet.   Reminder to go to the ER if any CP, SOB, nausea, dizziness, severe HA, changes vision/speech, left arm numbness and tingling and jaw pain. -     COMPLETE METABOLIC PANEL WITH GFR -     CBC with Differential/Platelet   Hyperlipidemia, unspecified hyperlipidemia type Continue medications:Lofibra 200mg , trigs elevated last OV Discussed dietary and exercise modifications Low fat diet -     Lipid panel  Gastroesophageal reflux disease with esophagitis Doing well at this time Continue Pantropazole 40mg  daily Diet discussed Monitor for triggers Avoid food with high acid content Avoid excessive cafeine Increase water intake' -     pantoprazole (PROTONIX) 40 MG tablet; Take 1 tablet (40 mg total) by mouth daily as needed.  Type 2 diabetes mellitus with hyperglycemia, without long-term current use of insulin (HCC) Continue medications: MetforminXL 500mg  two tablets BID with meals. Taking glipizide 5mg  half tablet is glucose of 200 or higher. Discussed general issues about diabetes pathophysiology and management. Education: Reviewed 'ABCs' of diabetes management (respective goals in parentheses):  A1C (<7), blood pressure (<130/80), and cholesterol (LDL <70) Dietary recommendations Encouraged aerobic exercise.  Discussed foot care, check daily Yearly retinal exam Dental exam every 6 months Monitor blood glucose, discussed goal for patient -     Hemoglobin A1c  Vitamin D deficiency Continue supplementation to maintain goal of 70-100 Compelted Vitamin D 50,000 IU  Discussed starting Vitamin D3 2,000IU daily -     VITAMIN D 25 Hydroxy (Vit-D  Deficiency, Fractures)  Sarcoidosis Advised to stop smoking Has not gone for chest X-ray, encouraged follow up for this  Thrombocytopenia (HCC) Will continue to monitor -     CBC with Diff  Recurrent major depressive disorder, in partial remission (HCC) Taking Wellbutrin XL150mg  - continue medications, stress management techniques discussed, increase water, good sleep hygiene discussed, increase exercise, and increase veggies.   Obesity (BMI 30.0-34.9) BMI 31 - long discussion about weight loss, diet, and exercise -recommended diet heavy in fruits and veggies and low in animal meats, cheeses, and dairy products  Vitamin D deficiency -     Vitamin D (25 hydroxy) -     Vitamin D, Ergocalciferol, (DRISDOL) 1.25 MG (50000 UT) CAPS capsule; 1 pill 3 days a week for vitamin d deficiency Discussed that def can worsen COVID outcomes, will do vitamin D RX 3 x a week for 3 months and recheck.    Further disposition pending results if labs check today. Discussed med's effects and SE's.   Over 30 minutes of face to face interview, exam, counseling, chart review, and critical decision making was performed.     Future Appointments  Date Time Provider Department Center  02/24/2021  3:00 PM , NP [facility] None    HPI Patient presents for 3 month follow up for HTN, HLD, DMIII, obesity and vitamin D deficiency.  He was having some knee pains and saw rheumatology.recommended, Voltaren gel.  He reports that was not working very well at all.  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 He also has PRN Meloxicam, Plans to contact Rheumatology  for follow up.  Discussed Orthapedics   He currently smokes, and has sarcoidosis, followed with Dr. Shelle Ironlance and has seen Dr. [place]Jordan in the past but it has been years, had visit with Dr. [name] in 02/2018.  Denies SOB but states that he is not moving around as much.  He continues to smoke, smokes  a half a pack a day x 20+ years, he states he can not handle the patches. He is on wellbutrin but states it is not better. he tried chantix but states he could not continue due to side effects.  He is not ready to quit at this time.  He has not had Lung cancer screenig via CT low dose. Discussed with patient will order today.   His blood pressure has been controlled at home, today their BP is BP: 126/84 He does not workout. He denies chest pain, shortness of breath, dizziness.  He is on cholesterol medication, fenofibrate and denies myalgias. His cholesterol is not at goal. The cholesterol last visit was:   Lab Results  Component Value Date   CHOL 193 02/25/2020   HDL 30 (L) 02/25/2020   LDLCALC  02/25/2020     Comment:     . LDL cholesterol not calculated. Triglyceride levels greater than 400 mg/dL invalidate calculated LDL results. . Reference range: <100 . Desirable range <100 mg/dL for primary prevention;   <70 mg/dL for patients with CHD or diabetic patients  with > or = 2 CHD risk factors. [name]. LDL-C is now calculated using the Martin-Hopkins  calculation, which is a validated novel method providing  better accuracy than the Friedewald equation in the  estimation of LDL-C.  Horald PollenMartin SS et al. Lenox AhrJAMA. 4742;595(632013;310(19): 2061-2068  (http://education.[facility].com/faq/FAQ164)    TRIG 403 (H) 02/25/2020   CHOLHDL 6.4 (H) 02/25/2020   He reports that his fasting glucose ranges from 100-110's.   He take glipizide 5mg  unless glucose is 130 or higher. H has not taken this for months.  He also take metformin 500mg  two tablets twice a day.   He has been working on diet and exercise for prediabetes, he is on metformin, and denies paresthesia of the feet, polydipsia and polyuria. Last A1C in the office was:  Lab Results  Component Value Date   HGBA1C 6.0 (H) 02/25/2020   Patient is on Vitamin D supplement.    Lab Results  Component Value Date   VD25OH 34 02/25/2020     Last PSA is   Lab Results  Component Value Date   PSA 1.54 02/25/2020   PSA 1.4 02/20/2019   PSA 1.2 02/09/2018   He has a history of hepatitis C, followed with Dr. Lucas MallowZamar, treated on 04/22/2014, Harvoni.   Has had Hep B vaccine, no longer needs to follow up, will follow up here.   BMI is Body mass index is 28.7 kg/m., he is working on diet and exercise. Wt Readings from Last 3 Encounters:  06/16/20 242 lb (109.8 kg)  02/25/20 243 lb (110.2 kg)  11/20/19 234 lb 12.8 oz (106.5 kg)     Current Medications:  Current Outpatient Medications on File Prior to Visit  Medication Sig Dispense Refill  . buPROPion (WELLBUTRIN XL) 150 MG 24 hr tablet TAKE 1 TABLET BY MOUTH EVERY DAY IN THE MORNING 90 tablet 1  . Cholecalciferol 1.25 MG (50000 UT) capsule Take one tablet by mouth three days a week for twelve weeks. 36 capsule 0  . cyclobenzaprine (FLEXERIL) 10 MG tablet Take 1 tablet (10 mg  total) by mouth at bedtime as needed for muscle spasms. 60 tablet 0  . famotidine (PEPCID) 40 MG tablet TAKE 1 TABLET BY MOUTH EVERY DAY IN THE EVENING 90 tablet 1  . fenofibrate micronized (LOFIBRA) 200 MG capsule Take one tablet daily with food. 90 capsule 1  . gabapentin (NEURONTIN) 300 MG capsule Take 1 capsule (300 mg total) by mouth 3 (three) times daily. 90 capsule 2  . glipiZIDE (GLUCOTROL) 5 MG tablet Take 1/2 tab daily with breakfast if fasting glucose is 130+. Do not take if not eating or having low blood sugars. Take a second 1/2 tab with dinner if fasting remains persistently above 130. 90 tablet 1  . glucose blood test strip Check blood sugar 1 time daily-DX-E11.9 100 each 12  . Lancets (ONETOUCH DELICA PLUS LANCET33G) MISC Check blood sugar 1 time daily-E11.9 100 each 3  . meloxicam (MOBIC) 15 MG tablet Take once daily for 2 weeks, then 1/2-1 tab daily as needed. Can take with tylenol, do not take with ibuprofen or aleve. 30 tablet 2  . metFORMIN (GLUCOPHAGE-XR) 500 MG 24 hr tablet TAKE 2 TABLETS 2 X /DAY WITH  MEALS FOR DIABETES 360 tablet 3  . pantoprazole (PROTONIX) 40 MG tablet Take 1 tablet (40 mg total) by mouth daily as needed. 90 tablet 0   No current facility-administered medications on file prior to visit.   Health Maintenance:  Immunization History  Administered Date(s) Administered  . Hepatitis B 06/24/2014  . Hepatitis B, adult 06/24/2014  . Influenza Inj Mdck Quad With Preservative 02/08/2017, 02/09/2018  . Influenza Whole 01/17/2012  . Influenza, Seasonal, Injecte, Preservative Fre 02/09/2016  . Influenza,inj,Quad PF,6+ Mos 11/05/2018  . Influenza-Unspecified 11/05/2018, 12/23/2019  . PFIZER(Purple Top)SARS-COV-2 Vaccination 06/10/2019, 06/30/2019, 03/24/2020  . Pneumococcal Conjugate-13 12/28/2011  . Pneumococcal Polysaccharide-23 10/03/2012  . Tdap 10/04/2011    Tetanus: 09/2011 Pneumovax: 2014 Prevnar 13: 2013 Flu vaccine: 11/2019 Zostavax/Shingrix Discussed with patient  COVID19 SARS 2: 1/2 on 06/10/19 Hep B: 2016 Colonoscopy: 12/2013 Dr. Christella Hartigan Q10 years Heart cath 04/2013 Hep C: 09/2013 Dr Christella Hartigan  Allergies:  Allergies  Allergen Reactions  . Chantix [Varenicline]     Nausea  . Lopid [Gemfibrozil]     Nausea/weakness   Medical History:  Past Medical History:  Diagnosis Date  . Chest pain 05/16/2012  . Colon polyps   . DDD (degenerative disc disease)   . Diabetes mellitus without complication (HCC)   . Hepatitis C    treated for hepatatis but not sure what type  . Hiatal hernia   . Hypercholesteremia   . Marijuana use   . Osteoarthritis   . Sarcoid   . Thrombocytopenia (HCC)   . Tobacco abuse    SURGICAL HISTORY He  has a past surgical history that includes Appendectomy; HEAD TRAUMA (1975); and left heart catheterization with coronary angiogram (N/A, 05/17/2012). FAMILY HISTORY His family history includes Cancer in his father; Diabetes Mellitus II in his mother; Leukemia in his mother; Other in an other family member; Sarcoidosis in his paternal  aunt and sister. SOCIAL HISTORY He  reports that he has been smoking cigarettes. He has a 15.00 pack-year smoking history. He has never used smokeless tobacco. He reports current alcohol use of about 6.0 standard drinks of alcohol per week. He reports current drug use. Frequency: 2.00 times per week. Drug: Marijuana.  Review of Systems  Constitutional: Negative.  Negative for chills, diaphoresis, fever, malaise/fatigue and weight loss.  HENT: Negative for congestion, ear discharge, ear pain,  hearing loss, nosebleeds, sore throat and tinnitus.   Eyes: Negative.   Respiratory: Negative for cough, shortness of breath and stridor.   Cardiovascular: Negative.  Negative for leg swelling.  Gastrointestinal: Negative.   Genitourinary: Negative.   Musculoskeletal: Negative for back pain, falls, joint pain, myalgias and neck pain.  Skin: Negative.   Neurological: Negative.  Negative for weakness and headaches.  Psychiatric/Behavioral: Negative.      Physical Exam: Estimated body mass index is 28.7 kg/m as calculated from the following:   Height as of this encounter: 6\' 5"  (1.956 m).   Weight as of this encounter: 242 lb (109.8 kg). BP 126/84   Temp 98.2 F (36.8 C)   Ht 6\' 5"  (1.956 m)   Wt 242 lb (109.8 kg)   BMI 28.70 kg/m    General Appearance: Obese, in no apparent distress.  Eyes: PERRLA, EOMs, conjunctiva no swelling or erythema, normal fundi and vessels.   Neck: Supple, thyroid normal. No bruits  Respiratory: Respiratory effort normal, CTA-B,  without rales, rhonchi, or stridor.  Cardio: RRR without murmurs, rubs or gallops. Brisk peripheral pulses without edema.  Chest: symmetric, with normal excursions and percussion.  Abdomen: Soft, nontender, obese no guarding, rebound, hernias, masses, or organomegaly. .  Lymphatics: Non tender without lymphadenopathy.  Genitourinary: defer Musculoskeletal: Full ROM all peripheral extremities,5/5 strength, and antalgic gait.  Neurological  Exam: normal  Vascular Exam: normal. Skin: Warm, dry without rashes, lesions, ecchymosis. Neuro: Cranial nerves intact, reflexes equal bilaterally. Normal muscle tone, no cerebellar symptoms. Psych: Awake and oriented X 3, normal affect, Insight and Judgment appropriate.     , , DNP [facility] 06/16/2020  9:54 AM

## 2020-06-17 LAB — CBC WITH DIFFERENTIAL/PLATELET
Absolute Monocytes: 447 cells/uL (ref 200–950)
Basophils Absolute: 58 cells/uL (ref 0–200)
Basophils Relative: 1 %
Eosinophils Absolute: 220 cells/uL (ref 15–500)
Eosinophils Relative: 3.8 %
HCT: 45.8 % (ref 38.5–50.0)
Hemoglobin: 14.9 g/dL (ref 13.2–17.1)
Lymphs Abs: 1433 cells/uL (ref 850–3900)
MCH: 28.2 pg (ref 27.0–33.0)
MCHC: 32.5 g/dL (ref 32.0–36.0)
MCV: 86.6 fL (ref 80.0–100.0)
MPV: 10.5 fL (ref 7.5–12.5)
Monocytes Relative: 7.7 %
Neutro Abs: 3642 cells/uL (ref 1500–7800)
Neutrophils Relative %: 62.8 %
Platelets: 229 10*3/uL (ref 140–400)
RBC: 5.29 10*6/uL (ref 4.20–5.80)
RDW: 12.6 % (ref 11.0–15.0)
Total Lymphocyte: 24.7 %
WBC: 5.8 10*3/uL (ref 3.8–10.8)

## 2020-06-17 LAB — COMPLETE METABOLIC PANEL WITH GFR
AG Ratio: 1.7 (calc) (ref 1.0–2.5)
ALT: 20 U/L (ref 9–46)
AST: 16 U/L (ref 10–35)
Albumin: 4.3 g/dL (ref 3.6–5.1)
Alkaline phosphatase (APISO): 73 U/L (ref 35–144)
BUN: 15 mg/dL (ref 7–25)
CO2: 23 mmol/L (ref 20–32)
Calcium: 9.7 mg/dL (ref 8.6–10.3)
Chloride: 107 mmol/L (ref 98–110)
Creat: 0.97 mg/dL (ref 0.70–1.25)
GFR, Est African American: 97 mL/min/{1.73_m2} (ref >=60)
GFR, Est Non African American: 83 mL/min/{1.73_m2} (ref >=60)
Globulin: 2.6 g/dL (calc) (ref 1.9–3.7)
Glucose, Bld: 165 mg/dL — ABNORMAL HIGH (ref 65–99)
Potassium: 4.6 mmol/L (ref 3.5–5.3)
Sodium: 141 mmol/L (ref 135–146)
Total Bilirubin: 0.5 mg/dL (ref 0.2–1.2)
Total Protein: 6.9 g/dL (ref 6.1–8.1)

## 2020-06-17 LAB — VITAMIN D 25 HYDROXY (VIT D DEFICIENCY, FRACTURES): Vit D, 25-Hydroxy: 50 ng/mL (ref 30–100)

## 2020-06-17 LAB — HEMOGLOBIN A1C
Hgb A1c MFr Bld: 5.9 % of total Hgb — ABNORMAL HIGH (ref <5.7)
Mean Plasma Glucose: 123 mg/dL
eAG (mmol/L): 6.8 mmol/L

## 2020-06-17 LAB — LIPID PANEL
Cholesterol: 160 mg/dL (ref <200)
HDL: 31 mg/dL — ABNORMAL LOW (ref >=40)
LDL Cholesterol (Calc): 91 mg/dL (calc)
Non-HDL Cholesterol (Calc): 129 mg/dL (calc) (ref <130)
Total CHOL/HDL Ratio: 5.2 (calc) — ABNORMAL HIGH (ref <5.0)
Triglycerides: 286 mg/dL — ABNORMAL HIGH (ref <150)

## 2020-07-04 ENCOUNTER — Other Ambulatory Visit: Payer: Self-pay

## 2020-07-04 ENCOUNTER — Ambulatory Visit
Admission: RE | Admit: 2020-07-04 | Discharge: 2020-07-04 | Disposition: A | Discharge status: Home / Self Care | Payer: No Typology Code available for payment source | Source: Ambulatory Visit | Attending: Adult Health Nurse Practitioner | Admitting: Adult Health Nurse Practitioner

## 2020-07-04 DIAGNOSIS — F17210 Nicotine dependence, cigarettes, uncomplicated: Secondary | ICD-10-CM

## 2020-07-04 DIAGNOSIS — Z122 Encounter for screening for malignant neoplasm of respiratory organs: Secondary | ICD-10-CM

## 2020-07-05 ENCOUNTER — Encounter: Payer: Self-pay | Admitting: Adult Health Nurse Practitioner

## 2020-07-05 NOTE — Progress Notes (Unsigned)
CT Chest, lung cancer screening results: Electronically Signed   By: Kennith Center M.D.   On: 07/05/2020 16:16  IMPRESSION: 1. Lung-RADS 2, benign appearance or behavior. Continue annual screening with low-dose chest CT without contrast in 12 months. 2. Calcified lymphadenopathy in the chest, stable where visualized on previous abdomen CT of 08/12/2010 and consistent with sarcoid. 3. Emphysema (ICD10-J43.9).-New this image   Elder Negus, Edrick Oh, DNP [facility] 07/05/2020  4:45 PM

## 2020-08-23 ENCOUNTER — Other Ambulatory Visit: Payer: Self-pay | Admitting: Adult Health Nurse Practitioner

## 2020-08-23 DIAGNOSIS — Z720 Tobacco use: Secondary | ICD-10-CM

## 2020-10-09 ENCOUNTER — Other Ambulatory Visit: Payer: Self-pay | Admitting: Adult Health Nurse Practitioner

## 2020-10-09 DIAGNOSIS — M2550 Pain in unspecified joint: Secondary | ICD-10-CM

## 2020-10-09 DIAGNOSIS — M25561 Pain in right knee: Secondary | ICD-10-CM

## 2020-10-09 DIAGNOSIS — M5136 Other intervertebral disc degeneration, lumbar region: Secondary | ICD-10-CM

## 2020-11-26 ENCOUNTER — Other Ambulatory Visit: Payer: Self-pay | Admitting: Adult Health Nurse Practitioner

## 2020-11-26 DIAGNOSIS — E785 Hyperlipidemia, unspecified: Secondary | ICD-10-CM

## 2020-12-17 ENCOUNTER — Ambulatory Visit
Admission: RE | Admit: 2020-12-17 | Discharge: 2020-12-17 | Disposition: A | Discharge status: Home / Self Care | Payer: Commercial Managed Care - PPO | Source: Ambulatory Visit | Attending: Adult Health | Admitting: Adult Health

## 2020-12-17 ENCOUNTER — Encounter: Payer: Self-pay | Admitting: Adult Health

## 2020-12-17 ENCOUNTER — Other Ambulatory Visit: Payer: Self-pay

## 2020-12-17 ENCOUNTER — Ambulatory Visit (INDEPENDENT_AMBULATORY_CARE_PROVIDER_SITE_OTHER): Payer: Commercial Managed Care - PPO | Admitting: Adult Health

## 2020-12-17 VITALS — BP 120/74 | HR 85 | Temp 97.7°F | Wt 226.0 lb

## 2020-12-17 DIAGNOSIS — M545 Low back pain, unspecified: Secondary | ICD-10-CM

## 2020-12-17 MED ORDER — PREDNISONE 20 MG PO TABS: ORAL_TABLET | ORAL | 0 refills | Status: DC

## 2020-12-17 NOTE — Progress Notes (Signed)
Assessment and Plan:  Jerry Stewart was seen today for back pain.  Diagnoses and all orders for this visit:  Lumbar back pain - negative straight leg Prednisone was prescribed, NSAIDs, RICE, and exercise given If not better follow up in office or will refer to PT/orthopedics. Natural history and expected course discussed. Questions answered. Agricultural engineer distributed. Proper lifting, bending technique discussed. Stretching exercises discussed. Short (2-4 day) period of relative rest recommended until acute symptoms improve. Heat to affected area as needed for local pain relief. Lumbar xray as ordered.  Follow up if not improving in 2 weeks or new sx/concerns. PT referral if needed.  -     DG Lumbar Spine Complete; Future -     predniSONE (DELTASONE) 20 MG tablet; 2 tablets daily for 3 days, 1 tablet daily for 4 days.  Further disposition pending results of labs. Discussed med's effects and SE's.   Over 20 minutes of exam, counseling, chart review, and critical decision making was performed.   Future Appointments  Date Time Provider Department Center  02/24/2021  3:00 PM Revonda Humphrey, NP [facility] None    ------------------------------------------------------------------------------------------------------------------   HPI BP 120/74   Pulse 85   Temp 97.7 F (36.5 C)   Wt 226 lb (102.5 kg)   SpO2 99%   BMI 26.80 kg/m  65 y.o.male smoker presents for evaluation of 1 week of back pain.   He reports he started noting left lower back pain 5-6 days ago while at work (bending over to lift foam blocks). Since then he reports pain has been progressively getting worse. Denies midline pain, mainly lumbar left. Progressive and now constant. Describes burning/aching pain. Denies radiation. Denies numbness/paresthesias/weakness of extremities, saddle anesthesia, bladder/bowel changes, nausea, abdominal pain. He reports movement makes worse (twisting, bending). He reports has taken  meloxicam 15 mg daily without significant benefit. Pain improves with resting supine.   He reports hx of lumbar disc disease remotely with intermittent flares. No recent imaging. Denies injury or recent event prior to onset of pain.   Past Medical History:  Diagnosis Date   Chest pain 05/16/2012   Colon polyps    DDD (degenerative disc disease)    Diabetes mellitus without complication (HCC)    Hepatitis C    treated for hepatatis but not sure what type   Hiatal hernia    Hypercholesteremia    Marijuana use    Osteoarthritis    Sarcoid    Thrombocytopenia (HCC)    Tobacco abuse      Allergies  Allergen Reactions   Chantix [Varenicline]     Nausea   Lopid [Gemfibrozil]     Nausea/weakness    Current Outpatient Medications on File Prior to Visit  Medication Sig   buPROPion (WELLBUTRIN XL) 150 MG 24 hr tablet Take 1 tablet  every Morning  for Mood, Focus & Concentration   Cholecalciferol 1.25 MG (50000 UT) capsule Take one tablet by mouth three days a week for twelve weeks.   famotidine (PEPCID) 40 MG tablet TAKE 1 TABLET BY MOUTH EVERY DAY IN THE EVENING   fenofibrate micronized (LOFIBRA) 200 MG capsule TAKE 1 CAPSULE DAILY ORALLY WITH FOOD   glipiZIDE (GLUCOTROL) 5 MG tablet Take 1/2 tab daily with breakfast if fasting glucose is 130+. Do not take if not eating or having low blood sugars. Take a second 1/2 tab with dinner if fasting remains persistently above 130.   glucose blood test strip Check blood sugar 1 time daily-DX-E11.9   Lancets [facility]  DELICA PLUS LANCET33G) MISC Check blood sugar 1 time daily-E11.9   meloxicam (MOBIC) 15 MG tablet TAKE 1 DAILY FOR 2 WKS, THEN 1/2-1 TAB DAILY AS NEEDED. CAN TAKE WITH TYLENOL, NO IBUPROFEN/ALEVE.   metFORMIN (GLUCOPHAGE-XR) 500 MG 24 hr tablet TAKE 2 TABLETS 2 X /DAY WITH MEALS FOR DIABETES   cyclobenzaprine (FLEXERIL) 10 MG tablet Take 1 tablet (10 mg total) by mouth at bedtime as needed for muscle spasms. (Patient not taking:  Reported on 12/17/2020)   gabapentin (NEURONTIN) 300 MG capsule Take 1 capsule (300 mg total) by mouth 3 (three) times daily. (Patient not taking: Reported on 12/17/2020)   pantoprazole (PROTONIX) 40 MG tablet Take 1 tablet (40 mg total) by mouth daily as needed.   No current facility-administered medications on file prior to visit.    ROS: all negative except above.   Physical Exam:  BP 120/74   Pulse 85   Temp 97.7 F (36.5 C)   Wt 226 lb (102.5 kg)   SpO2 99%   BMI 26.80 kg/m   General Appearance: Well nourished, well dressed AA male in no apparent distress. Eyes: conjunctiva no swelling or erythema ENT/Mouth: Mask in place; Hearing normal.  Neck: Supple Respiratory: Respiratory effort normal, BS equal bilaterally without rales, rhonchi, wheezing or stridor.  Cardio: RRR with no MRGs. Brisk peripheral pulses without edema.  Abdomen: Soft, + BS.  Non tender, no guarding, rebound, masses. He has umbilical and ventral hernia without tenderness.  Lymphatics: Non tender without lymphadenopathy.  Musculoskeletal: Patient is able to ambulate well. Gait is not  Antalgic. Straight leg raising with dorsiflexion negative bilaterally for radicular symptoms. Sensory exam in the legs are normal. Knee reflexes are normal Ankle reflexes are normal Strength is normal and symmetric in arms and legs. There is not SI tenderness to palpation.  There is not paraspinal muscle spasm.  There is not midline tenderness.  ROM of spine with  limited in flexion and right rotation due to pain.  Skin: Warm, dry without rashes, lesions, ecchymosis.  Neuro: Normal muscle tone, Sensation intact.  Psych: Awake and oriented X 3, normal affect, Insight and Judgment appropriate.     Jerry Maker, NP 11:38 AM [facility]

## 2020-12-17 NOTE — Patient Instructions (Addendum)
To get your xray:  Please get your chest xray at [address] imaging center, can walk in without an appointment M-F, 8-4pm    Acute Back Pain, Adult Acute back pain is sudden and usually short-lived. It is often caused by an injury to the muscles and tissues in the back. The injury may result from: A muscle, tendon, or ligament getting overstretched or torn. Ligaments are tissues that connect bones to each other. Lifting something improperly can cause a back strain. Wear and tear (degeneration) of the spinal disks. Spinal disks are circular tissue that provide cushioning between the bones of the spine (vertebrae). Twisting motions, such as while playing sports or doing yard work. A hit to the back. Arthritis. You may have a physical exam, lab tests, and imaging tests to find the cause of your pain. Acute back pain usually goes away with rest and home care. Follow these instructions at home: Managing pain, stiffness, and swelling Take over-the-counter and prescription medicines only as told by your health care provider. Treatment may include medicines for pain and inflammation that are taken by mouth or applied to the skin, or muscle relaxants. Your health care provider may recommend applying ice during the first 24-48 hours after your pain starts. To do this: Put ice in a plastic bag. Place a towel between your skin and the bag. Leave the ice on for 20 minutes, 2-3 times a day. Remove the ice if your skin turns bright red. This is very important. If you cannot feel pain, heat, or cold, you have a greater risk of damage to the area. If directed, apply heat to the affected area as often as told by your health care provider. Use the heat source that your health care provider recommends, such as a moist heat pack or a heating pad. Place a towel between your skin and the heat source. Leave the heat on for 20-30 minutes. Remove the heat if your skin turns bright red. This is especially  important if you are unable to feel pain, heat, or cold. You have a greater risk of getting burned. Activity  Do not stay in bed. Staying in bed for more than 1-2 days can delay your recovery. Sit up and stand up straight. Avoid leaning forward when you sit or hunching over when you stand. If you work at a desk, sit close to it so you do not need to lean over. Keep your chin tucked in. Keep your neck drawn back, and keep your elbows bent at a 90-degree angle (right angle). Sit high and close to the steering wheel when you drive. Add lower back (lumbar) support to your car seat, if needed. Take short walks on even surfaces as soon as you are able. Try to increase the length of time you walk each day. Do not sit, drive, or stand in one place for more than 30 minutes at a time. Sitting or standing for long periods of time can put stress on your back. Do not drive or use heavy machinery while taking prescription pain medicine. Use proper lifting techniques. When you bend and lift, use positions that put less stress on your back: [place] your knees. Keep the load close to your body. Avoid twisting. Exercise regularly as told by your health care provider. Exercising helps your back heal faster and helps prevent back injuries by keeping muscles strong and flexible. Work with a physical therapist to make a safe exercise program, as recommended by your health care provider.  Do any exercises as told by your physical therapist. Lifestyle Maintain a healthy weight. Extra weight puts stress on your back and makes it difficult to have good posture. Avoid activities or situations that make you feel anxious or stressed. Stress and anxiety increase muscle tension and can make back pain worse. Learn ways to manage anxiety and stress, such as through exercise. General instructions Sleep on a firm mattress in a comfortable position. Try lying on your side with your knees slightly bent. If you lie on your back, put a  pillow under your knees. Keep your head and neck in a straight line with your spine (neutral position) when using electronic equipment like smartphones or pads. To do this: Raise your smartphone or pad to look at it instead of bending your head or neck to look down. Put the smartphone or pad at the level of your face while looking at the screen. Follow your treatment plan as told by your health care provider. This may include: Cognitive or behavioral therapy. Acupuncture or massage therapy. Meditation or yoga. Contact a health care provider if: You have pain that is not relieved with rest or medicine. You have increasing pain going down into your legs or buttocks. Your pain does not improve after 2 weeks. You have pain at night. You lose weight without trying. You have a fever or chills. You develop nausea or vomiting. You develop abdominal pain. Get help right away if: You develop new bowel or bladder control problems. You have unusual weakness or numbness in your arms or legs. You feel faint. These symptoms may represent a serious problem that is an emergency. Do not wait to see if the symptoms will go away. Get medical help right away. Call your local emergency services (911 in the [place] Do not drive yourself to the hospital. Summary Acute back pain is sudden and usually short-lived. Use proper lifting techniques. When you bend and lift, use positions that put less stress on your back. Take over-the-counter and prescription medicines only as told by your health care provider, and apply heat or ice as told. This information is not intended to replace advice given to you by your health care provider. Make sure you discuss any questions you have with your health care provider. Document Revised: 06/06/2020 Document Reviewed: 06/06/2020 Elsevier Patient Education  2022 [employer].

## 2021-01-25 ENCOUNTER — Other Ambulatory Visit: Payer: Self-pay | Admitting: Internal Medicine

## 2021-01-25 DIAGNOSIS — Z720 Tobacco use: Secondary | ICD-10-CM

## 2021-01-28 ENCOUNTER — Other Ambulatory Visit: Payer: Self-pay | Admitting: Nurse Practitioner

## 2021-01-28 DIAGNOSIS — M2550 Pain in unspecified joint: Secondary | ICD-10-CM

## 2021-01-28 DIAGNOSIS — M25561 Pain in right knee: Secondary | ICD-10-CM

## 2021-01-28 DIAGNOSIS — M5136 Other intervertebral disc degeneration, lumbar region: Secondary | ICD-10-CM

## 2021-02-23 NOTE — Progress Notes (Deleted)
COMPLETE PHYSICAL   Assessment and Plan:  Reis was seen today for complete physical  Diagnoses and all orders for this visit:  Encounter for general adult examination with abnormal findings Yearly   Essential hypertension       Lifestyle controled Monitor blood pressure at home; call if consistently over 130/80 Continue DASH diet.   Reminder to go to the ER if any CP, SOB, nausea, dizziness, severe HA, changes vision/speech, left arm numbness and tingling and jaw pain. -     COMPLETE METABOLIC PANEL WITH GFR -     CBC with Differential/Platelet   Hyperlipidemia, unspecified hyperlipidemia type Continue medications:Lofibra Discussed dietary and exercise modifications Low fat diet -     Lipid panel  Gastroesophageal reflux disease with esophagitis Doing well at this time Continue Pantropazole 40mg  daily Diet discussed Monitor for triggers Avoid food with high acid content Avoid excessive cafeine Increase water intake' -     pantoprazole (PROTONIX) 40 MG tablet; Take 1 tablet (40 mg total) by mouth daily as needed.  Type 2 diabetes mellitus with hyperglycemia, without long-term current use of insulin (HCC) Continue medications: MetforminXL 500mg  two tablets BID with meals. Taking glipizide 5mg  half tablet is glucsose over 200 Discussed general issues about diabetes pathophysiology and management. Education: Reviewed 'ABCs' of diabetes management (respective goals in parentheses):  A1C (<7), blood pressure (<130/80), and cholesterol (LDL <70) Dietary recommendations Encouraged aerobic exercise.  Discussed foot care, check daily Yearly retinal exam Dental exam every 6 months Monitor blood glucose, discussed goal for patient -     Hemoglobin A1c  Vitamin D deficiency Continue supplementation to maintain goal of 70-100 Taking Vitamin D 50,000 IU daily three days a week, completed and did not start OTC -     VITAMIN D 25 Hydroxy (Vit-D Deficiency,  Fractures)  Sarcoidosis Advised to stop smoking Has not gone for chest X-ray, encouraged follow up for this  Thrombocytopenia (HCC) Will continue to monitor -     CBC with Diff  Recurrent major depressive disorder, in partial remission (HCC) Restart Wellbutrin XL150mg  - continue medications, stress management techniques discussed, increase water, good sleep hygiene discussed, increase exercise, and increase veggies.   Obesity (BMI 30.0-34.9) BMI 31 - long discussion about weight loss, diet, and exercise -recommended diet heavy in fruits and veggies and low in animal meats, cheeses, and dairy products  Vitamin D deficiency -     Vitamin D (25 hydroxy) -     Vitamin D, Ergocalciferol, (DRISDOL) 1.25 MG (50000 UT) CAPS capsule; 1 pill 3 days a week for vitamin d deficiency Discussed that def can worsen COVID outcomes, will do vitamin D RX 3 x a week for 3 months and recheck.   Screening urine blood or protein -UA  Screening ischemic heart disease -EKG  Screening PSA -PSA  Medication Management Continued    Further disposition pending results if labs check today. Discussed med's effects and SE's.   Over 40 minutes of face to face interview, exam, counseling, chart review, and critical decision making was performed.     Future Appointments  Date Time Provider Department Center  02/24/2021  3:00 PM , NP [facility] None    HPI Patient presents for 3 month follow up for HTN, HLD, DMIII, obesity and vitamin D deficiency.  Reports that he is having some left sided back pain.  It in intermittent in nature.  He has noticed it when he coughs or positional.  Reports it is sharp short burst that  is pins/needle like. Reports it is localized to the area.  He has not had anything like this in the past.    He was having some knee pains and rheumatologist recommended Voltaren gel.  He smokes and has sarcoidosis, followed with Dr. Shelle Iron and has seen Dr. [place] in the  past but it has been years, had visit with Dr. Everardo All in 02/2018.  Denies SOB but states that he is not moving around as much.  He continues to smoke, smokes a half a pack a day x 20 years, he states he can not handle the patches. He is on wellbutrin but states it is not better. he tried chantix but states he could not continue due to side effects.  He is not ready to quit at this tim.    His blood pressure has been controlled at home, today their BP is   He does not workout. He denies chest pain, shortness of breath, dizziness.  He is on cholesterol medication, fenofibrate and denies myalgias. His cholesterol is not at goal. The cholesterol last visit was:   Lab Results  Component Value Date   CHOL 160 06/16/2020   HDL 31 (L) 06/16/2020   LDLCALC 91 06/16/2020   TRIG 286 (H) 06/16/2020   CHOLHDL 5.2 (H) 06/16/2020   He reports that his fasting glucose ranges from 100-130's.  He had one that was 200 the other day.  He take glipazide if it is 200 or higher.    He has been working on diet and exercise for prediabetes, he is on metformin, and denies paresthesia of the feet, polydipsia and polyuria. Last A1C in the office was:  Lab Results  Component Value Date   HGBA1C 5.9 (H) 06/16/2020   Patient is on Vitamin D supplement.   Lab Results  Component Value Date   VD25OH 50 06/16/2020     Last PSA is  Lab Results  Component Value Date   PSA 1.54 02/25/2020   PSA 1.4 02/20/2019   PSA 1.2 02/09/2018   He has a history of hepatitis C, followed with Dr. Lucas Mallow, treated on 04/22/2014, Harvoni.  Has had Hep B vaccine, no longer needs to follow up, will follow up here.   BMI is There is no height or weight on file to calculate BMI., he is working on diet and exercise. Wt Readings from Last 3 Encounters:  12/17/20 226 lb (102.5 kg)  06/16/20 242 lb (109.8 kg)  02/25/20 243 lb (110.2 kg)     Current Medications:  Current Outpatient Medications on File Prior to Visit  Medication Sig  Dispense Refill   buPROPion (WELLBUTRIN XL) 150 MG 24 hr tablet TAKE 1 TABLET EVERY MORNING FOR MOOD, FOCUS & CONCENTRATION 90 tablet 3   Cholecalciferol 1.25 MG (50000 UT) capsule Take one tablet by mouth three days a week for twelve weeks. 36 capsule 0   cyclobenzaprine (FLEXERIL) 10 MG tablet Take 1 tablet (10 mg total) by mouth at bedtime as needed for muscle spasms. (Patient not taking: Reported on 12/17/2020) 60 tablet 0   famotidine (PEPCID) 40 MG tablet TAKE 1 TABLET BY MOUTH EVERY DAY IN THE EVENING 90 tablet 1   fenofibrate micronized (LOFIBRA) 200 MG capsule TAKE 1 CAPSULE DAILY ORALLY WITH FOOD 90 capsule 1   gabapentin (NEURONTIN) 300 MG capsule Take 1 capsule (300 mg total) by mouth 3 (three) times daily. (Patient not taking: Reported on 12/17/2020) 90 capsule 2   glipiZIDE (GLUCOTROL) 5 MG tablet Take 1/2 tab  daily with breakfast if fasting glucose is 130+. Do not take if not eating or having low blood sugars. Take a second 1/2 tab with dinner if fasting remains persistently above 130. 90 tablet 1   glucose blood test strip Check blood sugar 1 time daily-DX-E11.9 100 each 12   Lancets (ONETOUCH DELICA PLUS LANCET33G) MISC Check blood sugar 1 time daily-E11.9 100 each 3   meloxicam (MOBIC) 15 MG tablet TAKE 1 DAILY FOR 2 WKS, THEN 1/2-1 TAB DAILY AS NEEDED. CAN TAKE WITH TYLENOL, NO IBUPROFEN/ALEVE. 30 tablet 2   metFORMIN (GLUCOPHAGE-XR) 500 MG 24 hr tablet TAKE 2 TABLETS 2 X /DAY WITH MEALS FOR DIABETES 360 tablet 3   pantoprazole (PROTONIX) 40 MG tablet Take 1 tablet (40 mg total) by mouth daily as needed. 90 tablet 0   predniSONE (DELTASONE) 20 MG tablet 2 tablets daily for 3 days, 1 tablet daily for 4 days. 10 tablet 0   No current facility-administered medications on file prior to visit.   Health Maintenance:  Immunization History  Administered Date(s) Administered   Hepatitis B 06/24/2014   Hepatitis B, adult 06/24/2014   Influenza Inj Mdck Quad Pf 01/03/2021   Influenza Inj  Mdck Quad With Preservative 02/08/2017, 02/09/2018   Influenza Whole 01/17/2012   Influenza, Seasonal, Injecte, Preservative Fre 02/09/2016   Influenza,inj,Quad PF,6+ Mos 11/05/2018   Influenza-Unspecified 11/05/2018, 12/23/2019   PFIZER(Purple Top)SARS-COV-2 Vaccination 06/10/2019, 06/30/2019, 03/24/2020   Pneumococcal Conjugate-13 12/28/2011   Pneumococcal Polysaccharide-23 10/03/2012   Tdap 10/04/2011    Tetanus: 09/2011 Pneumovax: 2014 Prevnar 13: 2013 Flu vaccine: 11/2019 Zostavax/Shingrix Discussed with patient  COVID19 SARS 2: 1/2 on 06/10/19 Hep B: 2016 Colonoscopy: 12/2013 Dr. Christella Hartigan Q10 years Heart cath 04/2013 Hep C: 09/2013 Dr Christella Hartigan  Allergies:  Allergies  Allergen Reactions   Chantix [Varenicline]     Nausea   Lopid [Gemfibrozil]     Nausea/weakness   Medical History:  Past Medical History:  Diagnosis Date   Chest pain 05/16/2012   Colon polyps    DDD (degenerative disc disease)    Diabetes mellitus without complication (HCC)    Hepatitis C    treated for hepatatis but not sure what type   Hiatal hernia    Hypercholesteremia    Marijuana use    Osteoarthritis    Sarcoid    Thrombocytopenia (HCC)    Tobacco abuse    SURGICAL HISTORY He  has a past surgical history that includes Appendectomy; HEAD TRAUMA (1975); and left heart catheterization with coronary angiogram (N/A, 05/17/2012). FAMILY HISTORY His family history includes Cancer in his father; Diabetes Mellitus II in his mother; Leukemia in his mother; Other in an other family member; Sarcoidosis in his paternal aunt and sister. SOCIAL HISTORY He  reports that he has been smoking cigarettes. He has a 15.00 pack-year smoking history. He has never used smokeless tobacco. He reports current alcohol use of about 6.0 standard drinks per week. He reports current drug use. Frequency: 2.00 times per week. Drug: Marijuana.  Review of Systems  Constitutional: Negative.  Negative for chills, diaphoresis,  fever, malaise/fatigue and weight loss.  HENT:  Negative for congestion, ear discharge, ear pain, hearing loss, nosebleeds, sinus pain, sore throat and tinnitus.   Eyes: Negative.  Negative for blurred vision and double vision.  Respiratory:  Negative for cough, hemoptysis, sputum production, shortness of breath, wheezing and stridor.   Cardiovascular: Negative.  Negative for chest pain, palpitations and leg swelling.  Gastrointestinal: Negative.  Negative for abdominal pain, constipation,  diarrhea, heartburn, nausea and vomiting.  Genitourinary: Negative.  Negative for dysuria and urgency.  Musculoskeletal:  Negative for back pain, falls, joint pain, myalgias and neck pain.  Skin: Negative.  Negative for rash.  Neurological: Negative.  Negative for dizziness, tingling, tremors, weakness and headaches.  Endo/Heme/Allergies:  Does not bruise/bleed easily.  Psychiatric/Behavioral: Negative.  Negative for depression and suicidal ideas. The patient is not nervous/anxious and does not have insomnia.     Physical Exam: Estimated body mass index is 26.8 kg/m as calculated from the following:   Height as of 06/16/20: 6\' 5"  (1.956 m).   Weight as of 12/17/20: 226 lb (102.5 kg). There were no vitals taken for this visit. General Appearance: Obese, in no apparent distress.  Eyes: PERRLA, EOMs, conjunctiva no swelling or erythema, normal fundi and vessels.   Neck: Supple, thyroid normal. No bruits  Respiratory: Respiratory effort normal, CTA-B,  without rales, rhonchi, or stridor.  Cardio: RRR without murmurs, rubs or gallops. Brisk peripheral pulses without edema.  Chest: symmetric, with normal excursions and percussion.  Abdomen: Soft, nontender, obese no guarding, rebound, hernias, masses, or organomegaly. .  Lymphatics: Non tender without lymphadenopathy.  Genitourinary: defer Musculoskeletal: Full ROM all peripheral extremities,5/5 strength, and antalgic gait.  Neurological Exam: normal   Vascular Exam: normal. Skin: Warm, dry without rashes, lesions, ecchymosis. Neuro: Cranial nerves intact, reflexes equal bilaterally. Normal muscle tone, no cerebellar symptoms. Psych: Awake and oriented X 3, normal affect, Insight and Judgment appropriate.    Revonda Humphrey, NP 5:09 PM [facility]

## 2021-02-24 ENCOUNTER — Encounter: Payer: No Typology Code available for payment source | Admitting: Nurse Practitioner

## 2021-03-25 NOTE — Progress Notes (Signed)
Future Appointments  Date Time Provider Department  03/26/2021  9:30 AM Jerry Cowboy, MD [facility]  04/02/2021  9:00 AM Jerry Humphrey, NP [facility]    History of Present Illness:   Patient is a very nice  65  yo  MBM with  HTN, HLD, T2_NIDDM    who presents with a     prodrome of head & chest congestion. C/o HA, ear/sinus /chest  pressure congestion   Medications    glipiZIDE (GLUCOTROL) 5 MG tablet, Take 1/2 tab daily with breakfast if fasting glucose is 130+. Take a second 1/2 tab with dinner if fasting remains persistently above 130.   metFORMIN-XR 500 MG, TAKE 2 TABLETS 2 X /DAY     fenofibrate micronized (LOFIBRA) 200 MG capsule, TAKE 1 CAPSULE DAILY ORALLY WITH FOOD   meloxicam (MOBIC) 15 MG tablet, TAKE 1 DAILY FOR 2 WKS, THEN 1/2-1 TAB DAILY AS NEEDED. CAN TAKE WITH TYLENOL, NO IBUPROFEN/ALEVE.   buPROPion  XL 150 MG , TAKE 1 TABLET EVERY MORNING   Cholecalciferol 1.25 MG (50000 UT) capsule, Take one tablet three days a week for twelve weeks.   famotidine (PEPCID) 40 MG tablet, TAKE 1 TABLET BY MOUTH EVERY DAY IN THE EVENING   pantoprazole (PROTONIX) 40 MG tablet, Take 1 tablet (40 mg total) by mouth daily as needed.  Problem list He has Sarcoidosis; Hyperlipidemia; Overweight; Hepatitis C; Tobacco abuse; Hiatal hernia; Thrombocytopenia (HCC); Colon polyps; DDD (degenerative disc disease), lumbar; Essential hypertension; Medication management; Depression; Acid reflux; Umbilical hernia without obstruction and without gangrene; Abnormal glucose; and Vitamin D deficiency on their problem list.   Observations/Objective:  BP (!) 144/78    Pulse 74    Temp 98 F (36.7 C)    Resp 18    Ht 6\' 5"  (1.956 m)    Wt 237 lb (107.5 kg)    SpO2 97%    BMI 28.10 kg/m   No cyanosis, icterus, rash, or stridor.  HEENT  -    EACs - patent. TMs - clear dull  retracted   pink   Nl color.   N/O/P    -    Clear  -injected  -1-2 (+) .No exudate  Neck     -    Supple. No sig Cx LN's.     Chest    -    BS equal   with few scattered rales  & rhonchi .  No wheezes  Cor - Nl HS. RRR w/o sig MGR. PP 1(+). No edema. MS- FROM w/o deformities.  Gait Nl. Neuro -  Nl w/o focal abnormalities.  Assessment and Plan:  1. Pharyngitis  - azithromycin 250 MG tablet;  Take 2 tablets with Food on  Day 1, then 1 tablet Daily    Dispense: 6 each; Refill: 1  2. Bronchitis  - dexamethasone  4 MG tablet;  Take 1 tab 3 x day - 3 days, then 2 x day - 3 days, then 1 tab daily   Dispense: 20 tablet;   - promethazine-DM) 6.25-15 MG/5ML syrup;  Take 1 tsp every 4 hours if needed for cough   Dispense: 240 mL; Refill: 1  - azithromycin 250 MG tablet;  Take 2 tablets with Food on  Day 1, then 1 tablet Daily   Dispense: 6 each; Refill: 1  3. Cough headache  - POC COVID-19  -> Negative  - POC Influenza A&B (Binax test)  - >   Negative   Follow Up Instructions:  I discussed the assessment and treatment plan with the patient. The patient was provided an opportunity to ask questions and all were answered. The patient agreed with the plan and demonstrated an understanding of the instructions.       The patient was advised to call back or seek an in-person evaluation if the symptoms worsen or if the condition fails to improve as anticipated.    Jerry Maw, MD

## 2021-03-26 ENCOUNTER — Other Ambulatory Visit: Payer: Self-pay

## 2021-03-26 ENCOUNTER — Encounter: Payer: Self-pay | Admitting: Internal Medicine

## 2021-03-26 ENCOUNTER — Ambulatory Visit (INDEPENDENT_AMBULATORY_CARE_PROVIDER_SITE_OTHER): Payer: Commercial Managed Care - PPO | Admitting: Internal Medicine

## 2021-03-26 VITALS — BP 144/78 | HR 74 | Temp 98.0°F | Resp 18 | Ht 77.0 in | Wt 237.0 lb

## 2021-03-26 DIAGNOSIS — J40 Bronchitis, not specified as acute or chronic: Secondary | ICD-10-CM

## 2021-03-26 DIAGNOSIS — G4483 Primary cough headache: Secondary | ICD-10-CM | POA: Diagnosis not present

## 2021-03-26 DIAGNOSIS — J029 Acute pharyngitis, unspecified: Secondary | ICD-10-CM | POA: Diagnosis not present

## 2021-03-26 LAB — POC INFLUENZA A&B (BINAX/QUICKVUE)
Influenza A, POC: NEGATIVE
Influenza B, POC: NEGATIVE

## 2021-03-26 LAB — POC COVID19 BINAXNOW: SARS Coronavirus 2 Ag: NEGATIVE

## 2021-03-26 MED ORDER — AZITHROMYCIN 250 MG PO TABS: ORAL_TABLET | ORAL | 1 refills | Status: DC

## 2021-03-26 MED ORDER — PROMETHAZINE-DM 6.25-15 MG/5ML PO SYRP: ORAL_SOLUTION | ORAL | 1 refills | Status: DC

## 2021-03-26 MED ORDER — DEXAMETHASONE 4 MG PO TABS: ORAL_TABLET | ORAL | 0 refills | Status: DC

## 2021-03-28 ENCOUNTER — Encounter: Payer: Self-pay | Admitting: Internal Medicine

## 2021-03-31 NOTE — Progress Notes (Signed)
COMPLETE PHYSICAL   Assessment and Plan:  Caedan was seen today for complete physical  Diagnoses and all orders for this visit:  Encounter for general adult examination with abnormal findings Yearly   Essential hypertension       Lifestyle controled Monitor blood pressure at home; call if consistently over 130/80 Continue DASH diet.   Reminder to go to the ER if any CP, SOB, nausea, dizziness, severe HA, changes vision/speech, left arm numbness and tingling and jaw pain. -     COMPLETE METABOLIC PANEL WITH GFR -     CBC with Differential/Platelet  History of hepatitis C Treated 2016 no longer follows with GI   Hyperlipidemia, unspecified hyperlipidemia type Continue medications:Lofibra Discussed dietary and exercise modifications Low fat diet -     Lipid panel  Gastroesophageal reflux disease with esophagitis Doing well at this time Continue Pantropazole 40mg  daily and Pepcid 40 mg in the evening Diet discussed Monitor for triggers Avoid food with high acid content Avoid excessive cafeine Increase water intake'   Type 2 diabetes mellitus with hyperglycemia, without long-term current use of insulin (HCC) Continue medications: MetforminXL 500mg  two tablets BID with meals. Taking glipizide 5mg  half tablet is glucsose over 200 Start Trulicity 0.75mg  SQ QW- attempting to get patient assistance Discussed general issues about diabetes pathophysiology and management. Education: Reviewed ABCs of diabetes management (respective goals in parentheses):  A1C (<7), blood pressure (<130/80), and cholesterol (LDL <70) Dietary recommendations Encouraged aerobic exercise.  Discussed foot care, check daily Yearly retinal exam Dental exam every 6 months Monitor blood glucose, discussed goal for patient -     Hemoglobin A1c  Vitamin D deficiency Continue supplementation to maintain goal of 70-100 Taking Vitamin D 50,000 IU daily three days a week, completed and did not start  OTC -     VITAMIN D 25 Hydroxy (Vit-D Deficiency, Fractures)  Sarcoidosis Advised to stop smoking CT 07/04/20- stable Refer to Pulmonology for evaluation  Thrombocytopenia (HCC) Will continue to monitor -     CBC with Diff  Recurrent major depressive disorder, in partial remission (HCC) Restart Wellbutrin XL150mg  - continue medications, stress management techniques discussed, increase water, good sleep hygiene discussed, increase exercise, and increase veggies.   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 Discussed med's effects and SE's.   Over 40 minutes of face to face interview, exam, counseling, chart review, and critical decision making was performed.     Future Appointments  Date Time Provider Department Center  04/02/2022 10:00 AM 09/03/20, NP [facility] None    HPI Patient presents for 3 month follow up for HTN, HLD, DMIII, obesity and vitamin D deficiency.   He was having some knee pains and rheumatologist recommended Voltaren gel. He has DDD of lumbar region and pain is managed  with use of Meloxicam.  He smokes and has sarcoidosis, followed with  Dr. Shelle Iron and has seen Dr. [place] in  the past but it has been years, had visit with Dr. Everardo All in 02/2018.  Shortness of breath continues, states it is manageable. Has not seen a pulmonologist in a long time.  He continues to smoke, smokes a half a pack a day x 20 years, he states he can not handle the patches. He is on wellbutrin but states it is not better. he tried chantix but states he could not continue due to side effects.  He is not ready to quit at this time   His blood pressure has been controlled at home, today their BP is BP: 138/64 BP Readings from Last 3 Encounters:  04/02/21 138/64  03/26/21 (!) 144/78  12/17/20 120/74    He does not workout. He denies chest pain, shortness of breath, dizziness.  He is on cholesterol medication, fenofibrate and denies myalgias. His cholesterol is not at goal. The cholesterol last visit was:   Lab Results  Component Value Date   CHOL 160 06/16/2020   HDL 31 (L) 06/16/2020   LDLCALC 91 06/16/2020   TRIG 286 (H) 06/16/2020   CHOLHDL 5.2 (H) 06/16/2020   He reports that his fasting glucose ranges from 120-150  He take glipazide if it is 200 or higher.    He has been working on diet and exercise for prediabetes, he is on metformin, and denies paresthesia of the feet, polydipsia and polyuria. Last A1C in the office was:  Lab Results  Component Value Date   HGBA1C 5.9 (H) 06/16/2020   Patient is on Vitamin D supplement.   Lab Results  Component Value Date   VD25OH 50 06/16/2020     Last PSA is  Lab Results  Component Value Date   PSA 1.54 02/25/2020   PSA 1.4 02/20/2019   PSA 1.2 02/09/2018   He has a history of hepatitis C, followed with Dr. Lucas Mallow, treated on 04/22/2014, Harvoni.  Has had Hep B vaccine, no longer needs to follow up, will follow up here.    BMI is Body mass index is 32.47 kg/m., he is not working on diet and exercise. Wt Readings from Last 3 Encounters:  04/02/21 239 lb 6.4 oz (108.6 kg)  03/26/21 237 lb (107.5 kg)  12/17/20 226 lb (102.5 kg)      Current Medications:  Current Outpatient Medications on File Prior to Visit  Medication Sig Dispense Refill   azithromycin (ZITHROMAX) 250 MG tablet Take 2 tablets with Food on  Day 1, then 1 tablet Daily with Food for Sinusitis / Bronchitis 6 each 1   buPROPion (WELLBUTRIN XL) 150 MG 24 hr tablet TAKE 1 TABLET EVERY MORNING FOR MOOD, FOCUS & CONCENTRATION 90 tablet 3   Cholecalciferol 1.25 MG (50000 UT) capsule Take one tablet by mouth three days a week for twelve weeks. 36 capsule 0   famotidine (PEPCID) 40 MG tablet TAKE 1 TABLET BY MOUTH EVERY DAY IN THE EVENING 90 tablet 1   metFORMIN (GLUCOPHAGE-XR) 500 MG 24 hr tablet TAKE 2 TABLETS 2 X /DAY WITH MEALS FOR DIABETES 360 tablet 3   promethazine-dextromethorphan (PROMETHAZINE-DM) 6.25-15 MG/5ML syrup Take 1 tsp every 4 hours if needed for cough 240 mL 1   glucose blood test strip Check blood sugar 1 time daily-DX-E11.9 100 each 12   Lancets (ONETOUCH DELICA PLUS LANCET33G) MISC Check blood sugar 1 time daily-E11.9 100 each 3  pantoprazole (PROTONIX) 40 MG tablet Take 1 tablet (40 mg total) by mouth daily as needed. 90 tablet 0   No current facility-administered medications on file prior to visit.   Health Maintenance:  Immunization History  Administered Date(s) Administered   Hepatitis B 06/24/2014   Hepatitis B, adult 06/24/2014   Influenza Inj Mdck Quad Pf 01/03/2021   Influenza Inj Mdck Quad With Preservative 02/08/2017, 02/09/2018   Influenza Whole 01/17/2012   Influenza, Seasonal, Injecte, Preservative Fre 02/09/2016   Influenza,inj,Quad PF,6+ Mos 11/05/2018   Influenza-Unspecified 11/05/2018, 12/23/2019   PFIZER(Purple Top)SARS-COV-2 Vaccination 06/10/2019, 06/30/2019, 03/24/2020   Pneumococcal Conjugate-13 12/28/2011   Pneumococcal Polysaccharide-23 10/03/2012   Tdap 10/04/2011    Tetanus: 09/2011 Pneumovax: 2014 Prevnar 13: 2013 Flu vaccine: 01/03/21 Zostavax/Shingrix Discussed with patient  COVID19 SARS 2:  1/2 on 06/10/19 Hep B: 2016 Colonoscopy: 12/2013 Dr. Christella HartiganJacobs Q10 years due 2025 Heart cath 04/2013 Hep C: 09/2013 Dr Christella HartiganJacobs  Allergies:  Allergies  Allergen Reactions   Chantix [Varenicline]     Nausea   Lopid [Gemfibrozil]     Nausea/weakness   Medical History:  Past Medical History:  Diagnosis Date   Colon polyps    Diabetes mellitus without complication (HCC)    Hepatitis C    treated for hepatatis but not sure what type   Hiatal hernia    Marijuana use    Osteoarthritis    Sarcoid    Thrombocytopenia (HCC)    Tobacco abuse    SURGICAL HISTORY He  has a past surgical history that includes Appendectomy; HEAD TRAUMA (1975); and left heart catheterization with coronary angiogram (N/A, 05/17/2012). FAMILY HISTORY His family history includes Cancer in his father; Diabetes Mellitus II in his mother; Leukemia in his mother; Other in an other family member; Sarcoidosis in his paternal aunt and sister. SOCIAL HISTORY He  reports that he has been smoking cigarettes. He has a 15.00 pack-year smoking history. He has never used smokeless tobacco. He reports current alcohol use of about 6.0 standard drinks per week. He reports current drug use. Frequency: 2.00 times per week. Drug: Marijuana.  Review of Systems  Constitutional: Negative.  Negative for chills, diaphoresis, fever, malaise/fatigue and weight loss.  HENT:  Negative for congestion, ear discharge, ear pain, hearing loss, nosebleeds, sore throat and tinnitus.   Eyes: Negative.  Negative for blurred vision and double vision.  Respiratory:  Positive for shortness of breath and wheezing. Negative for cough and stridor.   Cardiovascular: Negative.  Negative for chest pain and leg swelling.  Gastrointestinal:  Positive for heartburn (controlled). Negative for abdominal pain, constipation, diarrhea, nausea and vomiting.  Genitourinary: Negative.  Negative for dysuria.  Musculoskeletal:  Positive for back pain. Negative for falls,  joint pain, myalgias and neck pain.  Skin: Negative.  Negative for rash.  Neurological: Negative.  Negative for dizziness, weakness and headaches.  Endo/Heme/Allergies:  Does not bruise/bleed easily.  Psychiatric/Behavioral: Negative.  Negative for depression. The patient does not have insomnia.     Physical Exam: Estimated body mass index is 32.47 kg/m as calculated from the following:   Height as of this encounter: 6' (1.829 m).   Weight as of this encounter: 239 lb 6.4 oz (108.6 kg). BP 138/64    Pulse 74    Temp (!) 97.3 F (36.3 C)    Ht 6' (1.829 m)    Wt 239 lb 6.4 oz (108.6 kg)    SpO2 97%    BMI 32.47 kg/m  General Appearance: Obese, in no  apparent distress.  Eyes: PERRLA, EOMs, conjunctiva no swelling or erythema, normal fundi and vessels.   Neck: Supple, thyroid normal. No bruits  Respiratory: Respiratory effort normal, Expiratory wheezes throughout the lungs Cardio: RRR without murmurs, rubs or gallops. Brisk peripheral pulses without edema.  Chest: symmetric, with normal excursions and percussion.  Abdomen: Soft, nontender, obese no guarding, rebound, hernias, masses, or organomegaly. .  Lymphatics: Non tender without lymphadenopathy.  Genitourinary: defer Musculoskeletal: Full ROM all peripheral extremities,5/5 strength, and antalgic gait.  Neurological Exam: normal  Vascular Exam: normal. Skin: Warm, dry without rashes, lesions, ecchymosis. Neuro: Cranial nerves intact, reflexes equal bilaterally. Normal muscle tone, no cerebellar symptoms. Psych: Awake and oriented X 3, normal affect, Insight and Judgment appropriate.  EKG: NSR, no ST changes  Allayna Erlich Gigi GinW Beth Spackman, NP 5:09 PM [facility]

## 2021-04-02 ENCOUNTER — Ambulatory Visit (INDEPENDENT_AMBULATORY_CARE_PROVIDER_SITE_OTHER): Payer: Commercial Managed Care - PPO | Admitting: Nurse Practitioner

## 2021-04-02 ENCOUNTER — Encounter: Payer: Self-pay | Admitting: Nurse Practitioner

## 2021-04-02 ENCOUNTER — Other Ambulatory Visit: Payer: Self-pay

## 2021-04-02 VITALS — BP 138/64 | HR 74 | Temp 97.3°F | Ht 72.0 in | Wt 239.4 lb

## 2021-04-02 DIAGNOSIS — Z1389 Encounter for screening for other disorder: Secondary | ICD-10-CM

## 2021-04-02 DIAGNOSIS — Z1329 Encounter for screening for other suspected endocrine disorder: Secondary | ICD-10-CM

## 2021-04-02 DIAGNOSIS — E1165 Type 2 diabetes mellitus with hyperglycemia: Secondary | ICD-10-CM

## 2021-04-02 DIAGNOSIS — I10 Essential (primary) hypertension: Secondary | ICD-10-CM

## 2021-04-02 DIAGNOSIS — E1169 Type 2 diabetes mellitus with other specified complication: Secondary | ICD-10-CM

## 2021-04-02 DIAGNOSIS — Z79899 Other long term (current) drug therapy: Secondary | ICD-10-CM

## 2021-04-02 DIAGNOSIS — E559 Vitamin D deficiency, unspecified: Secondary | ICD-10-CM

## 2021-04-02 DIAGNOSIS — Z720 Tobacco use: Secondary | ICD-10-CM

## 2021-04-02 DIAGNOSIS — Z125 Encounter for screening for malignant neoplasm of prostate: Secondary | ICD-10-CM

## 2021-04-02 DIAGNOSIS — M2550 Pain in unspecified joint: Secondary | ICD-10-CM

## 2021-04-02 DIAGNOSIS — D869 Sarcoidosis, unspecified: Secondary | ICD-10-CM

## 2021-04-02 DIAGNOSIS — J439 Emphysema, unspecified: Secondary | ICD-10-CM

## 2021-04-02 DIAGNOSIS — F3341 Major depressive disorder, recurrent, in partial remission: Secondary | ICD-10-CM

## 2021-04-02 DIAGNOSIS — M5136 Other intervertebral disc degeneration, lumbar region: Secondary | ICD-10-CM

## 2021-04-02 DIAGNOSIS — Z0000 Encounter for general adult medical examination without abnormal findings: Secondary | ICD-10-CM

## 2021-04-02 DIAGNOSIS — Z0001 Encounter for general adult medical examination with abnormal findings: Secondary | ICD-10-CM

## 2021-04-02 DIAGNOSIS — E785 Hyperlipidemia, unspecified: Secondary | ICD-10-CM

## 2021-04-02 DIAGNOSIS — Z136 Encounter for screening for cardiovascular disorders: Secondary | ICD-10-CM

## 2021-04-02 DIAGNOSIS — D696 Thrombocytopenia, unspecified: Secondary | ICD-10-CM

## 2021-04-02 DIAGNOSIS — R739 Hyperglycemia, unspecified: Secondary | ICD-10-CM

## 2021-04-02 DIAGNOSIS — K2100 Gastro-esophageal reflux disease with esophagitis, without bleeding: Secondary | ICD-10-CM

## 2021-04-02 MED ORDER — ALBUTEROL SULFATE HFA 108 (90 BASE) MCG/ACT IN AERS: 2.0000 | INHALATION_SPRAY | RESPIRATORY_TRACT | 0 refills | Status: DC | PRN

## 2021-04-02 MED ORDER — GLIPIZIDE 5 MG PO TABS: ORAL_TABLET | ORAL | 1 refills | Status: AC

## 2021-04-02 MED ORDER — MELOXICAM 15 MG PO TABS: ORAL_TABLET | ORAL | 2 refills | Status: DC

## 2021-04-02 MED ORDER — FENOFIBRATE MICRONIZED 200 MG PO CAPS: ORAL_CAPSULE | ORAL | 1 refills | Status: DC

## 2021-04-02 MED ORDER — TRULICITY 0.75 MG/0.5ML ~~LOC~~ SOAJ: 0.7500 mg | SUBCUTANEOUS | 3 refills | Status: DC

## 2021-04-02 NOTE — Patient Instructions (Signed)
Dulaglutide Injection What is this medication? DULAGLUTIDE (DOO la GLOO tide) treats type 2 diabetes. It works by increasing insulin levels in your body, which decreases your blood sugar (glucose). It also reduces the amount of sugar released into your blood and slows down your digestion. It can also be used to lower the risk of heart attack and stroke in people with type 2 diabetes. Changes to diet and exercise are often combined with this medication. This medicine may be used for other purposes; ask your health care provider or pharmacist if you have questions. COMMON BRAND NAME(S): Trulicity What should I tell my care team before I take this medication? They need to know if you have any of these conditions: Endocrine tumors (MEN 2) or if someone in your family had these tumors Eye disease, vision problems History of pancreatitis Kidney disease Liver disease Stomach or intestine problems Thyroid cancer or if someone in your family had thyroid cancer An unusual or allergic reaction to dulaglutide, other medications, foods, dyes, or preservatives Pregnant or trying to get pregnant Breast-feeding How should I use this medication? This medication is injected under the skin. You will be taught how to prepare and give it. Take it as directed on the prescription label on the same day of each week. Do NOT prime the pen. Keep taking it unless your care team tells you to stop. If you use this medication with insulin, you should inject this medication and the insulin separately. Do not mix them together. Do not give the injections right next to each other. Change (rotate) injection sites with each injection. This medication comes with INSTRUCTIONS FOR USE. Ask your pharmacist for directions on how to use this medication. Read the information carefully. Talk to your pharmacist or care team if you have questions. It is important that you put your used needles and syringes in a special sharps container. Do  not put them in a trash can. If you do not have a sharps container, call your pharmacist or care team to get one. A special MedGuide will be given to you by the pharmacist with each prescription and refill. Be sure to read this information carefully each time. Talk to your care team about the use of this medication in children. Special care may be needed. Overdosage: If you think you have taken too much of this medicine contact a poison control center or emergency room at once. NOTE: This medicine is only for you. Do not share this medicine with others. What if I miss a dose? If you miss a dose, take it as soon as you can unless it is more than 3 days late. If it is more than 3 days late, skip the missed dose. Take the next dose at the normal time. What may interact with this medication? Other medications for diabetes Many medications may cause changes in blood sugar, these include: Alcohol containing beverages Antiviral medications for HIV or AIDS Aspirin and aspirin-like medications Certain medications for blood pressure, heart disease, irregular heart beat Chromium Diuretics Male hormones, such as estrogens or progestins, birth control pills Fenofibrate Gemfibrozil Isoniazid Lanreotide Male hormones or anabolic steroids MAOIs like Carbex, Eldepryl, Marplan, Nardil, and Parnate Medications for allergies, asthma, cold, or cough Medications for depression, anxiety, or psychotic disturbances Medications for weight loss Niacin Nicotine NSAIDs, medications for pain and inflammation, like ibuprofen or naproxen Octreotide Pasireotide Pentamidine Phenytoin Probenecid Quinolone antibiotics such as ciprofloxacin, levofloxacin, ofloxacin Some herbal dietary supplements Steroid medications such as prednisone or cortisone Sulfamethoxazole;   trimethoprim Thyroid hormones Some medications can hide the warning symptoms of low blood sugar (hypoglycemia). You may need to monitor your blood  sugar more closely if you are taking one of these medications. These include: Beta-blockers, often used for high blood pressure or heart problems (examples include atenolol, metoprolol, propranolol) Clonidine Guanethidine Reserpine This list may not describe all possible interactions. Give your health care provider a list of all the medicines, herbs, non-prescription drugs, or dietary supplements you use. Also tell them if you smoke, drink alcohol, or use illegal drugs. Some items may interact with your medicine. What should I watch for while using this medication? Visit your care team for regular checks on your progress. Check with your care team if you have severe diarrhea, nausea, and vomiting, or if you sweat a lot. The loss of too much body fluid may make it dangerous for you to take this medication. A test called the HbA1C (A1C) will be monitored. This is a simple blood test. It measures your blood sugar control over the last 2 to 3 months. You will receive this test every 3 to 6 months. Learn how to check your blood sugar. Learn the symptoms of low and high blood sugar and how to manage them. Always carry a quick-source of sugar with you in case you have symptoms of low blood sugar. Examples include hard sugar candy or glucose tablets. Make sure others know that you can choke if you eat or drink when you develop serious symptoms of low blood sugar, such as seizures or unconsciousness. Get medical help at once. Tell your care team if you have high blood sugar. You might need to change the dose of your medication. If you are sick or exercising more than usual, you may need to change the dose of your medication. Do not skip meals. Ask your care team if you should avoid alcohol. Many nonprescription cough and cold products contain sugar or alcohol. These can affect blood sugar. Pens should never be shared. Even if the needle is changed, sharing may result in passing of viruses like hepatitis or  HIV. Wear a medical ID bracelet or chain. Carry a card that describes your condition. List the medications and doses you take on the card. What side effects may I notice from receiving this medication? Side effects that you should report to your care team as soon as possible: Allergic reactions--skin rash, itching, hives, swelling of the face, lips, tongue, or throat Change in vision Dehydration--increased thirst, dry mouth, feeling faint or lightheaded, headache, dark yellow or brown urine Kidney injury--decrease in the amount of urine, swelling of the ankles, hands, or feet Pancreatitis--severe stomach pain that spreads to your back or gets worse after eating or when touched, fever, nausea, vomiting Thyroid cancer--new mass or lump in the neck, pain or trouble swallowing, trouble breathing, hoarseness Side effects that usually do not require medical attention (report to your care team if they continue or are bothersome): Diarrhea Loss of appetite Nausea Stomach pain Vomiting This list may not describe all possible side effects. Call your doctor for medical advice about side effects. You may report side effects to FDA at 1-800-FDA-1088. Where should I keep my medication? Keep out of the reach of children and pets. Refrigeration (preferred): Store unopened pens in a refrigerator between 2 and 8 degrees C (36 and 46 degrees F). Keep it in the original carton until you are ready to take it. Do not freeze or use if the medication has been frozen.   Protect from light. Get rid of any unused medication after the expiration date on the label. Room Temperature: The pen may be stored at room temperature below 30 degrees C (86 degrees F) for up to a total of 14 days if needed. Protect from light. Avoid exposure to extreme heat. If it is stored at room temperature, throw away any unused medication after 14 days or after it expires, whichever is first. To get rid of medications that are no longer needed or  have expired: Take the medication to a medication take-back program. Check with your pharmacy or law enforcement to find a location. If you cannot return the medication, ask your pharmacist or care team how to get rid of this medication safely. NOTE: This sheet is a summary. It may not cover all possible information. If you have questions about this medicine, talk to your doctor, pharmacist, or health care provider.  2022 [employer]vier/Gold Standard (2020-06-19 00:00:00)  

## 2021-04-03 ENCOUNTER — Other Ambulatory Visit: Payer: Self-pay | Admitting: Nurse Practitioner

## 2021-04-03 DIAGNOSIS — E1165 Type 2 diabetes mellitus with hyperglycemia: Secondary | ICD-10-CM

## 2021-04-03 DIAGNOSIS — E559 Vitamin D deficiency, unspecified: Secondary | ICD-10-CM

## 2021-04-03 LAB — CBC WITH DIFFERENTIAL/PLATELET
Absolute Monocytes: 809 cells/uL (ref 200–950)
Basophils Absolute: 36 cells/uL (ref 0–200)
Basophils Relative: 0.3 %
Eosinophils Absolute: 48 cells/uL (ref 15–500)
Eosinophils Relative: 0.4 %
HCT: 44 % (ref 38.5–50.0)
Hemoglobin: 14.5 g/dL (ref 13.2–17.1)
Lymphs Abs: 2273 cells/uL (ref 850–3900)
MCH: 28.9 pg (ref 27.0–33.0)
MCHC: 33 g/dL (ref 32.0–36.0)
MCV: 87.6 fL (ref 80.0–100.0)
MPV: 9.9 fL (ref 7.5–12.5)
Monocytes Relative: 6.8 %
Neutro Abs: 8735 cells/uL — ABNORMAL HIGH (ref 1500–7800)
Neutrophils Relative %: 73.4 %
Platelets: 204 10*3/uL (ref 140–400)
RBC: 5.02 10*6/uL (ref 4.20–5.80)
RDW: 12.4 % (ref 11.0–15.0)
Total Lymphocyte: 19.1 %
WBC: 11.9 10*3/uL — ABNORMAL HIGH (ref 3.8–10.8)

## 2021-04-03 LAB — MICROALBUMIN / CREATININE URINE RATIO
Creatinine, Urine: 89 mg/dL (ref 20–320)
Microalb Creat Ratio: 63 mcg/mg creat — ABNORMAL HIGH (ref <30)
Microalb, Ur: 5.6 mg/dL

## 2021-04-03 LAB — HEMOGLOBIN A1C
Hgb A1c MFr Bld: 6.9 % of total Hgb — ABNORMAL HIGH (ref <5.7)
Mean Plasma Glucose: 151 mg/dL
eAG (mmol/L): 8.4 mmol/L

## 2021-04-03 LAB — PSA: PSA: 1.55 ng/mL (ref <=4.00)

## 2021-04-03 LAB — COMPLETE METABOLIC PANEL WITH GFR
AG Ratio: 1.7 (calc) (ref 1.0–2.5)
ALT: 21 U/L (ref 9–46)
AST: 13 U/L (ref 10–35)
Albumin: 3.9 g/dL (ref 3.6–5.1)
Alkaline phosphatase (APISO): 56 U/L (ref 35–144)
BUN: 17 mg/dL (ref 7–25)
CO2: 29 mmol/L (ref 20–32)
Calcium: 9.2 mg/dL (ref 8.6–10.3)
Chloride: 102 mmol/L (ref 98–110)
Creat: 0.93 mg/dL (ref 0.70–1.35)
Globulin: 2.3 g/dL (calc) (ref 1.9–3.7)
Glucose, Bld: 221 mg/dL — ABNORMAL HIGH (ref 65–99)
Potassium: 4 mmol/L (ref 3.5–5.3)
Sodium: 138 mmol/L (ref 135–146)
Total Bilirubin: 0.5 mg/dL (ref 0.2–1.2)
Total Protein: 6.2 g/dL (ref 6.1–8.1)
eGFR: 92 mL/min/{1.73_m2} (ref >=60)

## 2021-04-03 LAB — MICROSCOPIC MESSAGE

## 2021-04-03 LAB — URINALYSIS, ROUTINE W REFLEX MICROSCOPIC
Bacteria, UA: NONE SEEN /HPF
Bilirubin Urine: NEGATIVE
Hgb urine dipstick: NEGATIVE
Hyaline Cast: NONE SEEN /LPF
Ketones, ur: NEGATIVE
Leukocytes,Ua: NEGATIVE
Nitrite: NEGATIVE
RBC / HPF: NONE SEEN /HPF (ref 0–2)
Specific Gravity, Urine: 1.021 (ref 1.001–1.035)
Squamous Epithelial / HPF: NONE SEEN /HPF (ref <=5)
WBC, UA: NONE SEEN /HPF (ref 0–5)
pH: 7 (ref 5.0–8.0)

## 2021-04-03 LAB — LIPID PANEL
Cholesterol: 180 mg/dL (ref <200)
HDL: 36 mg/dL — ABNORMAL LOW (ref >=40)
LDL Cholesterol (Calc): 115 mg/dL (calc) — ABNORMAL HIGH
Non-HDL Cholesterol (Calc): 144 mg/dL (calc) — ABNORMAL HIGH (ref <130)
Total CHOL/HDL Ratio: 5 (calc) — ABNORMAL HIGH (ref <5.0)
Triglycerides: 173 mg/dL — ABNORMAL HIGH (ref <150)

## 2021-04-03 LAB — VITAMIN D 25 HYDROXY (VIT D DEFICIENCY, FRACTURES): Vit D, 25-Hydroxy: 23 ng/mL — ABNORMAL LOW (ref 30–100)

## 2021-04-03 LAB — TSH: TSH: 0.48 mIU/L (ref 0.40–4.50)

## 2021-04-03 LAB — MAGNESIUM: Magnesium: 1.9 mg/dL (ref 1.5–2.5)

## 2021-04-03 MED ORDER — CHOLECALCIFEROL 1.25 MG (50000 UT) PO CAPS: ORAL_CAPSULE | ORAL | 0 refills | Status: DC

## 2021-04-03 MED ORDER — TRULICITY 1.5 MG/0.5ML ~~LOC~~ SOAJ: 1.5000 mg | SUBCUTANEOUS | 3 refills | Status: DC

## 2021-04-07 ENCOUNTER — Ambulatory Visit
Admission: RE | Admit: 2021-04-07 | Discharge: 2021-04-07 | Disposition: A | Discharge status: Home / Self Care | Payer: Commercial Managed Care - PPO | Source: Ambulatory Visit | Attending: Nurse Practitioner | Admitting: Nurse Practitioner

## 2021-04-07 ENCOUNTER — Other Ambulatory Visit: Payer: Self-pay | Admitting: Nurse Practitioner

## 2021-04-07 DIAGNOSIS — J029 Acute pharyngitis, unspecified: Secondary | ICD-10-CM

## 2021-04-07 DIAGNOSIS — J40 Bronchitis, not specified as acute or chronic: Secondary | ICD-10-CM

## 2021-04-07 MED ORDER — LEVOFLOXACIN 500 MG PO TABS: 500.0000 mg | ORAL_TABLET | Freq: Every day | ORAL | 0 refills | Status: AC

## 2021-04-07 NOTE — Progress Notes (Signed)
Please advise CXR normal.  Start Levaquin and Ozempic.  If cough does not improve call the office

## 2021-04-16 ENCOUNTER — Telehealth: Payer: Self-pay | Admitting: Nurse Practitioner

## 2021-04-16 NOTE — Telephone Encounter (Signed)
asked for update on prior auth for Ozempic. Currently has sample 2.5 given 04/07/21.

## 2021-04-22 ENCOUNTER — Institutional Professional Consult (permissible substitution): Payer: Commercial Managed Care - PPO | Admitting: Pulmonary Disease

## 2021-04-22 ENCOUNTER — Ambulatory Visit (INDEPENDENT_AMBULATORY_CARE_PROVIDER_SITE_OTHER): Payer: Commercial Managed Care - PPO | Admitting: Pulmonary Disease

## 2021-04-22 ENCOUNTER — Other Ambulatory Visit: Payer: Self-pay

## 2021-04-22 ENCOUNTER — Encounter: Payer: Self-pay | Admitting: Pulmonary Disease

## 2021-04-22 ENCOUNTER — Telehealth: Payer: Self-pay | Admitting: Pulmonary Disease

## 2021-04-22 VITALS — BP 138/76 | HR 84 | Temp 98.2°F | Ht 72.0 in | Wt 238.2 lb

## 2021-04-22 DIAGNOSIS — J432 Centrilobular emphysema: Secondary | ICD-10-CM

## 2021-04-22 DIAGNOSIS — D869 Sarcoidosis, unspecified: Secondary | ICD-10-CM

## 2021-04-22 DIAGNOSIS — Z720 Tobacco use: Secondary | ICD-10-CM

## 2021-04-22 NOTE — Assessment & Plan Note (Signed)
He seems to have stable disease based on prior evaluation. We will reassess with PFTs

## 2021-04-22 NOTE — Progress Notes (Signed)
Subjective:    Patient ID: Jerry Stewart, male    DOB: September 20, 1955, 66 y.o.   MRN: [id number]  HPI  66 year old smoker presents to reestablish care for sarcoidosis. Was seen on her family practice in the past by my partner Dr. Gwenette Greet and more recently 02/2018 by Dr. Loanne Drilling.  He was diagnosed by mediastinoscopy biopsy 1995 when he presented with hilar lymphadenopathy.  On his last office visit 02/2018 he reported visual issues and was referred to ophthalmology's, repeat PFTs were scheduled and chest x-ray.  He was continued on Anoro pending PFTs  He arrives today to reestablish care.  His sister was diagnosed with sarcoidosis and is on oxygen and he would like to make sure he is not doing any worse.  He continues to smoke about half pack per day, started at age 79 and more than 20 pack years.  Reports mild dyspnea on exertion but denies frequent chest colds.  Denies skin lesions or visual problems  Chest x-ray 04/07/2021 reviewed does not show infiltrates or effusions or lymphadenopathy   PMH -diabetes type 2, hepatitis C, OA knees ,seronegative (rheum )  Significant tests/ events reviewed  LDCT 06/2020 >> centrilobular and paraseptal emphysema, calcified lymph nodes at the thoracic inlet, mediastinum and hilar, paraesophageal.  Scattered tiny nodules along pleura most of which are calcified.  Several noncalcified parenchymal nodules largest 6 mm  PFTs 10/2011 >> no airway obstruction, TLC 77%, DLCO 82%  Past Medical History:  Diagnosis Date   Colon polyps    Diabetes mellitus without complication ([place])    Hepatitis C    treated for hepatatis but not sure what type   Hiatal hernia    Marijuana use    Osteoarthritis    Sarcoid    Thrombocytopenia ([place])    Tobacco abuse     Past Surgical History:  Procedure Laterality Date   APPENDECTOMY     HEAD TRAUMA  1975   LEFT HEART CATHETERIZATION WITH CORONARY ANGIOGRAM N/A 05/17/2012   Procedure: LEFT HEART CATHETERIZATION WITH CORONARY  ANGIOGRAM;  Surgeon: Peter M Martinique, MD;  Location: [facility] CATH LAB;  Service: Cardiovascular;  Laterality: N/A;    Allergies  Allergen Reactions   Chantix [Varenicline]     Nausea   Lopid [Gemfibrozil]     Nausea/weakness    Social History   Socioeconomic History   Marital status: Married    Spouse name: Jerry Stewart   Number of children: 2   Years of education: 11th   Highest education level: Not on file  Occupational History   Occupation: laborer    Fish farm manager: [employer]    Comment: Warehouse  Tobacco Use   Smoking status: Every Day    Packs/day: 0.50    Years: 30.00    Pack years: 15.00    Types: Cigarettes   Smokeless tobacco: Never   Tobacco comments:    " i AM WORKING ON QUITTING MYSELF  Vaping Use   Vaping Use: Never used  Substance and Sexual Activity   Alcohol use: Yes    Alcohol/week: 6.0 standard drinks    Types: 6 Cans of beer per week    Comment: several times a month   Drug use: Yes    Frequency: 2.0 times per week    Types: Marijuana    Comment: Marijuana: 2-3 joints weekly   Sexual activity: Yes  Other Topics Concern   Not on file  Social History Narrative   Patient lives at home with spouse.   Caffeine Use: 3-4  cups weekly   Social Determinants of Health   Financial Resource Strain: Not on file  Food Insecurity: Not on file  Transportation Needs: Not on file  Physical Activity: Not on file  Stress: Not on file  Social Connections: Not on file  Intimate Partner Violence: Not on file     Family History  Problem Relation Age of Onset   Leukemia Mother    Diabetes Mellitus II Mother    Cancer Father        prostate   Other Other        No family h/o CAD   Sarcoidosis Sister    Sarcoidosis Paternal Aunt    Colon cancer Neg Hx    Pancreatic cancer Neg Hx    Rectal cancer Neg Hx    Stomach cancer Neg Hx     Review of Systems Constitutional: negative for anorexia, fevers and sweats  Eyes: negative for irritation, redness and visual disturbance   Ears, nose, mouth, throat, and face: negative for earaches, epistaxis, nasal congestion and sore throat  Respiratory: negative for cough, dyspnea on exertion, sputum and wheezing  Cardiovascular: negative for chest pain, dyspnea, lower extremity edema, orthopnea, palpitations and syncope  Gastrointestinal: negative for abdominal pain, constipation, diarrhea, melena, nausea and vomiting  Genitourinary:negative for dysuria, frequency and hematuria  Hematologic/lymphatic: negative for bleeding, easy bruising and lymphadenopathy  Musculoskeletal:negative for arthralgias, muscle weakness and stiff joints  Neurological: negative for coordination problems, gait problems, headaches and weakness  Endocrine: negative for diabetic symptoms including polydipsia, polyuria and weight loss     Objective:   Physical Exam  Gen. Pleasant, obese, in no distress, normal affect ENT - no pallor,icterus, no post nasal drip, class 2-3 airway Neck: No JVD, no thyromegaly, no carotid bruits Lungs: no use of accessory muscles, no dullness to percussion, decreased without rales or rhonchi  Cardiovascular: Rhythm regular, heart sounds  normal, no murmurs or gallops, no peripheral edema Abdomen: soft and non-tender, no hepatosplenomegaly, BS normal. Musculoskeletal: No deformities, no cyanosis or clubbing Neuro:  alert, non focal, no tremors       Assessment & Plan:

## 2021-04-22 NOTE — Assessment & Plan Note (Signed)
Reviewed CT images of mild emphysema with him. We will assess PFTs, if lung function has dropped we may consider adding LABA/LAMA  Smoking cessation again emphasized is the most important intervention for him. I offered him follow-up appointment with Dr. Loanne Drilling or me in 3 months Based on his smoking history, he would qualify for annual follow-up low-dose CT screening.  This also allows [place] to follow-up on previously noted nodules

## 2021-04-22 NOTE — Patient Instructions (Signed)
°  X schedule pFTs  X LDCT chest annual follow up in April 2023  X 1800- QUIT NOW for nicotine patches /pharmacy referral

## 2021-04-22 NOTE — Assessment & Plan Note (Signed)
Smoking cessation was emphasized is the most important intervention that would add years to his life He was willing to consider a quit attempt although was not able to set a quit date today.  He will try nicotine patches and see if he can cut down further

## 2021-04-23 NOTE — Telephone Encounter (Signed)
Called and spoke with pt letting him know that Dr. Vassie Loll stated for him to call 1800 Quit Now for nicotine patches/pharmacy referral and pt verbalized understanding. Nothing further needed.

## 2021-04-28 ENCOUNTER — Other Ambulatory Visit: Payer: Self-pay | Admitting: Nurse Practitioner

## 2021-04-28 ENCOUNTER — Telehealth: Payer: Self-pay

## 2021-04-28 ENCOUNTER — Other Ambulatory Visit: Payer: Self-pay

## 2021-04-28 DIAGNOSIS — E1165 Type 2 diabetes mellitus with hyperglycemia: Secondary | ICD-10-CM

## 2021-04-28 MED ORDER — MOUNJARO 2.5 MG/0.5ML ~~LOC~~ SOAJ: 2.5000 mg | SUBCUTANEOUS | 3 refills | Status: DC

## 2021-04-28 MED ORDER — GLUCOSE BLOOD VI STRP: ORAL_STRIP | 12 refills | Status: DC

## 2021-04-28 NOTE — Telephone Encounter (Signed)
The Lilly care was denied for him for Trulicity. Do you want to try Ozempic or Mounjaro?

## 2021-04-28 NOTE — Telephone Encounter (Signed)
I ordered Jerry Stewart needs a prior [place]

## 2021-04-29 ENCOUNTER — Telehealth: Payer: Self-pay

## 2021-04-29 ENCOUNTER — Other Ambulatory Visit: Payer: Self-pay | Admitting: Nurse Practitioner

## 2021-04-29 DIAGNOSIS — E1165 Type 2 diabetes mellitus with hyperglycemia: Secondary | ICD-10-CM

## 2021-04-29 MED ORDER — MOUNJARO 5 MG/0.5ML ~~LOC~~ SOAJ: 5.0000 mg | SUBCUTANEOUS | 3 refills | Status: DC

## 2021-04-29 NOTE — Telephone Encounter (Signed)
[facility] doesn't have the 2.5mg  in stock but they have 5mg . Do you want him to start on the 2.5 or can he start on 5?

## 2021-04-29 NOTE — Telephone Encounter (Signed)
Prior Auth for [place] approved through 04/28/22.

## 2021-04-29 NOTE — Telephone Encounter (Signed)
He can start on 5 mg

## 2021-05-11 ENCOUNTER — Other Ambulatory Visit: Payer: Self-pay

## 2021-05-11 DIAGNOSIS — E1165 Type 2 diabetes mellitus with hyperglycemia: Secondary | ICD-10-CM

## 2021-05-11 MED ORDER — GLUCOSE BLOOD VI STRP: ORAL_STRIP | 12 refills | Status: DC

## 2021-05-12 ENCOUNTER — Other Ambulatory Visit: Payer: Self-pay

## 2021-05-12 DIAGNOSIS — E1165 Type 2 diabetes mellitus with hyperglycemia: Secondary | ICD-10-CM

## 2021-05-12 MED ORDER — BLOOD GLUCOSE MONITOR KIT: PACK | 0 refills | Status: AC

## 2021-07-03 ENCOUNTER — Other Ambulatory Visit: Payer: Self-pay | Admitting: Nurse Practitioner

## 2021-07-03 DIAGNOSIS — E559 Vitamin D deficiency, unspecified: Secondary | ICD-10-CM

## 2021-07-13 NOTE — Progress Notes (Deleted)
FOLLOW UP 3 MONTH   Assessment and Plan:  Armel was seen today for complete physical  Diagnoses and all orders for this visit:    Essential hypertension       Lifestyle controled Monitor blood pressure at home; call if consistently over 130/80 Continue DASH diet.   Reminder to go to the ER if any CP, SOB, nausea, dizziness, severe HA, changes vision/speech, left arm numbness and tingling and jaw pain. -     COMPLETE METABOLIC PANEL WITH GFR -     CBC with Differential/Platelet   Hyperlipidemia, unspecified hyperlipidemia type Continue medications:Lofibra 200mg , trigs elevated last OV Discussed dietary and exercise modifications Low fat diet -     Lipid panel  Gastroesophageal reflux disease with esophagitis Doing well at this time Continue Pantropazole 40mg  daily Diet discussed Monitor for triggers Avoid food with high acid content Avoid excessive cafeine Increase water intake' -     pantoprazole (PROTONIX) 40 MG tablet; Take 1 tablet (40 mg total) by mouth daily as needed.  Abnormal glucose Continue medications: MetforminXL 500mg  two tablets BID with meals. Taking glipizide 5mg  half tablet is glucose of 200 or higher. Discussed general issues about diabetes pathophysiology and management. Education: Reviewed 'ABCs' of diabetes management (respective goals in parentheses):  A1C (<7), blood pressure (<130/80), and cholesterol (LDL <70) Dietary recommendations Encouraged aerobic exercise.  Discussed foot care, check daily Yearly retinal exam Dental exam every 6 months Monitor blood glucose, discussed goal for patient -     Hemoglobin A1c  Vitamin D deficiency Continue supplementation to maintain goal of 70-100 Compelted Vitamin D 50,000 IU  Discussed starting Vitamin D3 2,000IU daily -     VITAMIN D 25 Hydroxy (Vit-D Deficiency, Fractures)  Sarcoidosis Advised to stop smoking Has not gone for chest X-ray, encouraged follow up for this  Thrombocytopenia  ([place]) Will continue to monitor -     CBC with Diff  Recurrent major depressive disorder, in partial remission (HCC) Taking Wellbutrin XL150mg  - continue medications, stress management techniques discussed, increase water, good sleep hygiene discussed, increase exercise, and increase veggies.   Obesity (BMI 30.0-34.9) BMI 31 - long discussion about weight loss, diet, and exercise -recommended diet heavy in fruits and veggies and low in animal meats, cheeses, and dairy products  Vitamin D deficiency -     Vitamin D (25 hydroxy) -     Vitamin D, Ergocalciferol, (DRISDOL) 1.25 MG (50000 UT) CAPS capsule; 1 pill 3 days a week for vitamin d deficiency Discussed that def can worsen COVID outcomes, will do vitamin D RX 3 x a week for 3 months and recheck.   Tobacco abuse/Emphysema per CT 07/04/20 Strongly encouraged to stop smoking Chest CT lung cancer screen done 07/04/20 and showed emphysema, stable sarcoid- CT due will order    Further disposition pending results if labs check today. Discussed med's effects and SE's.   Over 30 minutes of face to face interview, exam, counseling, chart review, and critical decision making was performed.     Future Appointments  Date Time Provider [place]  07/14/2021  9:30 AM Magda Bernheim, NP [facility] None  07/21/2021  9:00 AM [facility] None  07/21/2021 10:15 AM Rigoberto Noel, MD [facility] None  04/02/2022 10:00 AM Magda Bernheim, NP [facility] None    HPI Patient presents for 3 month follow up for HTN, HLD, DMIII, obesity and vitamin D deficiency.  He was having some knee pains and saw rheumatology.recommended, Voltaren gel.  He reports  that was not working very well at all.  He works in a warehouse, Armed forces training and education officer, and is on his feet for 8 hours or more at a time. He also has PRN Meloxicam, Plans to contact Rheumatology for follow up.  Discussed Orthapedics   He currently smokes, and has sarcoidosis, followed with Dr. Gwenette Greet  and has seen Dr. Martinique in the past but it has been years, had visit with Dr. Loanne Drilling in 02/2018.  Denies SOB but states that he is not moving around as much.  He continues to smoke, smokes a half a pack a day x 20+ years, he states he can not handle the patches. He is on wellbutrin but states it is not better. he tried chantix but states he could not continue due to side effects.  He is not ready to quit at this time.  He has not had Lung cancer screenig via CT low dose. Discussed with patient will order today.   His blood pressure has been controlled at home, today their BP is   He does not workout. He denies chest pain, shortness of breath, dizziness.  He is on cholesterol medication, fenofibrate and denies myalgias. His cholesterol is not at goal. The cholesterol last visit was:   Lab Results  Component Value Date   CHOL 180 04/02/2021   HDL 36 (L) 04/02/2021   LDLCALC 115 (H) 04/02/2021   TRIG 173 (H) 04/02/2021   CHOLHDL 5.0 (H) 04/02/2021   He reports that his fasting glucose ranges from 100-110's.   He take glipizide $RemoveBefore'5mg'[id number]$  unless glucose is 130 or higher. H has not taken this for months.  He also take metformin $RemoveBefore'500mg'PYpyAegmHQfeC$  two tablets twice a day.   He has been working on diet and exercise for prediabetes, he is on metformin, and denies paresthesia of the feet, polydipsia and polyuria. Last A1C in the office was:  Lab Results  Component Value Date   HGBA1C 6.9 (H) 04/02/2021   Patient is on Vitamin D supplement.    Lab Results  Component Value Date   VD25OH 23 (L) 04/02/2021     Last PSA is  Lab Results  Component Value Date   PSA 1.55 04/02/2021   PSA 1.54 02/25/2020   PSA 1.4 02/20/2019   He has a history of hepatitis C, followed with Dr. Maurilio Lovely, treated on 04/22/2014, [place].   Has had Hep B vaccine, no longer needs to follow up, will follow up here.   BMI is There is no height or weight on file to calculate BMI., he is working on diet and exercise. Wt Readings from Last 3  Encounters:  04/22/21 238 lb 3.2 oz (108 kg)  04/02/21 239 lb 6.4 oz (108.6 kg)  03/26/21 237 lb (107.5 kg)     Current Medications:  Current Outpatient Medications on File Prior to Visit  Medication Sig Dispense Refill   albuterol (VENTOLIN HFA) 108 (90 Base) MCG/ACT inhaler Inhale 2 puffs into the lungs every 2 (two) hours as needed for wheezing or shortness of breath (cough). 1 each 0   azithromycin (ZITHROMAX) 250 MG tablet Take 2 tablets with Food on  Day 1, then 1 tablet Daily with Food for Sinusitis / Bronchitis 6 each 1   blood glucose meter kit and supplies KIT Dispense based on patient and insurance preference. Use up to four times daily as directed. 1 each 0   buPROPion (WELLBUTRIN XL) 150 MG 24 hr tablet TAKE 1 TABLET EVERY MORNING FOR MOOD, FOCUS & CONCENTRATION  90 tablet 3   Cholecalciferol (VITAMIN D3) 1.25 MG (50000 UT) CAPS TAKE ONE TABLET BY MOUTH THREE DAYS A WEEK FOR TWELVE WEEKS. 36 capsule 0   famotidine (PEPCID) 40 MG tablet TAKE 1 TABLET BY MOUTH EVERY DAY IN THE EVENING 90 tablet 1   fenofibrate micronized (LOFIBRA) 200 MG capsule TAKE 1 CAPSULE DAILY ORALLY WITH FOOD 90 capsule 1   glipiZIDE (GLUCOTROL) 5 MG tablet Take 1/2 tab daily with breakfast if fasting glucose is 130+. Do not take if not eating or having low blood sugars. Take a second 1/2 tab with dinner if fasting remains persistently above 130. 90 tablet 1   glucose blood test strip Check blood sugar 1 time daily-DX-E11.9 100 each 12   Lancets (ONETOUCH DELICA PLUS [id number]) MISC Check blood sugar 1 time daily-E11.9 100 each 3   meloxicam (MOBIC) 15 MG tablet TAKE 1 DAILY FOR 2 WKS, THEN 1/2-1 TAB DAILY AS NEEDED. CAN TAKE WITH TYLENOL, NO IBUPROFEN/ALEVE. 30 tablet 2   metFORMIN (GLUCOPHAGE-XR) 500 MG 24 hr tablet TAKE 2 TABLETS 2 X /DAY WITH MEALS FOR DIABETES 360 tablet 3   pantoprazole (PROTONIX) 40 MG tablet Take 1 tablet (40 mg total) by mouth daily as needed. 90 tablet 0   tirzepatide (MOUNJARO) 5  MG/0.5ML Pen Inject 5 mg into the skin once a week. 2 mL 3   No current facility-administered medications on file prior to visit.   Health Maintenance:  Immunization History  Administered Date(s) Administered   Hepatitis B 06/24/2014   Hepatitis B, adult 06/24/2014   Influenza Inj Mdck Quad Pf 01/03/2021   Influenza Inj Mdck Quad With Preservative 02/08/2017, 02/09/2018   Influenza Whole 01/17/2012   Influenza, Seasonal, Injecte, Preservative Fre 02/09/2016   Influenza,inj,Quad PF,6+ Mos 11/05/2018   Influenza-Unspecified 11/05/2018, 12/23/2019   PFIZER(Purple Top)SARS-COV-2 Vaccination 06/10/2019, 06/30/2019, 03/24/2020   Pneumococcal Conjugate-13 12/28/2011   Pneumococcal Polysaccharide-23 10/03/2012   Tdap 10/04/2011    Tetanus: 09/2011 Pneumovax: 2014 Prevnar 13: 2013 Flu vaccine: 11/2019 Zostavax/Shingrix Discussed with patient  COVID19 SARS 2: 1/2 on 06/10/19 Hep B: 2016 Colonoscopy: 12/2013 Dr. Ardis Hughs Q10 years Heart cath 04/2013 Hep C: 09/2013 Dr Ardis Hughs  Allergies:  Allergies  Allergen Reactions   Chantix [Varenicline]     Nausea   Lopid [Gemfibrozil]     Nausea/weakness   Medical History:  Past Medical History:  Diagnosis Date   Colon polyps    Diabetes mellitus without complication ([place])    Hepatitis C    treated for hepatatis but not sure what type   Hiatal hernia    Marijuana use    Osteoarthritis    Sarcoid    Thrombocytopenia ([place])    Tobacco abuse    SURGICAL HISTORY He  has a past surgical history that includes Appendectomy; HEAD TRAUMA (1975); and left heart catheterization with coronary angiogram (N/A, 05/17/2012). FAMILY HISTORY His family history includes Cancer in his father; Diabetes Mellitus II in his mother; Leukemia in his mother; Other in an other family member; Sarcoidosis in his paternal aunt and sister. SOCIAL HISTORY He  reports that he has been smoking cigarettes. He has a 15.00 pack-year smoking history. He has never used  smokeless tobacco. He reports current alcohol use of about 6.0 standard drinks per week. He reports current drug use. Frequency: 2.00 times per week. Drug: Marijuana.  Review of Systems  Constitutional: Negative.  Negative for chills, diaphoresis, fever, malaise/fatigue and weight loss.  HENT:  Negative for congestion, ear discharge, ear pain, hearing  loss, nosebleeds, sore throat and tinnitus.   Eyes: Negative.   Respiratory:  Negative for cough, shortness of breath and stridor.   Cardiovascular: Negative.  Negative for leg swelling.  Gastrointestinal: Negative.   Genitourinary: Negative.   Musculoskeletal:  Negative for back pain, falls, joint pain, myalgias and neck pain.  Skin: Negative.   Neurological: Negative.  Negative for weakness and headaches.  Psychiatric/Behavioral: Negative.      Physical Exam: Estimated body mass index is 32.31 kg/m as calculated from the following:   Height as of 04/22/21: 6' (1.829 m).   Weight as of 04/22/21: 238 lb 3.2 oz (108 kg). There were no vitals taken for this visit.   General Appearance: Obese, in no apparent distress.  Eyes: PERRLA, EOMs, conjunctiva no swelling or erythema, normal fundi and vessels.   Neck: Supple, thyroid normal. No bruits  Respiratory: Respiratory effort normal, CTA-B,  without rales, rhonchi, or stridor.  Cardio: RRR without murmurs, rubs or gallops. Brisk peripheral pulses without edema.  Chest: symmetric, with normal excursions and percussion.  Abdomen: Soft, nontender, obese no guarding, rebound, hernias, masses, or organomegaly. .  Lymphatics: Non tender without lymphadenopathy.  Genitourinary: defer Musculoskeletal: Full ROM all peripheral extremities,5/5 strength, and antalgic gait.  Neurological Exam: normal  Vascular Exam: normal. Skin: Warm, dry without rashes, lesions, ecchymosis. Neuro: Cranial nerves intact, reflexes equal bilaterally. Normal muscle tone, no cerebellar symptoms. Psych: Awake and oriented  X 3, normal affect, Insight and Judgment appropriate.     Garnet Sierras, Laqueta Jean, DNP [facility] 07/13/2021  11:14 AM

## 2021-07-14 ENCOUNTER — Ambulatory Visit: Payer: No Typology Code available for payment source | Admitting: Nurse Practitioner

## 2021-07-21 ENCOUNTER — Ambulatory Visit: Payer: Commercial Managed Care - PPO | Admitting: Pulmonary Disease

## 2021-09-07 ENCOUNTER — Other Ambulatory Visit: Payer: Self-pay | Admitting: Adult Health

## 2021-10-01 ENCOUNTER — Ambulatory Visit (INDEPENDENT_AMBULATORY_CARE_PROVIDER_SITE_OTHER): Payer: No Typology Code available for payment source | Admitting: Adult Health

## 2021-10-01 ENCOUNTER — Other Ambulatory Visit: Payer: Self-pay | Admitting: Adult Health

## 2021-10-01 ENCOUNTER — Encounter: Payer: Self-pay | Admitting: Adult Health

## 2021-10-01 VITALS — BP 134/72 | HR 94 | Temp 97.5°F | Wt 236.0 lb

## 2021-10-01 DIAGNOSIS — J432 Centrilobular emphysema: Secondary | ICD-10-CM | POA: Diagnosis not present

## 2021-10-01 DIAGNOSIS — E119 Type 2 diabetes mellitus without complications: Secondary | ICD-10-CM

## 2021-10-01 DIAGNOSIS — E1169 Type 2 diabetes mellitus with other specified complication: Secondary | ICD-10-CM

## 2021-10-01 DIAGNOSIS — M5136 Other intervertebral disc degeneration, lumbar region: Secondary | ICD-10-CM | POA: Diagnosis not present

## 2021-10-01 DIAGNOSIS — E559 Vitamin D deficiency, unspecified: Secondary | ICD-10-CM

## 2021-10-01 DIAGNOSIS — I10 Essential (primary) hypertension: Secondary | ICD-10-CM

## 2021-10-01 DIAGNOSIS — M51369 Other intervertebral disc degeneration, lumbar region without mention of lumbar back pain or lower extremity pain: Secondary | ICD-10-CM

## 2021-10-01 DIAGNOSIS — E785 Hyperlipidemia, unspecified: Secondary | ICD-10-CM

## 2021-10-01 DIAGNOSIS — Z79899 Other long term (current) drug therapy: Secondary | ICD-10-CM

## 2021-10-01 DIAGNOSIS — R809 Proteinuria, unspecified: Secondary | ICD-10-CM

## 2021-10-01 DIAGNOSIS — F3341 Major depressive disorder, recurrent, in partial remission: Secondary | ICD-10-CM

## 2021-10-01 DIAGNOSIS — M2550 Pain in unspecified joint: Secondary | ICD-10-CM | POA: Diagnosis not present

## 2021-10-01 DIAGNOSIS — Z720 Tobacco use: Secondary | ICD-10-CM

## 2021-10-01 DIAGNOSIS — D696 Thrombocytopenia, unspecified: Secondary | ICD-10-CM

## 2021-10-01 DIAGNOSIS — E669 Obesity, unspecified: Secondary | ICD-10-CM

## 2021-10-01 DIAGNOSIS — E1165 Type 2 diabetes mellitus with hyperglycemia: Secondary | ICD-10-CM

## 2021-10-01 MED ORDER — GLUCOSE BLOOD VI STRP: ORAL_STRIP | 12 refills | Status: DC

## 2021-10-01 MED ORDER — MOUNJARO 5 MG/0.5ML ~~LOC~~ SOAJ: 5.0000 mg | SUBCUTANEOUS | 2 refills | Status: DC

## 2021-10-01 MED ORDER — MELOXICAM 15 MG PO TABS: ORAL_TABLET | ORAL | 2 refills | Status: DC

## 2021-10-01 NOTE — Patient Instructions (Addendum)
Please start on daily baby coated aspirin (81 mg) If cholesterol shows LDL above 70, recommend to stop fenofibrate and start rosuvastatin -   Continue metformin and mounjaro for now, contact office if ready for next dose up on mounjaro. Will try to get off of glipizide completely.   Please work on watching diet, regular walking/exericise      SMOKING CESSATION  [employer]  [phone] for more information or for a free program for smoking cessation help.   You can call QUIT SMART 1-800-QUIT-NOW for free nicotine patches or replacement therapy- if they are out- keep calling  [facility] Can call for smoking cessation classes, [phone]  If you have a smart phone, please look up Smoke Free app, this will help you stay on track and give you information about money you have saved, life that you have gained back and a ton of more information.     ADVANTAGES OF QUITTING SMOKING Within 20 minutes, blood pressure decreases. Your pulse is at normal level. After 8 hours, carbon monoxide levels in the blood return to normal. Your oxygen level increases. After 24 hours, the chance of having a heart attack starts to decrease. Your breath, hair, and body stop smelling like smoke. After 48 hours, damaged nerve endings begin to recover. Your sense of taste and smell improve. After 72 hours, the body is virtually free of nicotine. Your bronchial tubes relax and breathing becomes easier. After 2 to 12 weeks, lungs can hold more air. Exercise becomes easier and circulation improves. After 1 year, the risk of coronary heart disease is cut in half. After 5 years, the risk of stroke falls to the same as a nonsmoker. After 10 years, the risk of lung cancer is cut in half and the risk of other cancers decreases significantly. After 15 years, the risk of coronary heart disease drops, usually to the level of a nonsmoker. You will have extra money to spend on things other than  cigarettes.     Rosuvastatin Tablets What is this medication? ROSUVASTATIN (roe SOO va sta tin) treats high cholesterol and reduces the risk of heart attack and stroke. It works by decreasing bad cholesterol and fats (such as LDL, triglycerides), and increasing good cholesterol (HDL) in your blood. It belongs to a group of medications called statins. Changes to diet and exercise are often combined with this medication. This medicine may be used for other purposes; ask your health care provider or pharmacist if you have questions. COMMON BRAND NAME(S): Crestor What should I tell my care team before I take this medication? They need to know if you have any of these conditions: Diabetes (high blood sugar) If you often drink alcohol Kidney disease Liver disease Muscle cramps, pain Stroke Thyroid disease An unusual or allergic reaction to rosuvastatin, other medications, foods, dyes, or preservatives Pregnant or trying to get pregnant Breast-feeding How should I use this medication? Take this medication by mouth with a glass of water. Follow the directions on the prescription label. You can take it with or without food. If it upsets your stomach, take it with food. Do not cut, crush or chew this medication. Swallow the tablets whole. Take your medication at regular intervals. Do not take it more often than directed. Take antacids that have a combination of aluminum and magnesium hydroxide in them at a different time of day than this medication. Take these products 2 hours AFTER this medication. Talk to your care team about the use of  this medication in children. While this medication may be prescribed for children as young as 7 for selected conditions, precautions do apply. Overdosage: If you think you have taken too much of this medicine contact a poison control center or emergency room at once. NOTE: This medicine is only for you. Do not share this medicine with others. What if I miss a  dose? If you miss a dose, take it as soon as you can. If your next dose is to be taken in less than 12 hours, then do not take the missed dose. Take the next dose at your regular time. Do not take double or extra doses. What may interact with this medication? Do not take this medication with any of the following: Supplements like red yeast rice This medication may also interact with the following: Alcohol Antacids containing aluminum hydroxide and magnesium hydroxide Cyclosporine Other medications for high cholesterol Some medications for HIV infection Warfarin This list may not describe all possible interactions. Give your health care provider a list of all the medicines, herbs, non-prescription drugs, or dietary supplements you use. Also tell them if you smoke, drink alcohol, or use illegal drugs. Some items may interact with your medicine. What should I watch for while using this medication? Visit your health care provider for regular checks on your progress. Tell your health care provider if your symptoms do not start to get better or if they get worse. Your health care provider may tell you to stop taking this medication if you develop muscle problems. If your muscle problems do not go away after stopping this medication, contact your health care provider. Do not become pregnant while taking this medication. Women should inform their health care provider if they wish to become pregnant or think they might be pregnant. There is potential for serious harm to an unborn child. Talk to your health care provider for more information. Do not breast-feed an infant while taking this medication. This medication may increase blood sugar. Ask your health care provider if changes in diet or medications are needed if you have diabetes. If you are going to need surgery or other procedure, tell your health care provider that you are using this medication. Taking this medication is only part of a total heart  healthy program. Your health care provider may give you a special diet to follow. Avoid alcohol. Avoid smoking. Ask your health care provider how much you should exercise. What side effects may I notice from receiving this medication? Side effects that you should report to your care team as soon as possible: Allergic reactions--skin rash, itching, hives, swelling of the face, lips, tongue, or throat High blood sugar (hyperglycemia)--increased thirst or amount of urine, unusual weakness, fatigue, blurry vision Liver injury--right upper belly pain, loss of appetite, nausea, light-colored stool, dark yellow or brown urine, yellowing skin or eyes, unusual weakness, fatigue Muscle injury--unusual weakness, fatigue, muscle pain, dark yellow or brown urine, decrease in amount of urine Redness, blistering, peeling or loosening of the skin, including inside the mouth Side effects that usually do not require medical attention (report to your care team if they continue or are bothersome): Fatigue Headache Nausea Stomach pain This list may not describe all possible side effects. Call your doctor for medical advice about side effects. You may report side effects to FDA at 1-800-FDA-1088. Where should I keep my medication? Keep out of the reach of children and pets. Store between 20 and 25 degrees C (68 and 77 degrees F). Get  rid of any unused medication after the expiration date. To get rid of medications that are no longer needed or have expired: Take the medication to a medication take-back program. Check with your pharmacy or law enforcement to find a location. If you cannot return the medication, check the label or package insert to see if the medication should be thrown out in the garbage or flushed down the toilet. If you are not sure, ask your care team. If it is safe to put it in the trash, take the medication out of the container. Mix the medication with cat litter, dirt, coffee grounds, or other  unwanted substance. Seal the mixture in a bag or container. Put it in the trash. NOTE: This sheet is a summary. It may not cover all possible information. If you have questions about this medicine, talk to your doctor, pharmacist, or health care provider.  2023 Elsevier/Gold Standard (2020-04-11 00:00:00)

## 2021-10-01 NOTE — Progress Notes (Signed)
6 MONTH FOLLOW UP   Assessment and Plan:    Essential hypertension       Lifestyle controled; consider ARB Monitor blood pressure at home; call if consistently over 130/80 Continue DASH diet.   Reminder to go to the ER if any CP, SOB, nausea, dizziness, severe HA, changes vision/speech, left arm numbness and tingling and jaw pain. -     COMPLETE METABOLIC PANEL WITH GFR -     CBC with Differential/Platelet  History of hepatitis C Treated 2016 no longer follows with GI - CMP/GFR  Hyperlipidemia associated with T2DM ([place]) Discussed LDL goal <70 Pending results plan to stop lofibra and start rosuvastatin Discussed dietary and exercise modifications Low fat diet -     Lipid panel  Type 2 diabetes mellitus with hyperglycemia, without long-term current use of insulin (HCC) Continue medications: MetforminXL 500mg  two tablets BID with meals. Taking glipizide 5mg  half tablet is glucsose over 200 Mounjaro 5 mg/week with mild nausea (just restarted med); defer dose change for now Discussed general issues about diabetes pathophysiology and management. Education: Reviewed 'ABCs' of diabetes management (respective goals in parentheses):  A1C (<7), blood pressure (<130/80), and cholesterol (LDL <70) Recommended to start on daily bASA, plan to add statin Dietary recommendations Encouraged aerobic exercise.  Monitor blood glucose, discussed goal for patient -     Hemoglobin A1c  Sarcoidosis Advised to stop smoking CT 07/04/20- stable Follow up Pulmonology   Thrombocytopenia ([place]) Will continue to monitor -     CBC with Diff  Recurrent major depressive disorder, in partial remission ([place]) Off of wellbutrin and feels doing well; wants to work on lifestyle stress management techniques discussed, increase water, good sleep hygiene discussed, increase exercise, and increase veggies.   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 Elsworth Soho ordered follow up and pending -  Wheezing throughout lungs- albuterol inhaler prescribed    Further disposition pending results if labs check today. Discussed med's effects and SE's.   Over 30 minutes of face to face interview, exam, counseling, chart review, and critical decision making was performed.     Future Appointments  Date Time Provider [place]  04/02/2022 10:00 AM Alycia Rossetti, NP [facility] None    HPI Patient presents for 3 month follow up for HTN, HLD, DMIII, obesity and vitamin D deficiency.  He reports was let go from job, lost insurance, now back on wife's plan. Was off of all meds for some time, has been back on 1 month or so -   He is following with Dr. Kathlene November for sarcoidosis, knee pain, DDD of lumbar; meloxicam/voltaren didn't help, now on naproxen and sulfasalazine, he reports irregular response, has follow up next month.   He smokes (wellbutrin didn't help, chantix caused nightmares, currently trying gum); hx of 0.5 pack/day for 20 years. Also emphysema/sarcoidosis, follows with Dr. Elsworth Soho. Shortness of breath continues, states it is manageable. Currently using PRN albuterol only.   BMI is Body mass index is 32.01 kg/m., he has been working on diet  and exercise, weight trending down on mounjaro.  Wt Readings from Last 3 Encounters:  10/01/21 236 lb (107 kg)  04/22/21 238 lb 3.2 oz (108 kg)  04/02/21 239 lb 6.4 oz (108.6 kg)    His blood pressure has been controlled at home, today their BP  is BP: 134/72 He does not workout. He denies chest pain, dizziness. Exertional dyspnea chronic/stable (sarcoid).  He is on cholesterol medication, fenofibrate for many years, never statin, and denies myalgias. His cholesterol is not at goal of LDL <70.  The cholesterol last visit was:   Lab Results  Component Value Date   CHOL 180 04/02/2021   HDL 36 (L) 04/02/2021   LDLCALC 115 (H) 04/02/2021   TRIG 173 (H) 04/02/2021   CHOLHDL 5.0 (H) 04/02/2021    He has been working on diet and exercise for newly progressed T2DM, he is on metformin, mounjaro 5 mg/week, will take glipizide PRN glucose 200+, and denies paresthesia of the feet, polydipsia and polyuria.  He has glucometer, ran out of strips, needs refill. Last checks were running around 100.  Last A1C in the office was:  Lab Results  Component Value Date   HGBA1C 6.9 (H) 04/02/2021   Lab Results  Component Value Date   EGFR 92 04/02/2021   Lab Results  Component Value Date   MICRALBCREAT 63 (H) 04/02/2021   Patient is on Vitamin D supplement, taking 5000 IU daily Lab Results  Component Value Date   VD25OH 23 (L) 04/02/2021     He has a history of hepatitis C, followed with Dr. Maurilio Lovely, treated on 04/22/2014, [place]. Has had Hep B vaccine, no longer needs to follow up, we monitor here.     Current Medications:  Current Outpatient Medications on File Prior to Visit  Medication Sig Dispense Refill   albuterol (VENTOLIN HFA) 108 (90 Base) MCG/ACT inhaler Inhale 2 puffs into the lungs every 2 (two) hours as needed for wheezing or shortness of breath (cough). 1 each 0   aspirin EC 81 MG tablet Take 81 mg by mouth daily. Swallow whole.     blood glucose meter kit and supplies KIT Dispense based on patient and insurance preference. Use up to four times daily as directed. 1 each 0   famotidine (PEPCID) 40 MG tablet TAKE 1 TABLET BY MOUTH EVERY DAY IN THE EVENING 90 tablet 1   fenofibrate micronized (LOFIBRA) 200 MG capsule TAKE 1  CAPSULE DAILY ORALLY WITH FOOD 90 capsule 1   glipiZIDE (GLUCOTROL) 5 MG tablet Take 1/2 tab daily with breakfast if fasting glucose is 130+. Do not take if not eating or having low blood sugars. Take a second 1/2 tab with dinner if fasting remains persistently above 130. 90 tablet 1   Lancets (ONETOUCH DELICA PLUS [id number]) MISC Check blood sugar 1 time daily-E11.9 100 each 3   metFORMIN (GLUCOPHAGE-XR) 500 MG 24 hr tablet TAKE 2 TABLETS 2 X /DAY WITH MEALS FOR DIABETES 360 tablet 3   naproxen (NAPROSYN) 500 MG tablet Take 500 mg by mouth 2 (two) times daily with a meal. Dr. Kathlene November prescribing.     sulfaSALAzine (AZULFIDINE) 500 MG tablet Take 500 mg by mouth 2 (two) times daily.     VITAMIN D, CHOLECALCIFEROL, PO Take 1 tablet by mouth daily. 5000 IU     pantoprazole (PROTONIX) 40 MG tablet Take 1 tablet (40 mg total) by mouth daily as needed. 90 tablet 0   No current facility-administered medications on file prior to visit.  Allergies:  Allergies  Allergen Reactions   Chantix [Varenicline]     Nausea   Lopid [Gemfibrozil]     Nausea/weakness   Medical History:  Past Medical History:  Diagnosis Date   Colon polyps    Diabetes mellitus without complication ([place])    Hepatitis C    treated for hepatatis but not sure what type   Hiatal hernia    Marijuana use    Osteoarthritis    Sarcoid    Thrombocytopenia ([place])    Tobacco abuse    SURGICAL HISTORY He  has a past surgical history that includes Appendectomy; HEAD TRAUMA (1975); and left heart catheterization with coronary angiogram (N/A, 05/17/2012). FAMILY HISTORY His family history includes Cancer in his father; Diabetes Mellitus II in his mother; Leukemia in his mother; Other in an other family member; Sarcoidosis in his paternal aunt and sister. SOCIAL HISTORY He  reports that he has been smoking cigarettes. He has a 15.00 pack-year smoking history. He has never used smokeless tobacco. He reports current alcohol use of about  6.0 standard drinks of alcohol per week. He reports current drug use. Frequency: 2.00 times per week. Drug: Marijuana.  Review of Systems  Constitutional: Negative.  Negative for chills, diaphoresis, fever, malaise/fatigue and weight loss.  HENT:  Negative for congestion, ear discharge, ear pain, hearing loss, nosebleeds, sore throat and tinnitus.   Eyes: Negative.  Negative for blurred vision and double vision.  Respiratory:  Positive for shortness of breath (chronic exertional, stable). Negative for cough, wheezing and stridor.   Cardiovascular: Negative.  Negative for chest pain and leg swelling.  Gastrointestinal:  Positive for heartburn (controlled). Negative for abdominal pain, constipation, diarrhea, nausea and vomiting.  Genitourinary: Negative.  Negative for dysuria.  Musculoskeletal:  Positive for back pain and joint pain (knee pain). Negative for falls, myalgias and neck pain.  Skin: Negative.  Negative for rash.  Neurological: Negative.  Negative for dizziness, weakness and headaches.  Endo/Heme/Allergies:  Does not bruise/bleed easily.  Psychiatric/Behavioral: Negative.  Negative for depression. The patient does not have insomnia.      Physical Exam: Estimated body mass index is 32.01 kg/m as calculated from the following:   Height as of 04/22/21: 6' (1.829 m).   Weight as of this encounter: 236 lb (107 kg). BP 134/72   Pulse 94   Temp (!) 97.5 F (36.4 C)   Wt 236 lb (107 kg)   SpO2 96%   BMI 32.01 kg/m  General Appearance: Obese, in no apparent distress.  Eyes: PERRLA, EOMs, conjunctiva no swelling or erythema Neck: Supple, thyroid normal.  Respiratory: Respiratory effort normal, no wheezing, rhonchi, crackles.  Cardio: RRR without murmurs, rubs or gallops. Brisk peripheral pulses without edema.  Chest: symmetric, with normal excursions and percussion.  Abdomen: Soft, nontender, obese no guarding, rebound, hernias, masses, or organomegaly. .  Lymphatics: Non tender  without lymphadenopathy.  Musculoskeletal: Full ROM all peripheral extremities,5/5 strength, and antalgic gait.  Neurological Exam: normal  Vascular Exam: normal. Skin: Warm, dry without rashes, lesions, ecchymosis. Neuro: Cranial nerves intact, reflexes equal bilaterally. Normal muscle tone, no cerebellar symptoms. Psych: Awake and oriented X 3, normal affect, Insight and Judgment appropriate.   Izora Ribas, NP 5:09 PM [facility]

## 2021-10-02 ENCOUNTER — Other Ambulatory Visit: Payer: Self-pay | Admitting: Adult Health

## 2021-10-02 DIAGNOSIS — E1129 Type 2 diabetes mellitus with other diabetic kidney complication: Secondary | ICD-10-CM | POA: Insufficient documentation

## 2021-10-02 DIAGNOSIS — E1169 Type 2 diabetes mellitus with other specified complication: Secondary | ICD-10-CM

## 2021-10-02 DIAGNOSIS — I10 Essential (primary) hypertension: Secondary | ICD-10-CM

## 2021-10-02 LAB — LIPID PANEL
Cholesterol: 168 mg/dL (ref <200)
HDL: 28 mg/dL — ABNORMAL LOW (ref >=40)
LDL Cholesterol (Calc): 103 mg/dL (calc) — ABNORMAL HIGH
Non-HDL Cholesterol (Calc): 140 mg/dL (calc) — ABNORMAL HIGH (ref <130)
Total CHOL/HDL Ratio: 6 (calc) — ABNORMAL HIGH (ref <5.0)
Triglycerides: 245 mg/dL — ABNORMAL HIGH (ref <150)

## 2021-10-02 LAB — COMPLETE METABOLIC PANEL WITH GFR
AG Ratio: 1.8 (calc) (ref 1.0–2.5)
ALT: 12 U/L (ref 9–46)
AST: 14 U/L (ref 10–35)
Albumin: 4.6 g/dL (ref 3.6–5.1)
Alkaline phosphatase (APISO): 66 U/L (ref 35–144)
BUN: 12 mg/dL (ref 7–25)
CO2: 26 mmol/L (ref 20–32)
Calcium: 10.3 mg/dL (ref 8.6–10.3)
Chloride: 108 mmol/L (ref 98–110)
Creat: 1.15 mg/dL (ref 0.70–1.35)
Globulin: 2.6 g/dL (calc) (ref 1.9–3.7)
Glucose, Bld: 97 mg/dL (ref 65–99)
Potassium: 4 mmol/L (ref 3.5–5.3)
Sodium: 142 mmol/L (ref 135–146)
Total Bilirubin: 0.6 mg/dL (ref 0.2–1.2)
Total Protein: 7.2 g/dL (ref 6.1–8.1)
eGFR: 71 mL/min/{1.73_m2} (ref >=60)

## 2021-10-02 LAB — CBC WITH DIFFERENTIAL/PLATELET
Absolute Monocytes: 562 cells/uL (ref 200–950)
Basophils Absolute: 58 cells/uL (ref 0–200)
Basophils Relative: 0.8 %
Eosinophils Absolute: 122 cells/uL (ref 15–500)
Eosinophils Relative: 1.7 %
HCT: 49.5 % (ref 38.5–50.0)
Hemoglobin: 15.8 g/dL (ref 13.2–17.1)
Lymphs Abs: 1483 cells/uL (ref 850–3900)
MCH: 27.4 pg (ref 27.0–33.0)
MCHC: 31.9 g/dL — ABNORMAL LOW (ref 32.0–36.0)
MCV: 85.9 fL (ref 80.0–100.0)
MPV: 11 fL (ref 7.5–12.5)
Monocytes Relative: 7.8 %
Neutro Abs: 4975 cells/uL (ref 1500–7800)
Neutrophils Relative %: 69.1 %
Platelets: 133 10*3/uL — ABNORMAL LOW (ref 140–400)
RBC: 5.76 10*6/uL (ref 4.20–5.80)
RDW: 13.2 % (ref 11.0–15.0)
Total Lymphocyte: 20.6 %
WBC: 7.2 10*3/uL (ref 3.8–10.8)

## 2021-10-02 LAB — TSH: TSH: 0.95 mIU/L (ref 0.40–4.50)

## 2021-10-02 LAB — VITAMIN D 25 HYDROXY (VIT D DEFICIENCY, FRACTURES): Vit D, 25-Hydroxy: 26 ng/mL — ABNORMAL LOW (ref 30–100)

## 2021-10-02 LAB — MAGNESIUM: Magnesium: 2 mg/dL (ref 1.5–2.5)

## 2021-10-02 LAB — MICROALBUMIN / CREATININE URINE RATIO
Creatinine, Urine: 163 mg/dL (ref 20–320)
Microalb Creat Ratio: 57 mcg/mg creat — ABNORMAL HIGH (ref <30)
Microalb, Ur: 9.3 mg/dL

## 2021-10-02 LAB — HEMOGLOBIN A1C
Hgb A1c MFr Bld: 5.6 % of total Hgb (ref <5.7)
Mean Plasma Glucose: 114 mg/dL
eAG (mmol/L): 6.3 mmol/L

## 2021-10-02 MED ORDER — ROSUVASTATIN CALCIUM 20 MG PO TABS: ORAL_TABLET | ORAL | 3 refills | Status: DC

## 2021-10-02 MED ORDER — LOSARTAN POTASSIUM 25 MG PO TABS: ORAL_TABLET | ORAL | 3 refills | Status: DC

## 2021-12-29 NOTE — Progress Notes (Unsigned)
Assessment and Plan:  There are no diagnoses linked to this encounter.    Further disposition pending results of labs. Discussed med's effects and SE's.   Over 30 minutes of exam, counseling, chart review, and critical decision making was performed.   Future Appointments  Date Time Provider [place]  12/30/2021 10:30 AM Alycia Rossetti, NP [facility] None  04/02/2022 10:00 AM Alycia Rossetti, NP [facility] None    ------------------------------------------------------------------------------------------------------------------   HPI There were no vitals taken for this visit. 66 y.o.male presents for  Past Medical History:  Diagnosis Date   Colon polyps    Diabetes mellitus without complication ([place])    Hepatitis C    treated for hepatatis but not sure what type   Hiatal hernia    Marijuana use    Osteoarthritis    Sarcoid    Thrombocytopenia (HCC)    Tobacco abuse      Allergies  Allergen Reactions   Chantix [Varenicline]     Nausea   Lopid [Gemfibrozil]     Nausea/weakness    Current Outpatient Medications on File Prior to Visit  Medication Sig   albuterol (VENTOLIN HFA) 108 (90 Base) MCG/ACT inhaler Inhale 2 puffs into the lungs every 2 (two) hours as needed for wheezing or shortness of breath (cough).   aspirin EC 81 MG tablet Take 81 mg by mouth daily. Swallow whole.   blood glucose meter kit and supplies KIT Dispense based on patient and insurance preference. Use up to four times daily as directed.   famotidine (PEPCID) 40 MG tablet TAKE 1 TABLET BY MOUTH EVERY DAY IN THE EVENING   glipiZIDE (GLUCOTROL) 5 MG tablet Take 1/2 tab daily with breakfast if fasting glucose is 130+. Do not take if not eating or having low blood sugars. Take a second 1/2 tab with dinner if fasting remains persistently above 130.   glucose blood test strip Check blood sugar 1 time daily-DX-E11.9   Lancets (ONETOUCH DELICA PLUS [id number]) MISC Check blood sugar 1 time  daily-E11.9   losartan (COZAAR) 25 MG tablet Take 1 tab daily for blood pressure and diabetes kidney protection.   metFORMIN (GLUCOPHAGE-XR) 500 MG 24 hr tablet TAKE 2 TABLETS 2 X /DAY WITH MEALS FOR DIABETES   naproxen (NAPROSYN) 500 MG tablet Take 500 mg by mouth 2 (two) times daily with a meal. Dr. Kathlene November prescribing.   pantoprazole (PROTONIX) 40 MG tablet Take 1 tablet (40 mg total) by mouth daily as needed.   rosuvastatin (CRESTOR) 20 MG tablet Take 1 tab daily for cholesterol.   sulfaSALAzine (AZULFIDINE) 500 MG tablet Take 500 mg by mouth 2 (two) times daily.   tirzepatide [facility]) 5 MG/0.5ML Pen Inject 5 mg into the skin once a week.   VITAMIN D, CHOLECALCIFEROL, PO Take 10,000 Units by mouth daily.   No current facility-administered medications on file prior to visit.    ROS: all negative except above.   Physical Exam:  There were no vitals taken for this visit.  General Appearance: Well nourished, in no apparent distress. Eyes: PERRLA, EOMs, conjunctiva no swelling or erythema Sinuses: No Frontal/maxillary tenderness ENT/Mouth: Ext aud canals clear, TMs without erythema, bulging. No erythema, swelling, or exudate on post pharynx.  Tonsils not swollen or erythematous. Hearing normal.  Neck: Supple, thyroid normal.  Respiratory: Respiratory effort normal, BS equal bilaterally without rales, rhonchi, wheezing or stridor.  Cardio: RRR with no MRGs. Brisk peripheral pulses without edema.  Abdomen: Soft, + BS.  Non tender, no guarding,  rebound, hernias, masses. Lymphatics: Non tender without lymphadenopathy.  Musculoskeletal: Full ROM, 5/5 strength, normal gait.  Skin: Warm, dry without rashes, lesions, ecchymosis.  Neuro: Cranial nerves intact. Normal muscle tone, no cerebellar symptoms. Sensation intact.  Psych: Awake and oriented X 3, normal affect, Insight and Judgment appropriate.     Alycia Rossetti, NP 3:19 PM [facility]

## 2021-12-30 ENCOUNTER — Encounter: Payer: Self-pay | Admitting: Nurse Practitioner

## 2021-12-30 ENCOUNTER — Ambulatory Visit (INDEPENDENT_AMBULATORY_CARE_PROVIDER_SITE_OTHER): Payer: No Typology Code available for payment source | Admitting: Nurse Practitioner

## 2021-12-30 VITALS — BP 128/72 | HR 80 | Temp 97.7°F | Ht 72.0 in | Wt 243.2 lb

## 2021-12-30 DIAGNOSIS — R221 Localized swelling, mass and lump, neck: Secondary | ICD-10-CM

## 2021-12-30 DIAGNOSIS — J40 Bronchitis, not specified as acute or chronic: Secondary | ICD-10-CM

## 2021-12-30 DIAGNOSIS — E1165 Type 2 diabetes mellitus with hyperglycemia: Secondary | ICD-10-CM | POA: Diagnosis not present

## 2021-12-30 DIAGNOSIS — J439 Emphysema, unspecified: Secondary | ICD-10-CM | POA: Diagnosis not present

## 2021-12-30 DIAGNOSIS — D869 Sarcoidosis, unspecified: Secondary | ICD-10-CM

## 2021-12-30 DIAGNOSIS — Z79899 Other long term (current) drug therapy: Secondary | ICD-10-CM

## 2021-12-30 MED ORDER — PREDNISONE 20 MG PO TABS: ORAL_TABLET | ORAL | 0 refills | Status: AC

## 2021-12-30 MED ORDER — ALBUTEROL SULFATE HFA 108 (90 BASE) MCG/ACT IN AERS
2.0000 | INHALATION_SPRAY | RESPIRATORY_TRACT | 0 refills | Status: DC | PRN
Start: 2021-12-30 — End: 2022-01-21

## 2021-12-30 MED ORDER — AZITHROMYCIN 250 MG PO TABS: ORAL_TABLET | ORAL | 1 refills | Status: DC

## 2021-12-30 MED ORDER — MOUNJARO 5 MG/0.5ML ~~LOC~~ SOAJ: 5.0000 mg | SUBCUTANEOUS | 2 refills | Status: DC

## 2021-12-30 NOTE — Patient Instructions (Signed)
Prednisone 20 mg 3 tabs for 3 days, 2 tabs for 3 days and 1 tab for 5 days  Zithromax 2 tabs on day 1 and then 1 tab daily for 4 more days  Stop Smoking  YOU CAN CALL TO MAKE AN ULTRASOUND..  I have put in an order for an ultrasound for you to have You can set them up at your convenience by calling this number [phone] You will likely have the ultrasound at [address]  If you have any issues call our office and we will set this up for you.    Acute Bronchitis, Adult  Acute bronchitis is when air tubes in the lungs (bronchi) suddenly get swollen. The condition can make it hard for you to breathe. In adults, acute bronchitis usually goes away within 2 weeks. A cough caused by bronchitis may last up to 3 weeks. Smoking, allergies, and asthma can make the condition worse. What are the causes? Germs that cause cold and flu (viruses). The most common cause of this condition is the virus that causes the common cold. Bacteria. Substances that bother (irritate) the lungs, including: Smoke from cigarettes and other types of tobacco. Dust and pollen. Fumes from chemicals, gases, or burned fuel. Indoor or outdoor air pollution. What increases the risk? A weak body's defense system. This is also called the immune system. Any condition that affects your lungs and breathing, such as asthma. What are the signs or symptoms? A cough. Coughing up clear, yellow, or green mucus. Making high-pitched whistling sounds when you breathe, most often when you breathe out (wheezing). Runny or stuffy nose. Having too much mucus in your lungs (chest congestion). Shortness of breath. Body aches. A sore throat. How is this treated? Acute bronchitis may go away over time without treatment. Your doctor may tell you to: Drink more fluids. This will help thin your mucus so it is easier to cough up. Use a device that gets medicine into your lungs (inhaler). Use a vaporizer or a humidifier.  These are machines that add water to the air. This helps with coughing and poor breathing. Take a medicine that thins mucus and helps clear it from your lungs. Take a medicine that prevents or stops coughing. It is not common to take an antibiotic medicine for this condition. Follow these instructions at home:  Take over-the-counter and prescription medicines only as told by your doctor. Use an inhaler, vaporizer, or humidifier as told by your doctor. Take two teaspoons (10 mL) of honey at bedtime. This helps lessen your coughing at night. Drink enough fluid to keep your pee (urine) pale yellow. Do not smoke or use any products that contain nicotine or tobacco. If you need help quitting, ask your doctor. Get a lot of rest. Return to your normal activities when your doctor says that it is safe. Keep all follow-up visits. How is this prevented?  Wash your hands often with soap and water for at least 20 seconds. If you cannot use soap and water, use hand sanitizer. Avoid contact with people who have cold symptoms. Try not to touch your mouth, nose, or eyes with your hands. Avoid breathing in smoke or chemical fumes. Make sure to get the flu shot every year. Contact a doctor if: Your symptoms do not get better in 2 weeks. You have trouble coughing up the mucus. Your cough keeps you awake at night. You have a fever. Get help right away if: You cough up blood.  You have chest pain. You have very bad shortness of breath. You faint or keep feeling like you are going to faint. You have a very bad headache. Your fever or chills get worse. These symptoms may be an emergency. Get help right away. Call your local emergency services (911 in the [place] Do not wait to see if the symptoms will go away. Do not drive yourself to the hospital. Summary Acute bronchitis is when air tubes in the lungs (bronchi) suddenly get swollen. In adults, acute bronchitis usually goes away within 2 weeks. Drink  more fluids. This will help thin your mucus so it is easier to cough up. Take over-the-counter and prescription medicines only as told by your doctor. Contact a doctor if your symptoms do not improve after 2 weeks of treatment. This information is not intended to replace advice given to you by your health care provider. Make sure you discuss any questions you have with your health care provider. Document Revised: 07/16/2020 Document Reviewed: 07/16/2020 Elsevier Patient Education  [place].

## 2021-12-31 ENCOUNTER — Ambulatory Visit
Admission: RE | Admit: 2021-12-31 | Discharge: 2021-12-31 | Disposition: A | Discharge status: Home / Self Care | Payer: No Typology Code available for payment source | Source: Ambulatory Visit | Attending: Nurse Practitioner | Admitting: Nurse Practitioner

## 2021-12-31 DIAGNOSIS — R221 Localized swelling, mass and lump, neck: Secondary | ICD-10-CM

## 2021-12-31 DIAGNOSIS — Z79899 Other long term (current) drug therapy: Secondary | ICD-10-CM

## 2021-12-31 LAB — COMPLETE METABOLIC PANEL WITH GFR
AG Ratio: 2 (calc) (ref 1.0–2.5)
ALT: 19 U/L (ref 9–46)
AST: 17 U/L (ref 10–35)
Albumin: 4.5 g/dL (ref 3.6–5.1)
Alkaline phosphatase (APISO): 91 U/L (ref 35–144)
BUN: 14 mg/dL (ref 7–25)
CO2: 24 mmol/L (ref 20–32)
Calcium: 9.6 mg/dL (ref 8.6–10.3)
Chloride: 108 mmol/L (ref 98–110)
Creat: 0.82 mg/dL (ref 0.70–1.35)
Globulin: 2.2 g/dL (calc) (ref 1.9–3.7)
Glucose, Bld: 169 mg/dL — ABNORMAL HIGH (ref 65–99)
Potassium: 4.3 mmol/L (ref 3.5–5.3)
Sodium: 142 mmol/L (ref 135–146)
Total Bilirubin: 0.4 mg/dL (ref 0.2–1.2)
Total Protein: 6.7 g/dL (ref 6.1–8.1)
eGFR: 98 mL/min/{1.73_m2} (ref >=60)

## 2021-12-31 LAB — CBC WITH DIFFERENTIAL/PLATELET
Absolute Monocytes: 476 cells/uL (ref 200–950)
Basophils Absolute: 47 cells/uL (ref 0–200)
Basophils Relative: 0.7 %
Eosinophils Absolute: 181 cells/uL (ref 15–500)
Eosinophils Relative: 2.7 %
HCT: 48.2 % (ref 38.5–50.0)
Hemoglobin: 16 g/dL (ref 13.2–17.1)
Lymphs Abs: 1608 cells/uL (ref 850–3900)
MCH: 28.7 pg (ref 27.0–33.0)
MCHC: 33.2 g/dL (ref 32.0–36.0)
MCV: 86.4 fL (ref 80.0–100.0)
MPV: 10.6 fL (ref 7.5–12.5)
Monocytes Relative: 7.1 %
Neutro Abs: 4389 cells/uL (ref 1500–7800)
Neutrophils Relative %: 65.5 %
Platelets: 204 10*3/uL (ref 140–400)
RBC: 5.58 10*6/uL (ref 4.20–5.80)
RDW: 13 % (ref 11.0–15.0)
Total Lymphocyte: 24 %
WBC: 6.7 10*3/uL (ref 3.8–10.8)

## 2021-12-31 LAB — TSH: TSH: 0.97 mIU/L (ref 0.40–4.50)

## 2022-01-21 ENCOUNTER — Other Ambulatory Visit: Payer: Self-pay | Admitting: Nurse Practitioner

## 2022-01-21 DIAGNOSIS — D869 Sarcoidosis, unspecified: Secondary | ICD-10-CM

## 2022-01-21 DIAGNOSIS — J439 Emphysema, unspecified: Secondary | ICD-10-CM

## 2022-03-09 ENCOUNTER — Encounter: Payer: Self-pay | Admitting: Internal Medicine

## 2022-03-19 ENCOUNTER — Other Ambulatory Visit: Payer: Self-pay | Admitting: Nurse Practitioner

## 2022-03-19 DIAGNOSIS — E1165 Type 2 diabetes mellitus with hyperglycemia: Secondary | ICD-10-CM

## 2022-03-31 NOTE — Progress Notes (Signed)
COMPLETE PHYSICAL   Assessment and Plan:  Dhillon was seen today for complete physical  Diagnoses and all orders for this visit:  Encounter for general adult examination with abnormal findings Yearly   Essential hypertension       Lifestyle controled Monitor blood pressure at home; call if consistently over 130/80 Continue DASH diet.   Reminder to go to the ER if any CP, SOB, nausea, dizziness, severe HA, changes vision/speech, left arm numbness and tingling and jaw pain. -     COMPLETE METABOLIC PANEL WITH GFR -     CBC with Differential/Platelet  History of hepatitis C Treated 2016 no longer follows with GI   Hyperlipidemia, unspecified hyperlipidemia type Continue medications:Lofibra Discussed dietary and exercise modifications Low fat diet -     Lipid panel  Gastroesophageal reflux disease with esophagitis Doing well at this time Continue Pantropazole 44m daily and Pepcid 40 mg in the evening Diet discussed Monitor for triggers Avoid food with high acid content Avoid excessive cafeine Increase water intake'   Type 2 diabetes mellitus with hyperglycemia, without long-term current use of insulin (HCC) Continue medications: MetforminXL 5063mtwo tablets QAM with meal. Taking glipizide 55m19malf tablet is glucsose over 200 On Mounjaro 5 mg SQ QW Discussed general issues about diabetes pathophysiology and management. Education: Reviewed 'ABCs' of diabetes management (respective goals in parentheses):  A1C (<7), blood pressure (<130/80), and cholesterol (LDL <70) Dietary recommendations Encouraged aerobic exercise.  Discussed foot care, check daily Yearly retinal exam Dental exam every 6 months Monitor blood glucose, discussed goal for patient -     Hemoglobin A1c  Vitamin D deficiency Continue supplementation to maintain goal of 70-100 Taking Vitamin D 50,000 IU daily three days a week, completed and did not start OTC -     VITAMIN D 25 Hydroxy (Vit-D Deficiency,  Fractures)  Sarcoidosis Advised to stop smoking CT 07/04/20- stable Follows with Dr AlvElsworth Sohod Dr AryKathlene Novembere ordered another Chest CT lung cancer screen Started on Humira through Dr AryKathlene Novemberhrombocytopenia [facility] -     CBC with Diff  Recurrent major depressive disorder, in partial remission (HCCOntonagonestart Wellbutrin XL150m51mcontinue medications, stress management techniques discussed, increase water, good sleep hygiene discussed, increase exercise, and increase veggies.   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 AlvaElsworth SohoD lumbar Continue Meloxicam as needed Follow with orthopedics  Ankylosing spondylitis Follows with Rheumatology Dr AryaKathlene Novemberrently on Humira  Screening for AAA - U/S abd retroperitoneal LTD  Further disposition pending results if labs check today. Discussed med's effects and SE's.   Over 40 minutes of face to face interview, exam, counseling, chart review, and critical decision making was performed.     Future Appointments  Date Time Provider DepaSisseton7/2025 10:00 AM WilkAlycia Rossetti [facility] None    HPI  Patient presents for 3 month follow up for HTN, HLD, DMIII, obesity and vitamin D deficiency.  He is having some  issues with depression and has used Wellbutrin in the past but ran out and never got refilled.  He is feeling like he would like to restart the medication. He has not been doing as well since retiring. He had been isolating himself more.  No thoughts of hurting self or others.   He was having some knee pains and rheumatologist recommended Voltaren gel. He has DDD of lumbar region , also has ankylosis spondylitis and pain is managed with use of Meloxicam. He continues to follow with Dr Kathlene November and last visit was 03/26/22. He is currently on Humira. The medication is helping for sarcoidosis  He smokes and has sarcoidosis, followed with Dr. Elsworth Soho last visit 04/22/21.  Shortness of breath continues, states it is manageable. Has not seen a pulmonologist in a long time since he had lost insurance but insurance is not reinstated so is going to schedule an appointment  He continues to smoke, smokes a 3 packs a week x 20 years, he states he can not handle the patches. He tried chantix but states he could not continue due to side effects.  He is not ready to quit at this time   His blood pressure has been controlled at home, today their BP is BP: 112/68 BP Readings from Last 3 Encounters:  04/02/22 112/68  12/30/21 128/72  10/01/21 134/72    He does not workout. He denies chest pain, shortness of breath, dizziness.  He is on cholesterol medication, Rosuvastatin 20 mg , fenofibrate and denies myalgias. His cholesterol is not at goal. The cholesterol last visit was:   Lab Results  Component Value Date   CHOL 168 10/01/2021   HDL 28 (L) 10/01/2021   LDLCALC 103 (H) 10/01/2021   TRIG 245 (H) 10/01/2021   CHOLHDL 6.0 (H) 10/01/2021   He reports that his fasting glucose ranges 100's rare 140-150  He take glipizide if it is 200 or higher.  Currently on Mounjaro 5 mg, Metformin 500 mg 2 tabs PO QD  He has been working on diet and exercise for diabetes and denies paresthesia of the feet, polydipsia and polyuria.  Last A1C in the office was:  Lab Results  Component Value Date   HGBA1C 5.6 10/01/2021   Patient is on Vitamin D supplement.   Lab Results  Component Value Date   VD25OH 26 (L) 10/01/2021     Last PSA is  Lab Results  Component Value Date   PSA 1.55 04/02/2021   PSA 1.54 02/25/2020   PSA 1.4 02/20/2019   He has a history of hepatitis C, followed with Dr. Maurilio Lovely, treated on 04/22/2014, [place].  Has had Hep B vaccine, no longer needs to follow up, will follow up here.    BMI is Body mass index is 30.81 kg/m., he is not working on diet and exercise. Wt Readings from Last 3 Encounters:  04/02/22 227 lb 3.2 oz (103.1 kg)  12/30/21 243 lb 3.2 oz (110.3 kg)  10/01/21 236 lb (107 kg)     Current Medications:  Current Outpatient Medications on File Prior to Visit  Medication Sig Dispense Refill   Adalimumab (HUMIRA PEN [place]) Inject into the skin every 14 (fourteen) days.     albuterol (VENTOLIN HFA) 108 (90 Base) MCG/ACT inhaler INHALE 2 PUFFS INTO THE LUNGS EVERY 2 HOURS AS NEEDED FOR WHEEZING OR SHORTNESS OF BREATH (COUGH). 8.5  each 1   blood glucose meter kit and supplies KIT Dispense based on patient and insurance preference. Use up to four times daily as directed. 1 each 0   famotidine (PEPCID) 40 MG tablet TAKE 1 TABLET BY MOUTH EVERY DAY IN THE EVENING 90 tablet 1   glipiZIDE (GLUCOTROL) 5 MG tablet Take 1/2 tab daily with breakfast if fasting glucose is 130+. Do not take if not eating or having low blood sugars. Take a second 1/2 tab with dinner if fasting remains persistently above 130. 90 tablet 1   glucose blood test strip Check blood sugar 1 time daily-DX-E11.9 100 each 12   Lancets (ONETOUCH DELICA PLUS RCBULA45X) MISC Check blood sugar 1 time daily-E11.9 100 each 3   losartan (COZAAR) 25 MG tablet Take 1 tab daily for blood pressure and diabetes kidney protection. 90 tablet 3   metFORMIN (GLUCOPHAGE-XR) 500 MG 24 hr tablet TAKE 2 TABLETS 2 X /DAY WITH MEALS FOR DIABETES 360  tablet 3   naproxen (NAPROSYN) 500 MG tablet Take 500 mg by mouth 2 (two) times daily with a meal. Dr. Kathlene November prescribing.     rosuvastatin (CRESTOR) 20 MG tablet Take 1 tab daily for cholesterol. 90 tablet 3   tirzepatide (MOUNJARO) 5 MG/0.5ML Pen INJECT 5 MG SUBCUTANEOUSLY WEEKLY 2 mL 2   VITAMIN D, CHOLECALCIFEROL, PO Take 10,000 Units by mouth daily.     pantoprazole (PROTONIX) 40 MG tablet Take 1 tablet (40 mg total) by mouth daily as needed. 90 tablet 0   sulfaSALAzine (AZULFIDINE) 500 MG tablet Take 500 mg by mouth 2 (two) times daily.     No current facility-administered medications on file prior to visit.   Health Maintenance:  Immunization History  Administered Date(s) Administered   Hepatitis B 06/24/2014   Hepatitis B, adult 06/24/2014   Influenza Inj Mdck Quad Pf 01/03/2021   Influenza Inj Mdck Quad With Preservative 02/08/2017, 02/09/2018   Influenza Whole 01/17/2012   Influenza, Seasonal, Injecte, Preservative Fre 02/09/2016   Influenza,inj,Quad PF,6+ Mos 11/05/2018   Influenza-Unspecified 11/05/2018, 12/23/2019, 12/01/2021   PFIZER(Purple Top)SARS-COV-2 Vaccination 06/10/2019, 06/30/2019, 03/24/2020   PNEUMOCOCCAL CONJUGATE-20 12/01/2021   Pneumococcal Conjugate-13 12/28/2011   Pneumococcal Polysaccharide-23 10/03/2012   Tdap 10/04/2011    Tetanus: 09/2011 Pneumovax: 2014 Prevnar 13: 2013 Flu vaccine: 01/03/21 Zostavax/Shingrix Discussed with patient  COVID19 SARS 2: 1/2 on 06/10/19 Hep B: 2016 Colonoscopy: 12/2013 Dr. Ardis Hughs Q10 years due 2025 Heart cath 04/2013 Hep C: 09/2013 Dr Ardis Hughs  Allergies:  Allergies  Allergen Reactions   Chantix [Varenicline]     Nausea   Lopid [Gemfibrozil]     Nausea/weakness   Medical History:  Past Medical History:  Diagnosis Date   Colon polyps    Diabetes mellitus without complication ([place])    Hepatitis C    treated for hepatatis but not sure what type   Hiatal hernia    Marijuana use    Osteoarthritis    Sarcoid     Thrombocytopenia ([place])    Tobacco abuse    SURGICAL HISTORY He  has a past surgical history that includes Appendectomy; HEAD TRAUMA (1975); and left heart catheterization with coronary angiogram (N/A, 05/17/2012). FAMILY HISTORY His family history includes Cancer in his father; Diabetes Mellitus II in his mother; Leukemia in his mother; Other in an other family member; Sarcoidosis in his paternal aunt and sister. SOCIAL HISTORY He  reports that he has been smoking cigarettes. He has a 15.00 pack-year smoking history. He has never used smokeless tobacco.  He reports current alcohol use of about 6.0 standard drinks of alcohol per week. He reports current drug use. Frequency: 2.00 times per week. Drug: Marijuana.  Review of Systems  Constitutional: Negative.  Negative for chills, diaphoresis, fever, malaise/fatigue and weight loss.  HENT:  Negative for congestion, ear discharge, ear pain, hearing loss, nosebleeds, sore throat and tinnitus.   Eyes: Negative.  Negative for blurred vision and double vision.  Respiratory:  Positive for shortness of breath and wheezing. Negative for cough and stridor.   Cardiovascular: Negative.  Negative for chest pain and leg swelling.  Gastrointestinal:  Positive for heartburn (controlled). Negative for abdominal pain, constipation, diarrhea, nausea and vomiting.  Genitourinary: Negative.  Negative for dysuria.  Musculoskeletal:  Positive for back pain and joint pain (knees). Negative for falls, myalgias and neck pain.  Skin: Negative.  Negative for rash.  Neurological: Negative.  Negative for dizziness, weakness and headaches.  Endo/Heme/Allergies:  Does not bruise/bleed easily.  Psychiatric/Behavioral:  Positive for depression. The patient does not have insomnia.      Physical Exam: Estimated body mass index is 30.81 kg/m as calculated from the following:   Height as of this encounter: 6' (1.829 m).   Weight as of this encounter: 227 lb 3.2 oz (103.1  kg). BP 112/68   Pulse (!) 103   Temp 97.9 F (36.6 C)   Ht 6' (1.829 m)   Wt 227 lb 3.2 oz (103.1 kg)   SpO2 96%   BMI 30.81 kg/m  General Appearance: Obese, in no apparent distress.  Eyes: PERRLA, EOMs, conjunctiva no swelling or erythema, normal fundi and vessels.   Neck: Supple, thyroid normal. No bruits  Respiratory: Respiratory effort normal, Expiratory wheezes throughout the lungs Cardio: RRR without murmurs, rubs or gallops. Brisk peripheral pulses without edema.  Chest: symmetric, with normal excursions and percussion.  Abdomen: Soft, nontender, obese no guarding, rebound, hernias, masses, or organomegaly. .  Lymphatics: Non tender without lymphadenopathy.  Genitourinary: defer Musculoskeletal: Full ROM all peripheral extremities,5/5 strength, and antalgic gait.  Neurological Exam: normal  Vascular Exam: normal. Skin: Warm, dry without rashes, lesions, ecchymosis. Neuro: Cranial nerves intact, reflexes equal bilaterally. Normal muscle tone, no cerebellar symptoms. Psych: Awake and oriented X 3, normal affect, Insight and Judgment appropriate.  EKG: NSR, no ST changes AAA: < 3 cm  Ruthetta Koopmann Mikki [place], NP 5:09 PM [facility]

## 2022-04-02 ENCOUNTER — Encounter: Payer: Self-pay | Admitting: Nurse Practitioner

## 2022-04-02 ENCOUNTER — Ambulatory Visit (INDEPENDENT_AMBULATORY_CARE_PROVIDER_SITE_OTHER): Payer: No Typology Code available for payment source | Admitting: Nurse Practitioner

## 2022-04-02 VITALS — BP 112/68 | HR 103 | Temp 97.9°F | Ht 72.0 in | Wt 227.2 lb

## 2022-04-02 DIAGNOSIS — E669 Obesity, unspecified: Secondary | ICD-10-CM

## 2022-04-02 DIAGNOSIS — I10 Essential (primary) hypertension: Secondary | ICD-10-CM | POA: Diagnosis not present

## 2022-04-02 DIAGNOSIS — J432 Centrilobular emphysema: Secondary | ICD-10-CM

## 2022-04-02 DIAGNOSIS — D869 Sarcoidosis, unspecified: Secondary | ICD-10-CM

## 2022-04-02 DIAGNOSIS — M459 Ankylosing spondylitis of unspecified sites in spine: Secondary | ICD-10-CM

## 2022-04-02 DIAGNOSIS — Z79899 Other long term (current) drug therapy: Secondary | ICD-10-CM

## 2022-04-02 DIAGNOSIS — E1165 Type 2 diabetes mellitus with hyperglycemia: Secondary | ICD-10-CM

## 2022-04-02 DIAGNOSIS — Z0001 Encounter for general adult medical examination with abnormal findings: Secondary | ICD-10-CM

## 2022-04-02 DIAGNOSIS — Z0000 Encounter for general adult medical examination without abnormal findings: Secondary | ICD-10-CM

## 2022-04-02 DIAGNOSIS — F3341 Major depressive disorder, recurrent, in partial remission: Secondary | ICD-10-CM

## 2022-04-02 DIAGNOSIS — E559 Vitamin D deficiency, unspecified: Secondary | ICD-10-CM

## 2022-04-02 DIAGNOSIS — Z1329 Encounter for screening for other suspected endocrine disorder: Secondary | ICD-10-CM

## 2022-04-02 DIAGNOSIS — M51369 Other intervertebral disc degeneration, lumbar region without mention of lumbar back pain or lower extremity pain: Secondary | ICD-10-CM

## 2022-04-02 DIAGNOSIS — Z136 Encounter for screening for cardiovascular disorders: Secondary | ICD-10-CM

## 2022-04-02 DIAGNOSIS — E1122 Type 2 diabetes mellitus with diabetic chronic kidney disease: Secondary | ICD-10-CM

## 2022-04-02 DIAGNOSIS — Z1389 Encounter for screening for other disorder: Secondary | ICD-10-CM

## 2022-04-02 DIAGNOSIS — E1169 Type 2 diabetes mellitus with other specified complication: Secondary | ICD-10-CM

## 2022-04-02 DIAGNOSIS — Z125 Encounter for screening for malignant neoplasm of prostate: Secondary | ICD-10-CM

## 2022-04-02 DIAGNOSIS — M5136 Other intervertebral disc degeneration, lumbar region: Secondary | ICD-10-CM

## 2022-04-02 DIAGNOSIS — D696 Thrombocytopenia, unspecified: Secondary | ICD-10-CM

## 2022-04-02 DIAGNOSIS — Z720 Tobacco use: Secondary | ICD-10-CM

## 2022-04-02 MED ORDER — BUPROPION HCL ER (XL) 150 MG PO TB24: ORAL_TABLET | ORAL | 3 refills | Status: DC

## 2022-04-02 NOTE — Patient Instructions (Addendum)
Managing the Challenge of Quitting Smoking Quitting smoking is a physical and mental challenge. You may have cravings, withdrawal symptoms, and temptation to smoke. Before quitting, work with your health care provider to make a plan that can help you manage quitting. Making a plan before you quit may keep you from smoking when you have the urge to smoke while trying to quit. How to manage lifestyle changes Managing stress Stress can make you want to smoke, and wanting to smoke may cause stress. It is important to find ways to manage your stress. You could try some of the following: Practice relaxation techniques. Breathe slowly and deeply, in through your nose and out through your mouth. Listen to music. Soak in a bath or take a shower. Imagine a peaceful place or vacation. Get some support. Talk with family or friends about your stress. Join a support group. Talk with a counselor or therapist. Get some physical activity. Go for a walk, run, or bike ride. Play a favorite sport. Practice yoga.  Medicines Talk with your health care provider about medicines that might help you deal with cravings and make quitting easier for you. Relationships Social situations can be difficult when you are quitting smoking. To manage this, you can: Avoid parties and other social situations where people might be smoking. Avoid alcohol. Leave right away if you have the urge to smoke. Explain to your family and friends that you are quitting smoking. Ask for support and let them know you might be a bit grumpy. Plan activities where smoking is not an option. General instructions Be aware that many people gain weight after they quit smoking. However, not everyone does. To keep from gaining weight, have a plan in place before you quit, and stick to the plan after you quit. Your plan should include: Eating healthy snacks. When you have a craving, it may help to: Eat popcorn, or try carrots, celery, or other cut  vegetables. Chew sugar-free gum. Changing how you eat. Eat small portion sizes at meals. Eat 4-6 small meals throughout the day instead of 1-2 large meals a day. Be mindful when you eat. You should avoid watching television or doing other things that might distract you as you eat. Exercising regularly. Make time to exercise each day. If you do not have time for a long workout, do short bouts of exercise for 5-10 minutes several times a day. Do some form of strengthening exercise, such as weight lifting. Do some exercise that gets your heart beating and causes you to breathe deeply, such as walking fast, running, swimming, or biking. This is very important. Drinking plenty of water or other low-calorie or no-calorie drinks. Drink enough fluid to keep your urine pale yellow.  How to recognize withdrawal symptoms Your body and mind may experience discomfort as you try to get used to not having nicotine in your system. These effects are called withdrawal symptoms. They may include: Feeling hungrier than normal. Having trouble concentrating. Feeling irritable or restless. Having trouble sleeping. Feeling depressed. Craving a cigarette. These symptoms may surprise you, but they are normal to have when quitting smoking. To manage withdrawal symptoms: Avoid places, people, and activities that trigger your cravings. Remember why you want to quit. Get plenty of sleep. Avoid coffee and other drinks that contain caffeine. These may worsen some of your symptoms. How to manage cravings Come up with a plan for how to deal with your cravings. The plan should include the following: A definition of the specific situation   you want to deal with. An activity or action you will take to replace smoking. A clear idea for how this action will help. The name of someone who could help you with this. Cravings usually last for 5-10 minutes. Consider taking the following actions to help you with your plan to deal  with cravings: Keep your mouth busy. Chew sugar-free gum. Suck on hard candies or a straw. Brush your teeth. Keep your hands and body busy. Change to a different activity right away. Squeeze or play with a ball. Do an activity or a hobby, such as making bead jewelry, practicing needlepoint, or working with wood. Mix up your normal routine. Take a short exercise break. Go for a quick walk, or run up and down stairs. Focus on doing something kind or helpful for someone else. Call a friend or family member to talk during a craving. Join a support group. Contact a quitline. Where to find support To get help or find a support group: Call the [facility]'s Smoking Quitline: 1-800-QUIT-NOW 236-097-6688) Text QUIT to SmokefreeTXT: 263785 Where to find more information Visit these websites to find more information on quitting smoking: U.S. Department of Health and Human Services: www.smokefree.gov American Lung Association: www.freedomfromsmoking.org Centers for Disease Control and Prevention (CDC): http://www.wolf.info/ American Heart Association: www.heart.org Contact a health care provider if: You want to change your plan for quitting. The medicines you are taking are not helping. Your eating feels out of control or you cannot sleep. You feel depressed or become very anxious. Summary Quitting smoking is a physical and mental challenge. You will face cravings, withdrawal symptoms, and temptation to smoke again. Preparation can help you as you go through these challenges. Try different techniques to manage stress, handle social situations, and prevent weight gain. You can deal with cravings by keeping your mouth busy (such as by chewing gum), keeping your hands and body busy, calling family or friends, or contacting a quitline for people who want to quit smoking. You can deal with withdrawal symptoms by avoiding places where people smoke, getting plenty of rest, and avoiding drinks that  contain caffeine. This information is not intended to replace advice given to you by your health care provider. Make sure you discuss any questions you have with your health care provider. Document Revised: 03/06/2021 Document Reviewed: 03/06/2021 Elsevier Patient Education  [place].   Bupropion Extended-Release Tablets (Depression/Mood Disorders) What is this medication? BUPROPION (byoo PROE pee on) treats depression. It increases norepinephrine and dopamine in the brain, hormones that help regulate mood. It belongs to a group of medications called NDRIs. This medicine may be used for other purposes; ask your health care provider or pharmacist if you have questions. COMMON BRAND NAME(S): Aplenzin, Budeprion XL, Forfivo XL, Wellbutrin XL What should I tell my care team before I take this medication? They need to know if you have any of these conditions: An eating disorder, such as anorexia or bulimia Bipolar disorder or psychosis Diabetes or high blood sugar, treated with medication Glaucoma Head injury or brain tumor Heart disease, previous heart attack, or irregular heart beat High blood pressure Kidney or liver disease Seizures (convulsions) Suicidal thoughts or a previous suicide attempt Tourette's syndrome Weight loss An unusual or allergic reaction to bupropion, other medications, foods, dyes, or preservatives Pregnant or trying to become pregnant Breast-feeding How should I use this medication? Take this medication by mouth with a glass of water. Follow the directions on the prescription label. You can take it  with or without food. If it upsets your stomach, take it with food. Do not crush, chew, or cut these tablets. This medication is taken once daily at the same time each day. Do not take your medication more often than directed. Do not stop taking this medication suddenly except upon the advice of your care team. Stopping this medication too quickly may cause  serious side effects or your condition may worsen. A special MedGuide will be given to you by the pharmacist with each prescription and refill. Be sure to read this information carefully each time. Talk to your care team about the use of this medication in children. Special care may be needed. Overdosage: If you think you have taken too much of this medicine contact a poison control center or emergency room at once. NOTE: This medicine is only for you. Do not share this medicine with others. What if I miss a dose? If you miss a dose, skip the missed dose and take your next tablet at the regular time. Do not take double or extra doses. What may interact with this medication? Do not take this medication with any of the following: Linezolid MAOIs like Azilect, Carbex, Eldepryl, Marplan, Nardil, and Parnate Methylene blue (injected into a vein) Other medications that contain bupropion like Zyban This medication may also interact with the following: Alcohol Certain medications for anxiety or sleep Certain medications for blood pressure like metoprolol, propranolol Certain medications for depression or psychotic disturbances Certain medications for HIV or AIDS like efavirenz, lopinavir, nelfinavir, ritonavir Certain medications for irregular heart beat like propafenone, flecainide Certain medications for Parkinson's disease like amantadine, levodopa Certain medications for seizures like carbamazepine, phenytoin, phenobarbital Cimetidine Clopidogrel Cyclophosphamide Digoxin Furazolidone Isoniazid Nicotine Orphenadrine Procarbazine Steroid medications like prednisone or cortisone Stimulant medications for attention disorders, weight loss, or to stay awake Tamoxifen Theophylline Thiotepa Ticlopidine Tramadol Warfarin This list may not describe all possible interactions. Give your health care provider a list of all the medicines, herbs, non-prescription drugs, or dietary supplements you  use. Also tell them if you smoke, drink alcohol, or use illegal drugs. Some items may interact with your medicine. What should I watch for while using this medication? Tell your care team if your symptoms do not get better or if they get worse. Visit your care team for regular checks on your progress. Because it may take several weeks to see the full effects of this medication, it is important to continue your treatment as prescribed. Watch for new or worsening thoughts of suicide or depression. This includes sudden changes in mood, behavior, or thoughts. These changes can happen at any time but are more common in the beginning of treatment or after a change in dose. Call your care team right away if you experience these thoughts or worsening depression. Manic episodes may happen in patients with bipolar disorder who take this medication. Watch for changes in feelings or behaviors such as feeling anxious, nervous, agitated, panicky, irritable, hostile, aggressive, impulsive, severely restless, overly excited and hyperactive, or trouble sleeping. These symptoms can happen at anytime but are more common in the beginning of treatment or after a change in dose. Call your care team right away if you notice any of these symptoms. This medication may cause serious skin reactions. They can happen weeks to months after starting the medication. Contact your care team right away if you notice fevers or flu-like symptoms with a rash. The rash may be red or purple and then turn into blisters or  peeling of the skin. Or, you might notice a red rash with swelling of the face, lips or lymph nodes in your neck or under your arms. Avoid drinks that contain alcohol while taking this medication. Drinking large amounts of alcohol, using sleeping or anxiety medications, or quickly stopping the use of these agents while taking this medication may increase your risk for a seizure. Do not drive or use heavy machinery until you know  how this medication affects you. This medication can impair your ability to perform these tasks. Do not take this medication close to bedtime. It may prevent you from sleeping. Your mouth may get dry. Chewing sugarless gum or sucking hard candy, and drinking plenty of water may help. Contact your care team if the problem does not go away or is severe. The tablet shell for some brands of this medication does not dissolve. This is normal. The tablet shell may appear whole in the stool. This is not a cause for concern. What side effects may I notice from receiving this medication? Side effects that you should report to your care team as soon as possible: Allergic reactions--skin rash, itching, hives, swelling of the face, lips, tongue, or throat Increase in blood pressure Mood and behavior changes--anxiety, nervousness, confusion, hallucinations, irritability, hostility, thoughts of suicide or self-harm, worsening mood, feelings of depression Redness, blistering, peeling, or loosening of the skin, including inside the mouth Seizures Sudden eye pain or change in vision such as blurry vision, seeing halos around lights, vision loss Side effects that usually do not require medical attention (report to your care team if they continue or are bothersome): Constipation Dizziness Dry mouth Loss of appetite Nausea Tremors or shaking Trouble sleeping This list may not describe all possible side effects. Call your doctor for medical advice about side effects. You may report side effects to FDA at 1-800-FDA-1088. Where should I keep my medication? Keep out of the reach of children and pets. Store at room temperature between 15 and 30 degrees C (59 and 86 degrees F). Throw away any unused medication after the expiration date. NOTE: This sheet is a summary. It may not cover all possible information. If you have questions about this medicine, talk to your doctor, pharmacist, or health care provider.  2023  Elsevier/Gold Standard (2020-03-17 00:00:00)

## 2022-04-03 LAB — HEMOGLOBIN A1C
Hgb A1c MFr Bld: 5.4 % of total Hgb (ref <5.7)
Mean Plasma Glucose: 108 mg/dL
eAG (mmol/L): 6 mmol/L

## 2022-04-03 LAB — MAGNESIUM: Magnesium: 2 mg/dL (ref 1.5–2.5)

## 2022-04-03 LAB — MICROSCOPIC MESSAGE

## 2022-04-03 LAB — LIPID PANEL
Cholesterol: 82 mg/dL (ref <200)
HDL: 27 mg/dL — ABNORMAL LOW (ref >=40)
LDL Cholesterol (Calc): 36 mg/dL (calc)
Non-HDL Cholesterol (Calc): 55 mg/dL (calc) (ref <130)
Total CHOL/HDL Ratio: 3 (calc) (ref <5.0)
Triglycerides: 105 mg/dL (ref <150)

## 2022-04-03 LAB — CBC WITH DIFFERENTIAL/PLATELET
Absolute Monocytes: 743 cells/uL (ref 200–950)
Basophils Absolute: 90 cells/uL (ref 0–200)
Basophils Relative: 1.2 %
Eosinophils Absolute: 300 cells/uL (ref 15–500)
Eosinophils Relative: 4 %
HCT: 47.4 % (ref 38.5–50.0)
Hemoglobin: 15.7 g/dL (ref 13.2–17.1)
Lymphs Abs: 1950 cells/uL (ref 850–3900)
MCH: 27.6 pg (ref 27.0–33.0)
MCHC: 33.1 g/dL (ref 32.0–36.0)
MCV: 83.5 fL (ref 80.0–100.0)
MPV: 10.3 fL (ref 7.5–12.5)
Monocytes Relative: 9.9 %
Neutro Abs: 4418 cells/uL (ref 1500–7800)
Neutrophils Relative %: 58.9 %
Platelets: 257 10*3/uL (ref 140–400)
RBC: 5.68 10*6/uL (ref 4.20–5.80)
RDW: 12.8 % (ref 11.0–15.0)
Total Lymphocyte: 26 %
WBC: 7.5 10*3/uL (ref 3.8–10.8)

## 2022-04-03 LAB — COMPLETE METABOLIC PANEL WITH GFR
AG Ratio: 1.8 (calc) (ref 1.0–2.5)
ALT: 21 U/L (ref 9–46)
AST: 19 U/L (ref 10–35)
Albumin: 4.4 g/dL (ref 3.6–5.1)
Alkaline phosphatase (APISO): 88 U/L (ref 35–144)
BUN: 11 mg/dL (ref 7–25)
CO2: 22 mmol/L (ref 20–32)
Calcium: 9.3 mg/dL (ref 8.6–10.3)
Chloride: 107 mmol/L (ref 98–110)
Creat: 0.9 mg/dL (ref 0.70–1.35)
Globulin: 2.5 g/dL (calc) (ref 1.9–3.7)
Glucose, Bld: 97 mg/dL (ref 65–99)
Potassium: 4.5 mmol/L (ref 3.5–5.3)
Sodium: 139 mmol/L (ref 135–146)
Total Bilirubin: 0.6 mg/dL (ref 0.2–1.2)
Total Protein: 6.9 g/dL (ref 6.1–8.1)
eGFR: 95 mL/min/{1.73_m2} (ref >=60)

## 2022-04-03 LAB — PSA: PSA: 1.99 ng/mL (ref <=4.00)

## 2022-04-03 LAB — URINALYSIS, ROUTINE W REFLEX MICROSCOPIC
Bacteria, UA: NONE SEEN /HPF
Bilirubin Urine: NEGATIVE
Glucose, UA: NEGATIVE
Hgb urine dipstick: NEGATIVE
Hyaline Cast: NONE SEEN /LPF
Ketones, ur: NEGATIVE
Leukocytes,Ua: NEGATIVE
Nitrite: NEGATIVE
RBC / HPF: NONE SEEN /HPF (ref 0–2)
Specific Gravity, Urine: 1.023 (ref 1.001–1.035)
pH: <=5 (ref 5.0–8.0)

## 2022-04-03 LAB — MICROALBUMIN / CREATININE URINE RATIO
Creatinine, Urine: 222 mg/dL (ref 20–320)
Microalb Creat Ratio: 33 mcg/mg creat — ABNORMAL HIGH (ref <30)
Microalb, Ur: 7.4 mg/dL

## 2022-04-03 LAB — VITAMIN D 25 HYDROXY (VIT D DEFICIENCY, FRACTURES): Vit D, 25-Hydroxy: 41 ng/mL (ref 30–100)

## 2022-04-03 LAB — TSH: TSH: 0.8 mIU/L (ref 0.40–4.50)

## 2022-04-20 ENCOUNTER — Telehealth: Payer: Self-pay | Admitting: Nurse Practitioner

## 2022-04-20 ENCOUNTER — Other Ambulatory Visit: Payer: Self-pay | Admitting: Nurse Practitioner

## 2022-04-20 MED ORDER — FAMOTIDINE 40 MG PO TABS: ORAL_TABLET | ORAL | 1 refills | Status: DC

## 2022-04-20 NOTE — Telephone Encounter (Signed)
Patient is requesting a refill on Famotidine to [facility] on [address].

## 2022-04-25 ENCOUNTER — Encounter: Payer: Self-pay | Admitting: Internal Medicine

## 2022-04-25 NOTE — Patient Instructions (Signed)
Stye ?A stye, also known as a hordeolum, is a bump that forms on an eyelid. It may look like a pimple next to the eyelash. A stye can form inside the eyelid (internal stye) or outside the eyelid (external stye). A stye can cause redness, swelling, and pain on the eyelid. ?Styes are very common. Anyone can get them at any age. They usually occur in just one eye at a time, but you may have more than one in either eye. ?What are the causes? ?A stye is caused by an infection. The infection is almost always caused by bacteria called Staphylococcus aureus. This is a common type of bacteria that lives on the skin. ?An internal stye may result from an infected oil-producing gland inside the eyelid. An external stye may be caused by an infection at the base of the eyelash (hair follicle). ?What increases the risk? ?You are more likely to develop a stye if: ?You have had a stye before. ?You have any of these conditions: ?Red, itchy, inflamed eyelids (blepharitis). ?A skin condition such as seborrheic dermatitis or rosacea. ?High fat levels in your blood (lipids). ?Dry eyes. ?What are the signs or symptoms? ?The most common symptom of a stye is eyelid pain. Internal styes are more painful than external styes. Other symptoms may include: ?Painful swelling of your eyelid. ?A scratchy feeling in your eye. ?Tearing and redness of your eye. ?A pimple-like bump on the edge of the eyelid. ?Pus draining from the stye. ?How is this diagnosed? ?Your health care provider may be able to diagnose a stye just by examining your eye. The health care provider may also check to make sure: ?You do not have a fever or other signs of a more serious infection. ?The infection has not spread to other parts of your eye or areas around your eye. ?How is this treated? ?Most styes will clear up in a few days without treatment or with warm compresses applied to the area. You may need to use antibiotic drops or ointment to treat an infection. Sometimes,  steroid drops or ointment are used in addition to antibiotics. ?In some cases, your health care provider may give you a small steroid injection in the eyelid. ?If your stye does not heal with routine treatment, your health care provider may drain pus from the stye using a thin blade or needle. This may be done if the stye is large, causing a lot of pain, or affecting your vision. ?Follow these instructions at home: ?Take over-the-counter and prescription medicines only as told by your health care provider. This includes eye drops or ointments. ?If you were prescribed an antibiotic medicine, steroid medicine, or both, apply or use them as told by your health care provider. Do not stop using the medicine even if your condition improves. ?Apply a warm, wet cloth (warm compress) to your eye for 5-10 minutes, 4 to 6 times a day. ?Clean the affected eyelid as directed by your health care provider. ?Do not wear contact lenses or eye makeup until your stye has healed and your health care provider says that it is safe. ?Do not try to pop or drain the stye. ?Do not rub your eye. ?Contact a health care provider if: ?You have chills or a fever. ?Your stye does not go away after several days. ?Your stye affects your vision. ?Your eyeball becomes swollen, red, or painful. ?Get help right away if: ?You have pain when moving your eye around. ?Summary ?A stye is a bump that forms   on an eyelid. It may look like a pimple next to the eyelash. ?A stye can form inside the eyelid (internal stye) or outside the eyelid (external stye). A stye can cause redness, swelling, and pain on the eyelid. ?Your health care provider may be able to diagnose a stye just by examining your eye. ?Apply a warm, wet cloth (warm compress) to your eye for 5-10 minutes, 4 to 6 times a day. ?This information is not intended to replace advice given to you by your health care provider. Make sure you discuss any questions you have with your health care  provider. ?Document Revised: 05/21/2020 Document Reviewed: 05/21/2020 ?Elsevier Patient Education ? 2023 Elsevier Inc. ? ?

## 2022-04-25 NOTE — Progress Notes (Unsigned)
Future Appointments  Date Time Provider Department  04/26/2022 11:00 AM Unk Pinto, MD [facility]  05/12/2022 10:00 AM Rigoberto Noel, MD [facility]  07/12/2022  9:30 AM Alycia Rossetti, NP [facility]  04/05/2023 10:00 AM Alycia Rossetti, NP [facility]    History of Present Illness:      Patient is a very nice 67  yo MBM with HTN, HLD, T2_NIDDM  who presents with concerns possible stye Lt eye .   Current Outpatient Medications on File Prior to Visit  Medication Sig   Adalimumab (HUMIRA PEN [place]) Inject into the skin every 14 (fourteen) days.   albuterol (VENTOLIN HFA) 108 (90 Base) MCG/ACT inhaler INHALE 2 PUFFS INTO THE LUNGS EVERY 2 HOURS AS NEEDED FOR WHEEZING OR SHORTNESS OF BREATH (COUGH).   blood glucose meter kit and supplies KIT Dispense based on patient and insurance preference. Use up to four times daily as directed.   buPROPion (WELLBUTRIN XL) 150 MG 24 hr tablet Take 1 tablet  every Morning  for Mood, Focus & Concentration   famotidine (PEPCID) 40 MG tablet TAKE 1 TABLET BY MOUTH EVERY DAY IN THE EVENING   glipiZIDE (GLUCOTROL) 5 MG tablet Take 1/2 tab daily with breakfast if fasting glucose is 130+. Do not take if not eating or having low blood sugars. Take a second 1/2 tab with dinner if fasting remains persistently above 130.   glucose blood test strip Check blood sugar 1 time daily-DX-E11.9   Lancets (ONETOUCH DELICA PLUS [id number]) MISC Check blood sugar 1 time daily-E11.9   losartan (COZAAR) 25 MG tablet Take 1 tab daily for blood pressure and diabetes kidney protection.   metFORMIN (GLUCOPHAGE-XR) 500 MG 24 hr tablet TAKE 2 TABLETS 2 X /DAY WITH MEALS FOR DIABETES   naproxen (NAPROSYN) 500 MG tablet Take 500 mg by mouth 2 (two) times daily with a meal. Dr. Kathlene November prescribing.   rosuvastatin (CRESTOR) 20 MG tablet Take 1 tab daily for cholesterol.   tirzepatide (MOUNJARO) 5 MG/0.5ML Pen INJECT 5 MG SUBCUTANEOUSLY WEEKLY   VITAMIN D, CHOLECALCIFEROL, PO Take 10,000  Units by mouth daily.   pantoprazole (PROTONIX) 40 MG tablet Take 1 tablet (40 mg total) by mouth daily as needed.   sulfaSALAzine (AZULFIDINE) 500 MG tablet Take 500 mg by mouth 2 (two) times daily. (Patient not taking: Reported on 04/26/2022)   No current facility-administered medications on file prior to visit.    Allergies  Allergen Reactions   Chantix [Varenicline]     Nausea   Lopid [Gemfibrozil]     Nausea/weakness     Problem list He has Sarcoidosis; Hyperlipidemia due to type 2 diabetes mellitus ([place]); Overweight; Hepatitis C; Tobacco abuse; Hiatal hernia; Thrombocytopenia ([place]); Colon polyps; DDD (degenerative disc disease), lumbar; Essential hypertension; Medication management; Depression; Acid reflux; Umbilical hernia without obstruction and without gangrene; Diabetes mellitus type 2 in obese ([place]); Vitamin D deficiency; Centrilobular emphysema ([facility]); and Microalbuminuria due to type 2 diabetes mellitus ([place]) on their problem list.   Observations/Objective:  BP 126/62   Pulse 89   Temp 97.9 F (36.6 C)   Resp 16   Ht 6' (1.829 m)   Wt 229 lb 6.4 oz (104.1 kg)   SpO2 95%   BMI 31.11 kg/m   HEENT - EACs/TMs - Nl   EOMs conjugate. PERRLA. Rt eye - Nl. Lt eye 1+ conjunctivitis.            There is a small 3-4 mm Stye along the medial 1/3  of the left lower eyelid Neck - supple.  Chest - Clear equal BS. Cor - Nl HS. RRR w/o sig MGR. PP 1(+). No edema. MS- FROM w/o deformities.  Gait Nl. Neuro -  Nl w/o focal abnormalities.   Assessment and Plan:   1. Hordeolum externum of left lower eyelid  - neomycin-polymyxin b-dexamethasone (MAXITROL) 3.5-10000-0.1 SUSP;  Instill 1  to 2 drops to the  affected eye every 2 hours today, then tomorrow start 4 x /day   Dispense: 5 mL; Refill: 1  - doxycycline  100 MG capsule;  Take 1 capsule 2 x /day with Food  for Infection   Dispense: 20 capsule; Refill: 0      Follow Up Instructions:        I discussed the  assessment and treatment plan with the patient. The patient was provided an opportunity to ask questions and all were answered. The patient agreed with the plan and demonstrated an understanding of the instructions.       The patient was advised to call back or seek an in-person evaluation if the symptoms worsen or if the condition fails to improve as anticipated.    Kirtland Bouchard, MD

## 2022-04-26 ENCOUNTER — Ambulatory Visit (INDEPENDENT_AMBULATORY_CARE_PROVIDER_SITE_OTHER): Payer: No Typology Code available for payment source | Admitting: Internal Medicine

## 2022-04-26 ENCOUNTER — Encounter: Payer: Self-pay | Admitting: Internal Medicine

## 2022-04-26 VITALS — BP 126/62 | HR 89 | Temp 97.9°F | Resp 16 | Ht 72.0 in | Wt 229.4 lb

## 2022-04-26 DIAGNOSIS — H00015 Hordeolum externum left lower eyelid: Secondary | ICD-10-CM

## 2022-04-26 DIAGNOSIS — H00019 Hordeolum externum unspecified eye, unspecified eyelid: Secondary | ICD-10-CM

## 2022-04-26 MED ORDER — DOXYCYCLINE HYCLATE 100 MG PO CAPS: ORAL_CAPSULE | ORAL | 0 refills | Status: DC

## 2022-04-26 MED ORDER — NEOMYCIN-POLYMYXIN-DEXAMETH 3.5-10000-0.1 OP SUSP: OPHTHALMIC | 1 refills | Status: DC

## 2022-05-12 ENCOUNTER — Ambulatory Visit (HOSPITAL_BASED_OUTPATIENT_CLINIC_OR_DEPARTMENT_OTHER): Payer: No Typology Code available for payment source | Admitting: Pulmonary Disease

## 2022-05-12 ENCOUNTER — Encounter (HOSPITAL_BASED_OUTPATIENT_CLINIC_OR_DEPARTMENT_OTHER): Payer: Self-pay | Admitting: Pulmonary Disease

## 2022-05-12 VITALS — BP 118/78 | HR 90 | Ht 72.0 in | Wt 226.6 lb

## 2022-05-12 DIAGNOSIS — D869 Sarcoidosis, unspecified: Secondary | ICD-10-CM

## 2022-05-12 DIAGNOSIS — Z720 Tobacco use: Secondary | ICD-10-CM

## 2022-05-12 NOTE — Progress Notes (Signed)
   Subjective:    Patient ID: Jerry Stewart, male    DOB: 01-25-1956, 67 y.o.   MRN: [id number]  HPI 67 year old smoker for follow-up of sarcoidosis He was diagnosed by mediastinoscopy biopsy 1995 when he presented with hilar lymphadenopathy.    PMH -diabetes type 2, hepatitis C, OA knees ,seronegative (rheum ) He smokes half pack per day, starting at age 54, more than 25 pack years  1 year follow-up visit On his last visit in January 2023, we requested PFTs but this has not been done.  I also requested a low-dose CT chest but he has not scheduled this. He continues to smoke half pack per day.  Chantix gave him nightmares He is on Humira injections every 2 weeks. He takes albuterol on an as-needed basis.  Breathing has been stable except for intermittent wheezing He continues follow-up with rheumatology and has been diagnosed with psoriatic arthritis   Significant tests/ events reviewed   LDCT 06/2020 >> centrilobular and paraseptal emphysema, calcified lymph nodes at the thoracic inlet, mediastinum and hilar, paraesophageal.  Scattered tiny nodules along pleura most of which are calcified.  Several noncalcified parenchymal nodules largest 6 mm   PFTs 10/2011 >> no airway obstruction, TLC 77%, DLCO 82%  Review of Systems neg for any significant sore throat, dysphagia, itching, sneezing, nasal congestion or excess/ purulent secretions, fever, chills, sweats, unintended wt loss, pleuritic or exertional cp, hempoptysis, orthopnea pnd or change in chronic leg swelling. Also denies presyncope, palpitations, heartburn, abdominal pain, nausea, vomiting, diarrhea or change in bowel or urinary habits, dysuria,hematuria, rash, arthralgias, visual complaints, headache, numbness weakness or ataxia.     Objective:   Physical Exam  Gen. Pleasant, well-nourished, in no distress ENT - no thrush, no pallor/icterus,no post nasal drip Neck: No JVD, no thyromegaly, no carotid bruits Lungs: no use of  accessory muscles, no dullness to percussion, clear without rales or rhonchi  Cardiovascular: Rhythm regular, heart sounds  normal, no murmurs or gallops, no peripheral edema Musculoskeletal: No deformities, no cyanosis or clubbing        Assessment & Plan:

## 2022-05-12 NOTE — Patient Instructions (Addendum)
  X schedule PFTs  X LDCT chest for screening  You have to QUIT smoking

## 2022-05-12 NOTE — Assessment & Plan Note (Signed)
Appears to be in remission. We will reassess with PFTs and CT chest

## 2022-05-12 NOTE — Assessment & Plan Note (Signed)
Smoking cessation was discussed as the most important intervention that would add years to his life. He has been unable to tolerate Chantix.  He will trial nicotine patches, he is already on Wellbutrin. He will qualify for low-dose CT chest and we will schedule.  I discussed risks and benefits he has already undergone screening in 2022 that this would be a 2-year follow-up

## 2022-06-02 ENCOUNTER — Ambulatory Visit (HOSPITAL_BASED_OUTPATIENT_CLINIC_OR_DEPARTMENT_OTHER): Payer: No Typology Code available for payment source | Admitting: Pulmonary Disease

## 2022-06-02 DIAGNOSIS — D869 Sarcoidosis, unspecified: Secondary | ICD-10-CM | POA: Diagnosis not present

## 2022-06-02 LAB — PULMONARY FUNCTION TEST
DL/VA % pred: 109 %
DL/VA: 4.52 ml/min/mmHg/L
DLCO cor % pred: 86 %
DLCO cor: 24.66 ml/min/mmHg
DLCO unc % pred: 86 %
DLCO unc: 24.66 ml/min/mmHg
FEF 25-75 Post: 2.8 L/sec
FEF 25-75 Pre: 2.64 L/sec
FEF2575-%Change-Post: 5 %
FEF2575-%Pred-Post: 94 %
FEF2575-%Pred-Pre: 89 %
FEV1-%Change-Post: 1 %
FEV1-%Pred-Post: 83 %
FEV1-%Pred-Pre: 81 %
FEV1-Post: 3.1 L
FEV1-Pre: 3.06 L
FEV1FVC-%Change-Post: 5 %
FEV1FVC-%Pred-Pre: 103 %
FEV6-%Change-Post: -3 %
FEV6-%Pred-Post: 80 %
FEV6-%Pred-Pre: 83 %
FEV6-Post: 3.81 L
FEV6-Pre: 3.95 L
FEV6FVC-%Change-Post: 0 %
FEV6FVC-%Pred-Post: 105 %
FEV6FVC-%Pred-Pre: 105 %
FVC-%Change-Post: -3 %
FVC-%Pred-Post: 76 %
FVC-%Pred-Pre: 79 %
FVC-Post: 3.82 L
FVC-Pre: 3.95 L
Post FEV1/FVC ratio: 81 %
Post FEV6/FVC ratio: 100 %
Pre FEV1/FVC ratio: 77 %
Pre FEV6/FVC Ratio: 100 %
RV % pred: 99 %
RV: 2.41 L
TLC % pred: 85 %
TLC: 6.36 L

## 2022-06-02 NOTE — Progress Notes (Signed)
Full PFT Performed Today. 

## 2022-06-02 NOTE — Patient Instructions (Signed)
Full PFT Performed Today. 

## 2022-06-04 ENCOUNTER — Other Ambulatory Visit: Payer: Self-pay | Admitting: Nurse Practitioner

## 2022-06-04 DIAGNOSIS — E785 Hyperlipidemia, unspecified: Secondary | ICD-10-CM

## 2022-06-09 ENCOUNTER — Other Ambulatory Visit: Payer: Self-pay | Admitting: Nurse Practitioner

## 2022-06-09 DIAGNOSIS — E1165 Type 2 diabetes mellitus with hyperglycemia: Secondary | ICD-10-CM

## 2022-06-09 NOTE — Progress Notes (Unsigned)
Assessment and Plan: Jerry Stewart was seen today for headache.  Diagnoses and all orders for this visit:  Tension headache Try Fioricet and if it continues and does not resolve then notify the office and will refer to neurology -     butalbital-acetaminophen-caffeine (FIORICET) 50-325-40 MG tablet; Take 1-2 tablets by mouth every 6 (six) hours as needed for headache.  Tobacco abuse Continue to cut down and stop smoking     Further disposition pending results of labs. Discussed med's effects and SE's.   Over 30 minutes of exam, counseling, chart review, and critical decision making was performed.   Future Appointments  Date Time Provider [place]  06/23/2022  9:30 AM [facility]  07/12/2022  9:30 AM Alycia Rossetti, NP [facility] None  04/05/2023 10:00 AM Alycia Rossetti, NP [facility] None    ------------------------------------------------------------------------------------------------------------------   HPI BP 104/62   Pulse 87   Temp 97.9 F (36.6 C)   Ht 6' (1.829 m)   Wt 224 lb 9.6 oz (101.9 kg)   SpO2 96%   BMI 30.46 kg/m   67 y.o.male presents for headache that began yesterday on the right side with pain that went from temple to his teeth.  Describes as a sharp pain. He took Advil and Tylenol yesterday which did help somewhat.  Still has dull headache today. Denies sinus congestion  He has been to the pulmonary doctor and continues to be evaluated.  He is trying to decrease his smoking .  Down from 7 packs a week to 4 packs a week.   Past Medical History:  Diagnosis Date   Colon polyps    Diabetes mellitus without complication ([place])    Hepatitis C    treated for hepatatis but not sure what type   Hiatal hernia    Marijuana use    Osteoarthritis    Sarcoid    Thrombocytopenia ([facility])    Tobacco abuse      Allergies  Allergen Reactions   Chantix [Varenicline]     Nausea   Lopid [Gemfibrozil]     Nausea/weakness    Current Outpatient  Medications on File Prior to Visit  Medication Sig   Adalimumab (HUMIRA PEN [place]) Inject into the skin every 14 (fourteen) days.   albuterol (VENTOLIN HFA) 108 (90 Base) MCG/ACT inhaler INHALE 2 PUFFS INTO THE LUNGS EVERY 2 HOURS AS NEEDED FOR WHEEZING OR SHORTNESS OF BREATH (COUGH).   blood glucose meter kit and supplies KIT Dispense based on patient and insurance preference. Use up to four times daily as directed.   buPROPion (WELLBUTRIN XL) 150 MG 24 hr tablet Take 1 tablet  every Morning  for Mood, Focus & Concentration   famotidine (PEPCID) 40 MG tablet TAKE 1 TABLET BY MOUTH EVERY DAY IN THE EVENING   glipiZIDE (GLUCOTROL) 5 MG tablet Take 1/2 tab daily with breakfast if fasting glucose is 130+. Do not take if not eating or having low blood sugars. Take a second 1/2 tab with dinner if fasting remains persistently above 130.   losartan (COZAAR) 25 MG tablet Take 1 tab daily for blood pressure and diabetes kidney protection.   metFORMIN (GLUCOPHAGE-XR) 500 MG 24 hr tablet TAKE 2 TABLETS 2 X /DAY WITH MEALS FOR DIABETES   rosuvastatin (CRESTOR) 20 MG tablet Take 1 tab daily for cholesterol.   tirzepatide (MOUNJARO) 5 MG/0.5ML Pen INJECT 5 MG SUBCUTANEOUSLY WEEKLY   VITAMIN D, CHOLECALCIFEROL, PO Take 10,000 Units by mouth daily.   doxycycline (VIBRAMYCIN) 100  MG capsule Take 1 capsule 2 x /day with Food  for Infection (Patient not taking: Reported on 05/12/2022)   glucose blood test strip Check blood sugar 1 time daily-DX-E11.9 (Patient not taking: Reported on 05/12/2022)   Lancets (ONETOUCH DELICA PLUS 123XX123) MISC Check blood sugar 1 time daily-E11.9 (Patient not taking: Reported on 05/12/2022)   naproxen (NAPROSYN) 500 MG tablet Take 500 mg by mouth 2 (two) times daily with a meal. Dr. Kathlene November prescribing. (Patient not taking: Reported on 05/12/2022)   neomycin-polymyxin b-dexamethasone (MAXITROL) 3.5-10000-0.1 SUSP Instill 1  to 2 drops to the  affected eye every 2 hours today, then tomorrow start  4 x /day (Patient not taking: Reported on 05/12/2022)   pantoprazole (PROTONIX) 40 MG tablet Take 1 tablet (40 mg total) by mouth daily as needed.   sulfaSALAzine (AZULFIDINE) 500 MG tablet Take 500 mg by mouth 2 (two) times daily. (Patient not taking: Reported on 04/26/2022)   No current facility-administered medications on file prior to visit.    ROS: all negative except above.   Physical Exam:  BP 104/62   Pulse 87   Temp 97.9 F (36.6 C)   Ht 6' (1.829 m)   Wt 224 lb 9.6 oz (101.9 kg)   SpO2 96%   BMI 30.46 kg/m   General Appearance: Well nourished, in no apparent distress. Eyes: PERRLA, EOMs, conjunctiva no swelling or erythema Sinuses: No Frontal/maxillary tenderness ENT/Mouth: Ext aud canals clear, TMs without erythema, bulging. No erythema, swelling, or exudate on post pharynx.  Tonsils not swollen or erythematous. Hearing normal.  Neck: Supple, thyroid normal.  Respiratory: Respiratory effort normal, BS equal bilaterally without rales, rhonchi, wheezing or stridor.  Cardio: RRR with no MRGs. Brisk peripheral pulses without edema.  Abdomen: Soft, + BS.  Non tender, no guarding, rebound, hernias, masses. Lymphatics: Non tender without lymphadenopathy.  Musculoskeletal: Full ROM, 5/5 strength, normal gait.  Skin: Warm, dry without rashes, lesions, ecchymosis.  Neuro: Cranial nerves intact. Normal muscle tone, no cerebellar symptoms. Sensation intact.  Psych: Awake and oriented X 3, normal affect, Insight and Judgment appropriate.     Alycia Rossetti, NP 9:23 AM [facility]

## 2022-06-10 ENCOUNTER — Ambulatory Visit (HOSPITAL_BASED_OUTPATIENT_CLINIC_OR_DEPARTMENT_OTHER): Payer: No Typology Code available for payment source

## 2022-06-10 ENCOUNTER — Encounter: Payer: Self-pay | Admitting: Nurse Practitioner

## 2022-06-10 ENCOUNTER — Ambulatory Visit (INDEPENDENT_AMBULATORY_CARE_PROVIDER_SITE_OTHER): Payer: No Typology Code available for payment source | Admitting: Nurse Practitioner

## 2022-06-10 VITALS — BP 104/62 | HR 87 | Temp 97.9°F | Ht 72.0 in | Wt 224.6 lb

## 2022-06-10 DIAGNOSIS — G44209 Tension-type headache, unspecified, not intractable: Secondary | ICD-10-CM | POA: Diagnosis not present

## 2022-06-10 DIAGNOSIS — Z720 Tobacco use: Secondary | ICD-10-CM

## 2022-06-10 MED ORDER — BUTALBITAL-APAP-CAFFEINE 50-325-40 MG PO TABS: 1.0000 | ORAL_TABLET | Freq: Four times a day (QID) | ORAL | 0 refills | Status: AC | PRN

## 2022-06-10 NOTE — Patient Instructions (Signed)
Tension Headache, Adult ?A tension headache is a feeling of pain, pressure, or aching over the front and sides of the head. The pain can be dull, or it can feel tight. There are two types of tension headache: ?Episodic tension headache. This is when the headaches happen fewer than 15 days a month. ?Chronic tension headache. This is when the headaches happen more than 15 days a month during a 3-month period. ?A tension headache can last from 30 minutes to several days. It is the most common kind of headache. Tension headaches are not normally associated with nausea or vomiting, and they do not get worse with physical activity. ?What are the causes? ?The exact cause of this condition is not known. Tension headaches are often triggered by stress, anxiety, or depression. Other triggers may include: ?Alcohol. ?Too much caffeine or caffeine withdrawal. ?Respiratory infections, such as colds, flu, or sinus infections. ?Dental problems or teeth clenching. ?Fatigue. ?Holding your head and neck in the same position for a long period of time, such as while using a computer. ?Smoking. ?Arthritis of the neck. ?What are the signs or symptoms? ?Symptoms of this condition include: ?A feeling of pressure or tightness around the head. ?Dull, aching head pain. ?Pain over the front and sides of the head. ?Tenderness in the muscles of the head, neck, and shoulders. ?How is this diagnosed? ?This condition may be diagnosed based on your symptoms, your medical history, and a physical exam. ?If your symptoms are severe or unusual, you may have imaging tests, such as a CT scan or an MRI of your head. Your vision may also be checked. ?How is this treated? ?This condition may be treated with lifestyle changes and with medicines that help relieve symptoms. ?Follow these instructions at home: ?Managing pain ?Take over-the-counter and prescription medicines only as told by your health care provider. ?When you have a headache, lie down in a dark,  quiet room. ?If directed, put ice on your head and neck. To do this: ?Put ice in a plastic bag. ?Place a towel between your skin and the bag. ?Leave the ice on for 20 minutes, 2-3 times a day. ?Remove the ice if your skin turns bright red. This is very important. If you cannot feel pain, heat, or cold, you have a greater risk of damage to the area. ?If directed, apply heat to the back of your neck as often as told by your health care provider. Use the heat source that your health care provider recommends, such as a moist heat pack or a heating pad. ?Place a towel between your skin and the heat source. ?Leave the heat on for 20-30 minutes. ?Remove the heat if your skin turns bright red. This is especially important if you are unable to feel pain, heat, or cold. You have a greater risk of getting burned. ?Eating and drinking ?Eat meals on a regular schedule. ?If you drink alcohol: ?Limit how much you have to: ?0-1 drink a day for women who are not pregnant. ?0-2 drinks a day for men. ?Know how much alcohol is in your drink. In the [place] one drink equals one 12 oz bottle of beer (355 mL), one 5 oz glass of wine (148 mL), or one 1? oz glass of hard liquor (44 mL). ?Drink enough fluid to keep your urine pale yellow. ?Decrease your caffeine intake, or stop using caffeine. ?Lifestyle ?Get 7-9 hours of sleep each night, or get the amount of sleep recommended by your health care provider. ?At bedtime,   remove computers, phones, and tablets from your room. ?Find ways to manage your stress. This may include: ?Exercise. ?Deep breathing exercises. ?Yoga. ?Listening to music. ?Positive mental imagery. ?Try to sit up straight and avoid tensing your muscles. ?Do not use any products that contain nicotine or tobacco. These include cigarettes, chewing tobacco, and vaping devices, such as e-cigarettes. If you need help quitting, ask your health care provider. ?General instructions ? ?Avoid any headache triggers. Keep a journal to help  find out what may trigger your headaches. For example, write down: ?What you eat and drink. ?How much sleep you get. ?Any change to your diet or medicines. ?Keep all follow-up visits. This is important. ?Contact a health care provider if: ?Your headache does not get better. ?Your headache comes back. ?You are sensitive to sounds, light, or smells because of a headache. ?You have nausea or you vomit. ?Your stomach hurts. ?Get help right away if: ?You suddenly develop a severe headache, along with any of the following: ?A stiff neck. ?Nausea and vomiting. ?Confusion. ?Weakness in one part or one side of your body. ?Double vision or loss of vision. ?Shortness of breath. ?Rash. ?Unusual sleepiness. ?Fever or chills. ?Trouble speaking. ?Pain in your eye or ear. ?Trouble walking or balancing. ?Feeling faint or passing out. ?Summary ?A tension headache is a feeling of pain, pressure, or aching over the front and sides of the head. ?A tension headache can last from 30 minutes to several days. It is the most common kind of headache. ?This condition may be diagnosed based on your symptoms, your medical history, and a physical exam. ?This condition may be treated with lifestyle changes and with medicines that help relieve symptoms. ?This information is not intended to replace advice given to you by your health care provider. Make sure you discuss any questions you have with your health care provider. ?Document Revised: 12/13/2019 Document Reviewed: 12/13/2019 ?[employer]vier Patient Education ? 2023 [employer] ? ?

## 2022-06-23 ENCOUNTER — Ambulatory Visit (HOSPITAL_BASED_OUTPATIENT_CLINIC_OR_DEPARTMENT_OTHER)
Admission: RE | Admit: 2022-06-23 | Discharge: 2022-06-23 | Disposition: A | Discharge status: Home / Self Care | Payer: No Typology Code available for payment source | Source: Ambulatory Visit | Attending: Pulmonary Disease | Admitting: Pulmonary Disease

## 2022-06-23 DIAGNOSIS — Z720 Tobacco use: Secondary | ICD-10-CM | POA: Diagnosis not present

## 2022-07-08 NOTE — Progress Notes (Signed)
3 MONTH FOLLOW UP   Assessment and Plan:    Essential hypertension       Lifestyle controled; consider ARB Monitor blood pressure at home; call if consistently over 130/80 Continue DASH diet.   Reminder to go to the ER if any CP, SOB, nausea, dizziness, severe HA, changes vision/speech, left arm numbness and tingling and jaw pain. -     COMPLETE METABOLIC PANEL WITH GFR -     CBC with Differential/Platelet  History of hepatitis C Treated 2016 no longer follows with GI - CMP/GFR  Hyperlipidemia associated with T2DM (HCC) Discussed LDL goal <70 Pending results plan to stop lofibra and start rosuvastatin Discussed dietary and exercise modifications Low fat diet -     Lipid panel  Type 2 diabetes mellitus with hyperglycemia, without long-term current use of insulin (HCC) Continue medications: MetforminXL 500mg  two tablets BID with meals. Taking glipizide 5mg  half tablet is glucsose over 200 Mounjaro 5 mg/week  defer dose change for now Discussed general issues about diabetes pathophysiology and management. Education: Reviewed 'ABCs' of diabetes management (respective goals in parentheses):  A1C (<7), blood pressure (<130/80), and cholesterol (LDL <70) Recommended to start on daily bASA, plan to add statin Dietary recommendations Encouraged aerobic exercise.  Monitor blood glucose, discussed goal for patient -     Hemoglobin A1c  Sarcoidosis Advised to stop smoking CT 07/04/20- stable Follow up Pulmonology   Thrombocytopenia (HCC) Will continue to monitor -     CBC with Diff  Recurrent major depressive disorder, in partial remission (HCC) Off of wellbutrin and feels doing well; wants to work on lifestyle stress management techniques discussed, increase water, good sleep hygiene discussed, increase exercise, and increase veggies.   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 Vassie Loll ordered follow up and pending -  Wheezing throughout lungs- albuterol inhaler prescribed  : Type 2 diabetes mellitus with stage 2 chronic kidney disease, without long-term current use of insulin Continue medications:Mounjaro, Metformin, glipizide Continue diet and exercise.  Perform daily foot/skin check, notify office of any concerning changes.  Check A1C  -     Hemoglobin A1c  CKD stage 2 due to type 2 diabetes mellitus -     CBC with Differential/Platelet -     COMPLETE METABOLIC PANEL WITH GFR  Centrilobular emphysema Continue medications Strongly encouraged to cut down and quit smoking  Microalbuminuria due to type 2 diabetes mellitus Continue medications, diet and exercise  Medication management -     CBC with Differential/Platelet -     COMPLETE METABOLIC PANEL WITH GFR -     Lipid panel -     Hemoglobin A1c  Aortic valve calcification -     Ambulatory referral to Cardiology     Further disposition pending results if labs check today. Discussed med's effects and SE's.   Over 30 minutes of face to face interview, exam, counseling, chart review, and critical decision making was performed.     Future Appointments  Date Time Provider Department Center  04/05/2023 10:00 AM Raynelle DickWilkinson, Braelynn Benning E, NP [facility] None    HPI Patient presents for 3 month follow up for HTN, HLD, DMIII, obesity and vitamin D deficiency.   He is following with Dr. Deanne CofferAryal for sarcoidosis, knee pain, DDD of lumbar; meloxicam/voltaren didn't help, now on  naproxen and sulfasalazine, he reports irregular response, has follow up next month. Off Adalimumab and started on Hyrimoz 40 mg   He smokes (wellbutrin didn't help, chantix caused nightmares, currently trying gum); hx of 0.5 pack/day for 20 years.Continues on 10 cigarettes a day. Also emphysema/sarcoidosis, follows with Dr. Vassie LollAlva. Shortness of breath continues, states it is manageable. Currently using PRN albuterol only.   Chest CT 06/23/22 showed calcifications of the aortic valve.  He does have occasional difficulty swallowing large pills and food.   BMI is Body mass index is 30.33 kg/m., he has been working on diet and exercise. He has been limiting carbs. He has not been exercising Wt Readings from Last 3 Encounters:  07/12/22 223 lb 9.6 oz (101.4 kg)  06/23/22 221 lb (100.2 kg)  06/10/22 224 lb 9.6 oz (101.9 kg)    His blood pressure has been controlled at home, today their BP is BP: 106/62  BP Readings from Last 3 Encounters:  07/12/22 106/62  06/10/22 104/62  05/12/22 118/78  He does not workout. He denies chest pain, dizziness. Exertional dyspnea chronic/stable (sarcoid).  He is on cholesterol medication, fenofibrate for many years, never statin, and denies myalgias. His cholesterol is at goal of LDL <70.  The cholesterol last visit was:   Lab Results  Component Value Date   CHOL 82 04/02/2022   HDL 27 (L) 04/02/2022   LDLCALC 36 04/02/2022   TRIG 105 04/02/2022   CHOLHDL 3.0 04/02/2022    He has been working on diet and exercise for newly progressed T2DM, he is on metformin, mounjaro 5 mg/week, will take glipizide PRN glucose 200+, and denies paresthesia of the feet, polydipsia and polyuria.  He has glucometer, ran out of strips, needs refill. Last checks were running around 100-120.  Last A1C in the office was:  Lab Results  Component Value Date   HGBA1C 5.4 04/02/2022   Lab Results  Component Value Date   EGFR 95 04/02/2022   Lab Results  Component Value Date    MICRALBCREAT 33 (H) 04/02/2022   Patient is on Vitamin D supplement, taking 5000 IU daily Lab Results  Component Value Date   VD25OH 4741 04/02/2022     He has a history of hepatitis C, followed with Dr. Lucas MallowZamar, treated on 04/22/2014, Harvoni. Has had Hep B vaccine, no longer needs to follow up, we monitor here.     Current Medications:  Current Outpatient Medications on File Prior to Visit  Medication Sig Dispense Refill   Adalimumab (HUMIRA PEN [place]) Inject into the skin every 14 (fourteen) days.     albuterol (VENTOLIN HFA) 108 (90 Base) MCG/ACT inhaler INHALE 2 PUFFS INTO THE LUNGS EVERY 2 HOURS AS NEEDED FOR WHEEZING OR SHORTNESS OF BREATH (COUGH). 8.5 each 1   blood glucose meter kit and supplies KIT Dispense based on patient and insurance preference. Use up to four times daily as directed. 1 each 0   buPROPion (WELLBUTRIN XL) 150 MG 24 hr tablet Take 1 tablet  every Morning  for Mood, Focus & Concentration 90 tablet 3   butalbital-acetaminophen-caffeine (FIORICET) 50-325-40 MG tablet Take 1-2 tablets by mouth every 6 (six) hours as needed for headache. 20 tablet 0  famotidine (PEPCID) 40 MG tablet TAKE 1 TABLET BY MOUTH EVERY DAY IN THE EVENING 90 tablet 1   glipiZIDE (GLUCOTROL) 5 MG tablet Take 1/2 tab daily with breakfast if fasting glucose is 130+. Do not take if not eating or having low blood sugars. Take a second 1/2 tab with dinner if fasting remains persistently above 130. 90 tablet 1   losartan (COZAAR) 25 MG tablet Take 1 tab daily for blood pressure and diabetes kidney protection. 90 tablet 3   metFORMIN (GLUCOPHAGE-XR) 500 MG 24 hr tablet TAKE 2 TABLETS 2 X /DAY WITH MEALS FOR DIABETES 360 tablet 3   pantoprazole (PROTONIX) 40 MG tablet Take 1 tablet (40 mg total) by mouth daily as needed. 90 tablet 0   rosuvastatin (CRESTOR) 20 MG tablet Take 1 tab daily for cholesterol. 90 tablet 3   tirzepatide (MOUNJARO) 5 MG/0.5ML Pen INJECT 5 MG SUBCUTANEOUSLY WEEKLY 2 mL 2   VITAMIN D,  CHOLECALCIFEROL, PO Take 10,000 Units by mouth daily.     No current facility-administered medications on file prior to visit.    Allergies:  Allergies  Allergen Reactions   Chantix [Varenicline]     Nausea   Lopid [Gemfibrozil]     Nausea/weakness   Medical History:  Past Medical History:  Diagnosis Date   Colon polyps    Diabetes mellitus without complication (HCC)    Hepatitis C    treated for hepatatis but not sure what type   Hiatal hernia    Marijuana use    Osteoarthritis    Sarcoid    Thrombocytopenia (HCC)    Tobacco abuse    SURGICAL HISTORY He  has a past surgical history that includes Appendectomy; HEAD TRAUMA (1975); and left heart catheterization with coronary angiogram (N/A, 05/17/2012). FAMILY HISTORY His family history includes Cancer in his father; Diabetes Mellitus II in his mother; Leukemia in his mother; Other in an other family member; Sarcoidosis in his paternal aunt and sister. SOCIAL HISTORY He  reports that he has been smoking cigarettes. He has a 15.00 pack-year smoking history. He has never used smokeless tobacco. He reports current alcohol use of about 6.0 standard drinks of alcohol per week. He reports current drug use. Frequency: 2.00 times per week. Drug: Marijuana.  Review of Systems  Constitutional: Negative.  Negative for chills, diaphoresis, fever, malaise/fatigue and weight loss.  HENT:  Negative for congestion, ear discharge, ear pain, hearing loss, nosebleeds, sore throat and tinnitus.   Eyes: Negative.  Negative for blurred vision and double vision.  Respiratory:  Positive for shortness of breath (chronic exertional, stable). Negative for cough, wheezing and stridor.   Cardiovascular: Negative.  Negative for chest pain and leg swelling.  Gastrointestinal:  Positive for heartburn (controlled). Negative for abdominal pain, constipation, diarrhea, nausea and vomiting.  Genitourinary: Negative.  Negative for dysuria.  Musculoskeletal:   Positive for back pain and joint pain (knee pain). Negative for falls, myalgias and neck pain.  Skin: Negative.  Negative for rash.  Neurological: Negative.  Negative for dizziness, weakness and headaches.  Endo/Heme/Allergies:  Does not bruise/bleed easily.  Psychiatric/Behavioral: Negative.  Negative for depression. The patient does not have insomnia.      Physical Exam: Estimated body mass index is 30.33 kg/m as calculated from the following:   Height as of this encounter: 6' (1.829 m).   Weight as of this encounter: 223 lb 9.6 oz (101.4 kg). BP 106/62   Pulse 86   Temp (!) 97.5 F (36.4 C)  Ht 6' (1.829 m)   Wt 223 lb 9.6 oz (101.4 kg)   SpO2 96%   BMI 30.33 kg/m  General Appearance: Obese, in no apparent distress.  Eyes: PERRLA, EOMs, conjunctiva no swelling or erythema Neck: Supple, thyroid normal.  Respiratory: Respiratory effort normal, no wheezing, rhonchi, crackles.  Cardio: RRR without murmurs, rubs or gallops. Brisk peripheral pulses without edema.  Chest: symmetric, with normal excursions and percussion.  Abdomen: Soft, nontender, obese no guarding, rebound, hernias, masses, or organomegaly.  Lymphatics: Non tender without lymphadenopathy.  Musculoskeletal: Full ROM all peripheral extremities,5/5 strength, and antalgic gait.  Neurological Exam: normal  Vascular Exam: normal. Skin: Warm, dry without rashes, lesions, ecchymosis. Neuro: Cranial nerves intact, reflexes equal bilaterally. Normal muscle tone, no cerebellar symptoms. Psych: Awake and oriented X 3, normal affect, Insight and Judgment appropriate.   Raynelle Dick, NP 5:09 PM [facility]

## 2022-07-12 ENCOUNTER — Ambulatory Visit (INDEPENDENT_AMBULATORY_CARE_PROVIDER_SITE_OTHER): Payer: No Typology Code available for payment source | Admitting: Nurse Practitioner

## 2022-07-12 ENCOUNTER — Encounter: Payer: Self-pay | Admitting: Nurse Practitioner

## 2022-07-12 ENCOUNTER — Other Ambulatory Visit: Payer: Self-pay | Admitting: Nurse Practitioner

## 2022-07-12 VITALS — BP 106/62 | HR 86 | Temp 97.5°F | Ht 72.0 in | Wt 223.6 lb

## 2022-07-12 DIAGNOSIS — E1129 Type 2 diabetes mellitus with other diabetic kidney complication: Secondary | ICD-10-CM

## 2022-07-12 DIAGNOSIS — Z8619 Personal history of other infectious and parasitic diseases: Secondary | ICD-10-CM

## 2022-07-12 DIAGNOSIS — F3341 Major depressive disorder, recurrent, in partial remission: Secondary | ICD-10-CM

## 2022-07-12 DIAGNOSIS — I10 Essential (primary) hypertension: Secondary | ICD-10-CM

## 2022-07-12 DIAGNOSIS — E119 Type 2 diabetes mellitus without complications: Secondary | ICD-10-CM

## 2022-07-12 DIAGNOSIS — Z720 Tobacco use: Secondary | ICD-10-CM

## 2022-07-12 DIAGNOSIS — E1165 Type 2 diabetes mellitus with hyperglycemia: Secondary | ICD-10-CM | POA: Diagnosis not present

## 2022-07-12 DIAGNOSIS — Z79899 Other long term (current) drug therapy: Secondary | ICD-10-CM

## 2022-07-12 DIAGNOSIS — N182 Chronic kidney disease, stage 2 (mild): Secondary | ICD-10-CM

## 2022-07-12 DIAGNOSIS — E1169 Type 2 diabetes mellitus with other specified complication: Secondary | ICD-10-CM | POA: Diagnosis not present

## 2022-07-12 DIAGNOSIS — E1122 Type 2 diabetes mellitus with diabetic chronic kidney disease: Secondary | ICD-10-CM | POA: Diagnosis not present

## 2022-07-12 DIAGNOSIS — E669 Obesity, unspecified: Secondary | ICD-10-CM

## 2022-07-12 DIAGNOSIS — E785 Hyperlipidemia, unspecified: Secondary | ICD-10-CM

## 2022-07-12 DIAGNOSIS — J432 Centrilobular emphysema: Secondary | ICD-10-CM

## 2022-07-12 DIAGNOSIS — D869 Sarcoidosis, unspecified: Secondary | ICD-10-CM

## 2022-07-12 DIAGNOSIS — J439 Emphysema, unspecified: Secondary | ICD-10-CM

## 2022-07-12 DIAGNOSIS — D696 Thrombocytopenia, unspecified: Secondary | ICD-10-CM

## 2022-07-12 DIAGNOSIS — R809 Proteinuria, unspecified: Secondary | ICD-10-CM

## 2022-07-12 DIAGNOSIS — I359 Nonrheumatic aortic valve disorder, unspecified: Secondary | ICD-10-CM

## 2022-07-12 NOTE — Patient Instructions (Addendum)
CT showed aortic valve had calcifications- referring to cardiology  Aortic Valve Stenosis  Aortic valve stenosis is a narrowing of the aortic valve in the heart. The aortic valve opens and closes to regulate blood flow between the left side of the heart (left ventricle) and the artery that leads away from the heart (aorta). When the aortic valve becomes narrow, it is difficult for the heart to pump blood out to the body, which causes the heart to work harder. The extra work can weaken the heart muscle over time. Aortic valve stenosis can range from mild to severe. If it is not treated, it can become more severe over time and lead to heart failure. What are the causes? This condition may be caused by: Buildup of calcium around and on the aortic valve. This can occur with aging. This is the most common cause of aortic valve stenosis. A heart problem that developed in the womb (birth defect). Rheumatic fever. Radiation to the chest. What increases the risk? You are more likely to develop this condition if: You are 65 years and older. You were born with an abnormal bicuspid valve. What are the signs or symptoms? You may not have any symptoms until your condition becomes severe. It may take 10-20 years for mild or moderate aortic valve stenosis to become severe. Symptoms may include: Shortness of breath. This may get worse during physical activity. Feeling unusually weak and tired (fatigue). Extreme discomfort in the chest, neck, or arm during physical activity (angina). A heartbeat that is irregular or faster than normal (palpitations). Dizziness or fainting. This may happen when you get tired or after you take certain heart medicines, such as nitroglycerin. How is this diagnosed? This condition may be diagnosed with: A physical exam. Echocardiogram. This is a type of imaging test that uses sound waves (ultrasound) to make images of your heart. There are two kinds of this test that may be  used. Transthoracic echocardiogram (TTE). For this type, a wand-like tool (transducer) is moved over your chest to create ultrasound images that are recorded by a computer. Transesophageal echocardiogram (TEE). For this type, a flexible tube (probe) is inserted down the part of the body that moves food from your mouth to your stomach (esophagus). The heart and the esophagus are close to each other. Your health care provider will use the probe to take clear, detailed pictures of the heart. Cardiac catheterization. For this procedure, a small, thin tube (catheter) is passed through a large vein in your neck, groin, or arm. The catheter is used to get information about arteries, structures, blood pressure, and oxygen levels in your heart. Exercise stress tests. These are tests that check the blood supply to your heart and your heart's response to exercise. These tests help determine the severity of your condition. Cardiac magnetic resonance imaging (MRI). This test uses magnetic fields and radio waves to create detailed images of your heart. It is used to look at the size of your aorta. Computed tomography, or CT scan. This is a test that uses a series of X-rays and a computer to produce a 3D image of the heart. This test may be used to measure the size of your aorta and look at your aortic valve more closely. You may work with a health care provider who specializes in the heart (cardiologist) for diagnosis and treatment. How is this treated? Treatment depends on how severe your condition is and what your symptoms are. You will need to have your heart checked  regularly to make sure that your condition is not getting worse or causing serious problems. Treatment may include: Surgery to replace your aortic valve. This is the most common treatment for aortic valve stenosis, and it is the only treatment to cure the condition. Several types of surgeries are available. The surgery may be done: Through a large  incision over your heart (open-heart surgery). Through small incisions, using a flexible tube called a catheter (transcatheter aortic valve replacement, TAVR). Medicines that help to keep your heart rate regular. Medicines that thin your blood (anticoagulants) to prevent blood clots. Antibiotic medicines to help prevent infection. If your condition is mild, you may only need regular follow-up visits for monitoring. Follow these instructions at home: Lifestyle If you drink alcohol: Limit how much you have to: 0-1 drink a day for women. 0-2 drinks a day for men. Know how much alcohol is in your drink. In the [place] one drink equals one 12 oz bottle of beer (355 mL), one 5 oz glass of wine (148 mL), or one 1 oz glass of hard liquor (44 mL). Do not use any products that contain nicotine or tobacco. These products include cigarettes, chewing tobacco, and vaping devices, such as e-cigarettes. If you need help quitting, ask your health care provider. Work with your health care provider to manage your blood pressure and cholesterol. Maintain a healthy weight. Eating and drinking Follow instructions from your health care provider about eating or drinking restrictions. You may be told to: Eat a heart-healthy diet that includes plenty of fresh fruits and vegetables, whole grains, lean protein, and low-fat or nonfat dairy. Limit how much caffeine you drink. Caffeine can affect your heart's rate and rhythm. Avoid certain foods, including: Foods that are high in salt (sodium), saturated fat, or sugar. Canned or highly processed food. Fried foods.  Activity Exercise regularly. If your aortic valve stenosis is mild, you may only need to avoid very intense physical activity, such as heavy weight lifting. The more severe your aortic valve stenosis is, the more activities you may need to avoid. Return to your normal activities as told by your health care provider. Ask your health care provider what amount  and type of physical activity is safe for you. If you are taking blood thinners: Talk with your health care provider before you take any medicines that contain aspirin or NSAIDs, such as ibuprofen. These medicines increase your risk for dangerous bleeding. Take your medicine exactly as told, at the same time every day. Avoid activities that could cause injury or bruising. Follow instructions about how to prevent falls. Wear a medical alert bracelet or carry a card that lists what medicines you take. General instructions Take over-the-counter and prescription medicines only as told by your health care provider. If you were prescribed an antibiotic, take it as told by your health care provider. Do not stop taking the antibiotic even if you start to feel better. If you are a woman and you plan to become pregnant, talk with your health care provider before you become pregnant. Before you have any type of medical or dental procedure or surgery, tell all health care providers that you have aortic valve stenosis. This may affect treatment that you receive. Keep all follow-up visits. This is important. Contact a health care provider if: You have a fever. Get help right away if: You develop any of the following symptoms: Chest pain. Chest tightness. Shortness of breath. You feel light-headed. You feel like you might faint. Your heartbeat  is irregular or faster than normal. These symptoms may be an emergency. Get help right away. Call 911. Do not wait to see if the symptoms will go away. Do not drive yourself to the hospital. Summary Aortic valve stenosis is a narrowing of the aortic valve in the heart. The aortic valve regulates blood flow between the left ventricle and the aorta. If it is not treated, aortic valve stenosis can lead to heart failure. Treatment depends on how severe your condition is and what your symptoms are. You will need to have your heart checked regularly to make sure that  your condition is not getting worse or causing serious problems. Exercise regularly. Ask your health care provider what amount and type of physical activity is safe for you. This information is not intended to replace advice given to you by your health care provider. Make sure you discuss any questions you have with your health care provider. Document Revised: 12/14/2020 Document Reviewed: 12/14/2020 Elsevier Patient Education  2023 [employer].

## 2022-07-13 LAB — COMPLETE METABOLIC PANEL WITH GFR
AG Ratio: 1.9 (calc) (ref 1.0–2.5)
ALT: 31 U/L (ref 9–46)
AST: 21 U/L (ref 10–35)
Albumin: 4.3 g/dL (ref 3.6–5.1)
Alkaline phosphatase (APISO): 84 U/L (ref 35–144)
BUN: 12 mg/dL (ref 7–25)
CO2: 25 mmol/L (ref 20–32)
Calcium: 9.2 mg/dL (ref 8.6–10.3)
Chloride: 109 mmol/L (ref 98–110)
Creat: 0.89 mg/dL (ref 0.70–1.35)
Globulin: 2.3 g/dL (calc) (ref 1.9–3.7)
Glucose, Bld: 102 mg/dL — ABNORMAL HIGH (ref 65–99)
Potassium: 4.5 mmol/L (ref 3.5–5.3)
Sodium: 144 mmol/L (ref 135–146)
Total Bilirubin: 0.5 mg/dL (ref 0.2–1.2)
Total Protein: 6.6 g/dL (ref 6.1–8.1)
eGFR: 96 mL/min/{1.73_m2} (ref >=60)

## 2022-07-13 LAB — CBC WITH DIFFERENTIAL/PLATELET
Absolute Monocytes: 480 cells/uL (ref 200–950)
Basophils Absolute: 48 cells/uL (ref 0–200)
Basophils Relative: 0.8 %
Eosinophils Absolute: 312 cells/uL (ref 15–500)
Eosinophils Relative: 5.2 %
HCT: 47.5 % (ref 38.5–50.0)
Hemoglobin: 15.1 g/dL (ref 13.2–17.1)
Lymphs Abs: 1698 cells/uL (ref 850–3900)
MCH: 27.2 pg (ref 27.0–33.0)
MCHC: 31.8 g/dL — ABNORMAL LOW (ref 32.0–36.0)
MCV: 85.4 fL (ref 80.0–100.0)
MPV: 10.6 fL (ref 7.5–12.5)
Monocytes Relative: 8 %
Neutro Abs: 3462 cells/uL (ref 1500–7800)
Neutrophils Relative %: 57.7 %
Platelets: 186 10*3/uL (ref 140–400)
RBC: 5.56 10*6/uL (ref 4.20–5.80)
RDW: 12.8 % (ref 11.0–15.0)
Total Lymphocyte: 28.3 %
WBC: 6 10*3/uL (ref 3.8–10.8)

## 2022-07-13 LAB — LIPID PANEL
Cholesterol: 92 mg/dL (ref <200)
HDL: 30 mg/dL — ABNORMAL LOW (ref >=40)
LDL Cholesterol (Calc): 35 mg/dL (calc)
Non-HDL Cholesterol (Calc): 62 mg/dL (calc) (ref <130)
Total CHOL/HDL Ratio: 3.1 (calc) (ref <5.0)
Triglycerides: 199 mg/dL — ABNORMAL HIGH (ref <150)

## 2022-07-13 LAB — HEMOGLOBIN A1C
Hgb A1c MFr Bld: 5.2 % of total Hgb (ref <5.7)
Mean Plasma Glucose: 103 mg/dL
eAG (mmol/L): 5.7 mmol/L

## 2022-07-21 ENCOUNTER — Other Ambulatory Visit: Payer: Self-pay | Admitting: Nurse Practitioner

## 2022-07-21 DIAGNOSIS — E1165 Type 2 diabetes mellitus with hyperglycemia: Secondary | ICD-10-CM

## 2022-07-21 NOTE — Telephone Encounter (Signed)
Please advise pt it is on backorder and should try to call other pharmacies to find availability

## 2022-07-22 ENCOUNTER — Other Ambulatory Visit: Payer: Self-pay | Admitting: Nurse Practitioner

## 2022-07-22 DIAGNOSIS — E1165 Type 2 diabetes mellitus with hyperglycemia: Secondary | ICD-10-CM

## 2022-08-06 ENCOUNTER — Encounter: Payer: Self-pay | Admitting: Cardiovascular Disease

## 2022-08-06 ENCOUNTER — Ambulatory Visit
Payer: No Typology Code available for payment source | Attending: Cardiovascular Disease | Admitting: Cardiovascular Disease

## 2022-08-06 VITALS — BP 144/70 | HR 79 | Ht 72.0 in | Wt 223.4 lb

## 2022-08-06 DIAGNOSIS — E785 Hyperlipidemia, unspecified: Secondary | ICD-10-CM

## 2022-08-06 DIAGNOSIS — E1169 Type 2 diabetes mellitus with other specified complication: Secondary | ICD-10-CM

## 2022-08-06 DIAGNOSIS — I10 Essential (primary) hypertension: Secondary | ICD-10-CM

## 2022-08-06 DIAGNOSIS — Z7984 Long term (current) use of oral hypoglycemic drugs: Secondary | ICD-10-CM

## 2022-08-06 DIAGNOSIS — I2510 Atherosclerotic heart disease of native coronary artery without angina pectoris: Secondary | ICD-10-CM | POA: Diagnosis not present

## 2022-08-06 DIAGNOSIS — Z720 Tobacco use: Secondary | ICD-10-CM

## 2022-08-06 NOTE — Patient Instructions (Signed)
Medication Instructions:  Your physician recommends that you continue on your current medications as directed. Please refer to the Current Medication list given to you today.  *If you need a refill on your cardiac medications before your next appointment, please call your pharmacy*   Testing/Procedures: Your physician has requested that you have an echocardiogram. Echocardiography is a painless test that uses sound waves to create images of your heart. It provides your doctor with information about the size and shape of your heart and how well your heart's chambers and valves are working. This procedure takes approximately one hour. There are no restrictions for this procedure. Please do NOT wear cologne, perfume, aftershave, or lotions (deodorant is allowed). Please arrive 15 minutes prior to your appointment time. This will take place at [address] [place]   Dr. Allyson Sabal has ordered a CT coronary calcium score.   Test locations:  [facility]  [place]  [facility]  This is $99 out of pocket.   Coronary CalciumScan A coronary calcium scan is an imaging test used to look for deposits of calcium and other fatty materials (plaques) in the inner lining of the blood vessels of the heart (coronary arteries). These deposits of calcium and plaques can partly clog and narrow the coronary arteries without producing any symptoms or warning signs. This puts a person at risk for a heart attack. This test can detect these deposits before symptoms develop. Tell a health care provider about: Any allergies you have. All medicines you are taking, including vitamins, herbs, eye drops, creams, and over-the-counter medicines. Any problems you or family members have had with anesthetic medicines. Any blood disorders you have. Any surgeries you have had. Any medical conditions you have. Whether you are pregnant or may be  pregnant. What are the risks? Generally, this is a safe procedure. However, problems may occur, including: Harm to a pregnant woman and her unborn baby. This test involves the use of radiation. Radiation exposure can be dangerous to a pregnant woman and her unborn baby. If you are pregnant, you generally should not have this procedure done. Slight increase in the risk of cancer. This is because of the radiation involved in the test. What happens before the procedure? No preparation is needed for this procedure. What happens during the procedure? You will undress and remove any jewelry around your neck or chest. You will put on a hospital gown. Sticky electrodes will be placed on your chest. The electrodes will be connected to an electrocardiogram (ECG) machine to record a tracing of the electrical activity of your heart. A CT scanner will take pictures of your heart. During this time, you will be asked to lie still and hold your breath for 2-3 seconds while a picture of your heart is being taken. The procedure may vary among health care providers and hospitals. What happens after the procedure? You can get dressed. You can return to your normal activities. It is up to you to get the results of your test. Ask your health care provider, or the department that is doing the test, when your results will be ready. Summary A coronary calcium scan is an imaging test used to look for deposits of calcium and other fatty materials (plaques) in the inner lining of the blood vessels of the heart (coronary arteries). Generally, this is a safe procedure. Tell your health care provider if you are pregnant or may be pregnant. No  preparation is needed for this procedure. A CT scanner will take pictures of your heart. You can return to your normal activities after the scan is done. This information is not intended to replace advice given to you by your health care provider. Make sure you discuss any questions  you have with your health care provider. Document Released: 09/11/2007 Document Revised: 02/02/2016 Document Reviewed: 02/02/2016 Elsevier Interactive Patient Education  2017 [employer].    Follow-Up: At [facility], you and your health needs are our priority.  As part of our continuing mission to provide you with exceptional heart care, we have created designated Provider Care Teams.  These Care Teams include your primary Cardiologist (physician) and Advanced Practice Providers (APPs -  Physician Assistants and Nurse Practitioners) who all work together to provide you with the care you need, when you need it.  We recommend signing up for the patient portal called "MyChart".  Sign up information is provided on this After Visit Summary.  MyChart is used to connect with patients for Virtual Visits (Telemedicine).  Patients are able to view lab/test results, encounter notes, upcoming appointments, etc.  Non-urgent messages can be sent to your provider as well.   To learn more about what you can do with MyChart, go to [email or web address].    Your next appointment:   12 month(s)  Provider:   Nanetta Batty, MD

## 2022-08-06 NOTE — Assessment & Plan Note (Signed)
History of hyperlipidemia on statin therapy with lipid profile performed 07/12/2022 revealing total cholesterol of 92, LDL 35 and HDL of 30.

## 2022-08-06 NOTE — Assessment & Plan Note (Signed)
History of ongoing tobacco abuse of 1/2 pack/day.  He smoked for the last 50 years.  We talked about the importance of smoking cessation.

## 2022-08-06 NOTE — Progress Notes (Signed)
08/06/2022 Marvene Staff   December 31, 1955  [id number]  Primary Physician Lucky Cowboy, MD Primary Cardiologist: Runell Gess MD Nicholes Calamity, [place]  HPI:  Jerry Stewart is a 67 y.o. 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  He was referred by his PCP, Dr. Oneta Rack , for evaluation.  His cardiovascular risk factor profile is notable for 25-pack-year smoking 1/2 pack/day for last 50 years recalcitrant to risk factor modification.  He has treated hypertension, diabetes and hyperlipidemia.  There is no family history of heart disease.  Is never had a heart attack or stroke.  He denies chest pain or shortness of breath.  He did have a cardiac catheterization performed by Dr. [place] 05/17/2012 for chest pain which was entirely normal.  He had a chest CT done for lung cancer screening 06/23/2022 that showed coronary calcification and aortic valve calcification.   Current Meds  Medication Sig   ACCU-CHEK GUIDE test strip CHECK BLOOD SUGAR 1 TIME DAILY-DX-E11.9   blood glucose meter kit and supplies KIT Dispense based on patient and insurance preference. Use up to four times daily as directed.   buPROPion (WELLBUTRIN XL) 150 MG 24 hr tablet Take 1 tablet  every Morning  for Mood, Focus & Concentration   butalbital-acetaminophen-caffeine (FIORICET) 50-325-40 MG tablet Take 1-2 tablets by mouth every 6 (six) hours as needed for headache.   famotidine (PEPCID) 40 MG tablet TAKE 1 TABLET BY MOUTH EVERY DAY IN THE EVENING   glipiZIDE (GLUCOTROL) 5 MG tablet Take 1/2 tab daily with breakfast if fasting glucose is 130+. Do not take if not eating or having low blood sugars. Take a second 1/2 tab with dinner if fasting remains persistently above 130.   HYRIMOZ 40 MG/0.8ML SOAJ Inject 1 pen under the skin every 14 days for 28 days   losartan (COZAAR)  25 MG tablet Take 1 tab daily for blood pressure and diabetes kidney protection.   metFORMIN (GLUCOPHAGE-XR) 500 MG 24 hr tablet TAKE 2 TABLETS 2 X /DAY WITH MEALS FOR DIABETES   naproxen (NAPROSYN) 500 MG tablet SMARTSIG:1 Tablet(s) By Mouth Every 12 Hours PRN   rosuvastatin (CRESTOR) 20 MG tablet Take 1 tab daily for cholesterol.   tirzepatide (MOUNJARO) 5 MG/0.5ML Pen INJECT 5 MG SUBCUTANEOUSLY WEEKLY   VITAMIN D, CHOLECALCIFEROL, PO Take 10,000 Units by mouth daily.     Allergies  Allergen Reactions   Chantix [Varenicline]     Nausea   Lopid [Gemfibrozil]     Nausea/weakness    Social History   Socioeconomic History   Marital status: Married    Spouse name: Armando Reichert   Number of children: 2   Years of education: 11th   Highest education level: Not on file  Occupational History   Occupation: laborer    Employer: [employer]    Comment: Warehouse  Tobacco Use   Smoking status: Every Day    Packs/day: 0.50    Years: 30.00    Additional pack years: 0.00    Total pack years: 15.00    Types: Cigarettes   Smokeless tobacco: Never   Tobacco comments:    " i AM WORKING ON QUITTING MYSELF  Vaping Use   Vaping Use: Never used  Substance and Sexual Activity   Alcohol use: Yes    Alcohol/week: 6.0 standard drinks of alcohol    Types: 6 Cans  of beer per week    Comment: several times a month   Drug use: Yes    Frequency: 2.0 times per week    Types: Marijuana    Comment: Marijuana: 2-3 joints weekly   Sexual activity: Yes  Other Topics Concern   Not on file  Social History Narrative   Patient lives at home with spouse.   Caffeine Use: 3-4 cups weekly   Social Determinants of Health   Financial Resource Strain: Not on file  Food Insecurity: Not on file  Transportation Needs: Not on file  Physical Activity: Not on file  Stress: Not on file  Social Connections: Not on file  Intimate Partner Violence: Not on file     Review of Systems: General: negative for chills, fever,  night sweats or weight changes.  Cardiovascular: negative for chest pain, dyspnea on exertion, edema, orthopnea, palpitations, paroxysmal nocturnal dyspnea or shortness of breath Dermatological: negative for rash Respiratory: negative for cough or wheezing Urologic: negative for hematuria Abdominal: negative for nausea, vomiting, diarrhea, bright red blood per rectum, melena, or hematemesis Neurologic: negative for visual changes, syncope, or dizziness All other systems reviewed and are otherwise negative except as noted above.    Blood pressure (!) 144/70, pulse 79, height 6' (1.829 m), weight 223 lb 6.4 oz (101.3 kg), SpO2 94 %.  General appearance: alert and no distress Neck: no adenopathy, no carotid bruit, no JVD, supple, symmetrical, trachea midline, and thyroid not enlarged, symmetric, no tenderness/mass/nodules Lungs: clear to auscultation bilaterally Heart: regular rate and rhythm, S1, S2 normal, no murmur, click, rub or gallop Extremities: extremities normal, atraumatic, no cyanosis or edema Pulses: 2+ and symmetric Skin: Skin color, texture, turgor normal. No rashes or lesions Neurologic: Grossly normal  EKG sinus rhythm at 79 without ST or T wave changes.  I personally reviewed this EKG.  ASSESSMENT AND PLAN:   Hyperlipidemia due to type 2 diabetes mellitus (HCC) History of hyperlipidemia on statin therapy with lipid profile performed 07/12/2022 revealing total cholesterol of 92, LDL 35 and HDL of 30.  Tobacco abuse History of ongoing tobacco abuse of 1/2 pack/day.  He smoked for the last 50 years.  We talked about the importance of smoking cessation.  Essential hypertension History of essential hypertension blood pressure measured today at 144/70.  He is on losartan.  Coronary artery calcification seen on CT scan Coronary calcification seen on chest CT done for lung cancer screening 06/23/2022.  There is also calcification of the aortic valve.  I am going to get a  coronary calcium score and a 2D echocardiogram.     Runell Gess MD [facility] 08/06/2022 10:29 AM

## 2022-08-06 NOTE — Assessment & Plan Note (Signed)
History of essential hypertension blood pressure measured today at 144/70.  He is on losartan.

## 2022-08-06 NOTE — Assessment & Plan Note (Signed)
Coronary calcification seen on chest CT done for lung cancer screening 06/23/2022.  There is also calcification of the aortic valve.  I am going to get a coronary calcium score and a 2D echocardiogram.

## 2022-08-10 ENCOUNTER — Encounter: Payer: Self-pay | Admitting: Nurse Practitioner

## 2022-08-10 ENCOUNTER — Ambulatory Visit (INDEPENDENT_AMBULATORY_CARE_PROVIDER_SITE_OTHER): Payer: No Typology Code available for payment source | Admitting: Nurse Practitioner

## 2022-08-10 VITALS — BP 112/70 | HR 72 | Temp 97.6°F | Ht 72.0 in | Wt 221.2 lb

## 2022-08-10 DIAGNOSIS — H00011 Hordeolum externum right upper eyelid: Secondary | ICD-10-CM | POA: Diagnosis not present

## 2022-08-10 MED ORDER — ERYTHROMYCIN 5 MG/GM OP OINT: TOPICAL_OINTMENT | OPHTHALMIC | 0 refills | Status: DC

## 2022-08-10 NOTE — Patient Instructions (Signed)
Stye ?A stye, also known as a hordeolum, is a bump that forms on an eyelid. It may look like a pimple next to the eyelash. A stye can form inside the eyelid (internal stye) or outside the eyelid (external stye). A stye can cause redness, swelling, and pain on the eyelid. ?Styes are very common. Anyone can get them at any age. They usually occur in just one eye at a time, but you may have more than one in either eye. ?What are the causes? ?A stye is caused by an infection. The infection is almost always caused by bacteria called Staphylococcus aureus. This is a common type of bacteria that lives on the skin. ?An internal stye may result from an infected oil-producing gland inside the eyelid. An external stye may be caused by an infection at the base of the eyelash (hair follicle). ?What increases the risk? ?You are more likely to develop a stye if: ?You have had a stye before. ?You have any of these conditions: ?Red, itchy, inflamed eyelids (blepharitis). ?A skin condition such as seborrheic dermatitis or rosacea. ?High fat levels in your blood (lipids). ?Dry eyes. ?What are the signs or symptoms? ?The most common symptom of a stye is eyelid pain. Internal styes are more painful than external styes. Other symptoms may include: ?Painful swelling of your eyelid. ?A scratchy feeling in your eye. ?Tearing and redness of your eye. ?A pimple-like bump on the edge of the eyelid. ?Pus draining from the stye. ?How is this diagnosed? ?Your health care provider may be able to diagnose a stye just by examining your eye. The health care provider may also check to make sure: ?You do not have a fever or other signs of a more serious infection. ?The infection has not spread to other parts of your eye or areas around your eye. ?How is this treated? ?Most styes will clear up in a few days without treatment or with warm compresses applied to the area. You may need to use antibiotic drops or ointment to treat an infection. Sometimes,  steroid drops or ointment are used in addition to antibiotics. ?In some cases, your health care provider may give you a small steroid injection in the eyelid. ?If your stye does not heal with routine treatment, your health care provider may drain pus from the stye using a thin blade or needle. This may be done if the stye is large, causing a lot of pain, or affecting your vision. ?Follow these instructions at home: ?Take over-the-counter and prescription medicines only as told by your health care provider. This includes eye drops or ointments. ?If you were prescribed an antibiotic medicine, steroid medicine, or both, apply or use them as told by your health care provider. Do not stop using the medicine even if your condition improves. ?Apply a warm, wet cloth (warm compress) to your eye for 5-10 minutes, 4 to 6 times a day. ?Clean the affected eyelid as directed by your health care provider. ?Do not wear contact lenses or eye makeup until your stye has healed and your health care provider says that it is safe. ?Do not try to pop or drain the stye. ?Do not rub your eye. ?Contact a health care provider if: ?You have chills or a fever. ?Your stye does not go away after several days. ?Your stye affects your vision. ?Your eyeball becomes swollen, red, or painful. ?Get help right away if: ?You have pain when moving your eye around. ?Summary ?A stye is a bump that forms   on an eyelid. It may look like a pimple next to the eyelash. ?A stye can form inside the eyelid (internal stye) or outside the eyelid (external stye). A stye can cause redness, swelling, and pain on the eyelid. ?Your health care provider may be able to diagnose a stye just by examining your eye. ?Apply a warm, wet cloth (warm compress) to your eye for 5-10 minutes, 4 to 6 times a day. ?This information is not intended to replace advice given to you by your health care provider. Make sure you discuss any questions you have with your health care  provider. ?Document Revised: 05/21/2020 Document Reviewed: 05/21/2020 ?Elsevier Patient Education ? 2023 Elsevier Inc. ? ?

## 2022-08-10 NOTE — Progress Notes (Signed)
Assessment and Plan:  Jerry Stewart was seen today for an episodic visit.  Diagnoses and all order for this visit:  Hordeolum externum of right upper eyelid Continue to apply warm compress to area several times throughout the day until area drains. Contact office if s/s fail to improve. Wear eye protection when doing yard work.   Wash eyes with mild gentle cleanser daily.    - erythromycin ophthalmic ointment; Apply 1 cm ribbon in right eye TID x 7 days.  Dispense: 1 g; Refill: 0  Notify office for further evaluation and treatment, questions or concerns if s/s fail to improve. The risks and benefits of my recommendations, as well as other treatment options were discussed with the patient today. Questions were answered.  Further disposition pending results of labs. Discussed med's effects and SE's.    Over 15 minutes of exam, counseling, chart review, and critical decision making was performed.   Future Appointments  Date Time Provider Department Center  09/08/2022 11:30 AM [facility] 1 [facility]  09/09/2022  9:30 AM [facility] 4 [id number]  10/15/2022  9:30 AM Raynelle Dick, NP [facility] None  04/05/2023 10:00 AM Raynelle Dick, NP [facility] None    ------------------------------------------------------------------------------------------------------------------   HPI BP 112/70   Pulse 72   Temp 97.6 F (36.4 C)   Ht 6' (1.829 m)   Wt 221 lb 3.2 oz (100.3 kg)   SpO2 98%   BMI 30.00 kg/m   67 y.o.male presents for evaluation of right upper eye lid stye.  Onset x 1 week ago while doing yard work.  He was not wearing eye protection.   He has been using "left over" eye gtts provided since 03/2022 OV when he had a style in the left eye.  These have not helped to reduce the redness and swelling.  Now has a pustule.  He has taken a hot shower but has not applied a warm compress.  Denies vision changes, fever.  Past Medical History:  Diagnosis Date   Colon  polyps    Diabetes mellitus without complication (HCC)    Hepatitis C    treated for hepatatis but not sure what type   Hiatal hernia    Marijuana use    Osteoarthritis    Sarcoid    Thrombocytopenia (HCC)    Tobacco abuse      Allergies  Allergen Reactions   Chantix [Varenicline]     Nausea   Lopid [Gemfibrozil]     Nausea/weakness    Current Outpatient Medications on File Prior to Visit  Medication Sig   ACCU-CHEK GUIDE test strip CHECK BLOOD SUGAR 1 TIME DAILY-DX-E11.9   albuterol (VENTOLIN HFA) 108 (90 Base) MCG/ACT inhaler INHALE 2 PUFFS INTO THE LUNGS EVERY 2 HOURS AS NEEDED FOR WHEEZING OR SHORTNESS OF BREATH (COUGH).   blood glucose meter kit and supplies KIT Dispense based on patient and insurance preference. Use up to four times daily as directed.   buPROPion (WELLBUTRIN XL) 150 MG 24 hr tablet Take 1 tablet  every Morning  for Mood, Focus & Concentration   butalbital-acetaminophen-caffeine (FIORICET) 50-325-40 MG tablet Take 1-2 tablets by mouth every 6 (six) hours as needed for headache.   famotidine (PEPCID) 40 MG tablet TAKE 1 TABLET BY MOUTH EVERY DAY IN THE EVENING   glipiZIDE (GLUCOTROL) 5 MG tablet Take 1/2 tab daily with breakfast if fasting glucose is 130+. Do not take if not eating or having low blood sugars. Take a second 1/2 tab  with dinner if fasting remains persistently above 130.   HYRIMOZ 40 MG/0.8ML SOAJ Inject 1 pen under the skin every 14 days for 28 days   losartan (COZAAR) 25 MG tablet Take 1 tab daily for blood pressure and diabetes kidney protection.   metFORMIN (GLUCOPHAGE-XR) 500 MG 24 hr tablet TAKE 2 TABLETS 2 X /DAY WITH MEALS FOR DIABETES   naproxen (NAPROSYN) 500 MG tablet SMARTSIG:1 Tablet(s) By Mouth Every 12 Hours PRN   rosuvastatin (CRESTOR) 20 MG tablet Take 1 tab daily for cholesterol.   tirzepatide (MOUNJARO) 5 MG/0.5ML Pen INJECT 5 MG SUBCUTANEOUSLY WEEKLY   VITAMIN D, CHOLECALCIFEROL, PO Take 10,000 Units by mouth daily.    pantoprazole (PROTONIX) 40 MG tablet Take 1 tablet (40 mg total) by mouth daily as needed. (Patient not taking: Reported on 08/06/2022)   No current facility-administered medications on file prior to visit.    ROS: all negative except what is noted in the HPI.   Physical Exam:  BP 112/70   Pulse 72   Temp 97.6 F (36.4 C)   Ht 6' (1.829 m)   Wt 221 lb 3.2 oz (100.3 kg)   SpO2 98%   BMI 30.00 kg/m   General Appearance: NAD.  Awake, conversant and cooperative. Eyes: Right upper eye lid near medial canthus with approximately 4 mm raised round pustule, erythematous, tender to palpation. PERRLA, EOMs intact.  Sclera white.  Conjunctiva without erythema. Sinuses: No frontal/maxillary tenderness.  No nasal discharge. Nares patent.  ENT/Mouth: Ext aud canals clear.  Bilateral TMs w/DOL and without erythema or bulging. Hearing intact.  Posterior pharynx without swelling or exudate.  Tonsils without swelling or erythema.  Neck: Supple.  No masses, nodules or thyromegaly. Respiratory: Effort is regular with non-labored breathing. Breath sounds are equal bilaterally without rales, rhonchi, wheezing or stridor.  Cardio: RRR with no MRGs. Brisk peripheral pulses without edema.  Abdomen: Active BS in all four quadrants.  Soft and non-tender without guarding, rebound tenderness, hernias or masses. Lymphatics: Non tender without lymphadenopathy.  Musculoskeletal: Full ROM, 5/5 strength, normal ambulation.  No clubbing or cyanosis. Skin: Appropriate color for ethnicity. Warm without rashes, lesions, ecchymosis, ulcers.  Neuro: CN II-XII grossly normal. Normal muscle tone without cerebellar symptoms and intact sensation.   Psych: AO X 3,  appropriate mood and affect, insight and judgment.     Adela Glimpse, NP 10:02 AM [facility]

## 2022-09-06 ENCOUNTER — Telehealth: Payer: Self-pay

## 2022-09-06 NOTE — Telephone Encounter (Signed)
Prior auth for [employer] 5mg  completed and submitted and approved.

## 2022-09-06 NOTE — Telephone Encounter (Signed)
Prior auth approved

## 2022-09-08 ENCOUNTER — Ambulatory Visit (HOSPITAL_BASED_OUTPATIENT_CLINIC_OR_DEPARTMENT_OTHER)
Admission: RE | Admit: 2022-09-08 | Discharge: 2022-09-08 | Disposition: A | Discharge status: Home / Self Care | Payer: No Typology Code available for payment source | Source: Ambulatory Visit | Attending: Cardiovascular Disease | Admitting: Cardiovascular Disease

## 2022-09-08 DIAGNOSIS — I2510 Atherosclerotic heart disease of native coronary artery without angina pectoris: Secondary | ICD-10-CM | POA: Insufficient documentation

## 2022-09-08 DIAGNOSIS — E1169 Type 2 diabetes mellitus with other specified complication: Secondary | ICD-10-CM | POA: Insufficient documentation

## 2022-09-08 DIAGNOSIS — Z720 Tobacco use: Secondary | ICD-10-CM | POA: Insufficient documentation

## 2022-09-08 DIAGNOSIS — I10 Essential (primary) hypertension: Secondary | ICD-10-CM | POA: Insufficient documentation

## 2022-09-08 DIAGNOSIS — E785 Hyperlipidemia, unspecified: Secondary | ICD-10-CM | POA: Insufficient documentation

## 2022-09-09 ENCOUNTER — Ambulatory Visit (HOSPITAL_COMMUNITY): Payer: No Typology Code available for payment source | Attending: Cardiology

## 2022-09-09 DIAGNOSIS — I082 Rheumatic disorders of both aortic and tricuspid valves: Secondary | ICD-10-CM | POA: Diagnosis not present

## 2022-09-09 DIAGNOSIS — I2510 Atherosclerotic heart disease of native coronary artery without angina pectoris: Secondary | ICD-10-CM

## 2022-09-09 DIAGNOSIS — Z720 Tobacco use: Secondary | ICD-10-CM | POA: Insufficient documentation

## 2022-09-09 DIAGNOSIS — E785 Hyperlipidemia, unspecified: Secondary | ICD-10-CM | POA: Diagnosis present

## 2022-09-09 DIAGNOSIS — I5030 Unspecified diastolic (congestive) heart failure: Secondary | ICD-10-CM | POA: Diagnosis not present

## 2022-09-09 DIAGNOSIS — I10 Essential (primary) hypertension: Secondary | ICD-10-CM | POA: Insufficient documentation

## 2022-09-09 DIAGNOSIS — E1169 Type 2 diabetes mellitus with other specified complication: Secondary | ICD-10-CM | POA: Diagnosis present

## 2022-09-09 DIAGNOSIS — I517 Cardiomegaly: Secondary | ICD-10-CM

## 2022-09-09 LAB — ECHOCARDIOGRAM COMPLETE
Area-P 1/2: 2.5 cm2
S' Lateral: 3.3 cm

## 2022-10-11 ENCOUNTER — Telehealth: Payer: Self-pay | Admitting: Nurse Practitioner

## 2022-10-11 DIAGNOSIS — E1129 Type 2 diabetes mellitus with other diabetic kidney complication: Secondary | ICD-10-CM

## 2022-10-11 DIAGNOSIS — I10 Essential (primary) hypertension: Secondary | ICD-10-CM

## 2022-10-11 DIAGNOSIS — E1169 Type 2 diabetes mellitus with other specified complication: Secondary | ICD-10-CM

## 2022-10-11 MED ORDER — ROSUVASTATIN CALCIUM 20 MG PO TABS: ORAL_TABLET | ORAL | 3 refills | Status: DC

## 2022-10-11 MED ORDER — LOSARTAN POTASSIUM 25 MG PO TABS: ORAL_TABLET | ORAL | 3 refills | Status: DC

## 2022-10-11 NOTE — Addendum Note (Signed)
Addended by: Dionicio Stall on: 10/11/2022 09:48 AM   Modules accepted: Orders

## 2022-10-11 NOTE — Telephone Encounter (Signed)
Requesting refill on losartan and rosuvastatin. Pls send to [facility]

## 2022-10-14 NOTE — Progress Notes (Signed)
3 MONTH FOLLOW UP   Assessment and Plan:    Essential hypertension       Lifestyle controled; consider ARB Monitor blood pressure at home; call if consistently over 130/80 Continue DASH diet.   Reminder to go to the ER if any CP, SOB, nausea, dizziness, severe HA, changes vision/speech, left arm numbness and tingling and jaw pain. -     COMPLETE METABOLIC PANEL WITH GFR -     CBC with Differential/Platelet  History of hepatitis C Treated 2016 no longer follows with GI - CMP/GFR  Hyperlipidemia associated with T2DM (HCC) Discussed LDL goal <70 Continue rosuvastatin 20 mg every day He had coronary artery calcium score of on 09/08/22 through cardiology Pending results plan to stop lofibra and start rosuvastatin Discussed dietary and exercise modifications Low fat diet -     Lipid panel  Type 2 diabetes mellitus with hyperglycemia, without long-term current use of insulin (HCC) Continue medications: MetforminXL 500mg  two tablets BID with meals. Taking glipizide 5mg  half tablet is glucsose over 200 Mounjaro 5 mg/week  defer dose change for now Discussed general issues about diabetes pathophysiology and management. Education: Reviewed 'ABCs' of diabetes management (respective goals in parentheses):  A1C (<7), blood pressure (<130/80), and cholesterol (LDL <70) Recommended to start on daily bASA, plan to add statin Dietary recommendations Encouraged aerobic exercise.  Monitor blood glucose, discussed goal for patient -     Hemoglobin A1c  Sarcoidosis Advised to stop smoking CT 09/17/22- stable Follow up Pulmonology   Thrombocytopenia (HCC) Will continue to monitor -     CBC with Diff  Recurrent major depressive disorder, in partial remission (HCC) Off of wellbutrin and feels doing well; wants to work on lifestyle stress management techniques discussed, increase water, good sleep hygiene discussed, increase exercise, and increase veggies.   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  He is interested in nicotine inhaler and was recommended through insurance. Will check with insurance on ordering    Type 2 diabetes mellitus with stage 2 chronic kidney disease, without long-term current use of insulin Continue medications:Mounjaro, Metformin, glipizide Continue diet and exercise.  Perform daily foot/skin check, notify office of any concerning changes.  Check A1C  -     Hemoglobin A1c  CKD stage 2 due to type 2 diabetes mellitus -     CBC with Differential/Platelet -     COMPLETE METABOLIC PANEL WITH GFR  Centrilobular emphysema Continue medications Strongly encouraged to cut down and quit smoking  Microalbuminuria due to type 2 diabetes mellitus Continue medications, diet and exercise  Medication management -     CBC with Differential/Platelet -     COMPLETE METABOLIC PANEL WITH GFR -     Lipid panel -     Hemoglobin A1c  Aortic valve calcification He had coronary artery calcium score of on 09/08/22 through cardiology     Further disposition pending results if labs check today. Discussed med's effects and SE's.   Over 30 minutes of face to face interview, exam, counseling, chart review, and critical decision making was performed.     Future Appointments  Date Time Provider Department Center  04/05/2023 10:00 AM Raynelle Dick, NP [facility] None    HPI Patient presents for 3 month follow up for HTN, HLD, DMIII, obesity and vitamin D deficiency.   He is following with Dr. Deanne Coffer for sarcoidosis, knee pain, DDD of lumbar;  meloxicam/voltaren didn't help, now on naproxen and sulfasalazine, he reports irregular response, has follow up next month. Off Adalimumab and started on Hyrimoz 40 mg   He smokes (wellbutrin didn't help, chantix caused nightmares, currently trying gum); hx of 0.5 pack/day for 20 years.Continues on 10 cigarettes a day. Also emphysema/sarcoidosis, follows with Dr. Vassie Loll. Shortness of breath continues, states it is manageable. Currently using PRN albuterol only.  He has failed on nicorette gum, chantix, Wellbutrin.  He is interested in nicotine inhaler and was recommended through insurance. Will check with insurance on ordering  Chest CT 06/23/22 showed calcifications of the aortic valve.  He does have occasional difficulty swallowing large pills and food.   BMI is Body mass index is 29.51 kg/m., he has been working on diet and exercise. He has been limiting carbs. He has not been exercising Wt Readings from Last 3 Encounters:  10/15/22 217 lb 9.6 oz (98.7 kg)  08/10/22 221 lb 3.2 oz (100.3 kg)  08/06/22 223 lb 6.4 oz (101.3 kg)    His blood pressure has been controlled at home, today their BP is BP: 132/68  BP Readings from Last 3 Encounters:  10/15/22 132/68  08/10/22 112/70  08/06/22 (!) 144/70  He does not workout. He denies chest pain, dizziness. Exertional dyspnea chronic/stable (sarcoid).  He is on cholesterol medication, fenofibrate for many years, never statin, and denies myalgias. His cholesterol is at goal of LDL <70.  The cholesterol last visit was:   Lab Results  Component Value Date   CHOL 92 07/12/2022   HDL 30 (L) 07/12/2022   LDLCALC 35 07/12/2022   TRIG 199 (H) 07/12/2022   CHOLHDL 3.1 07/12/2022    He has been working on diet and exercise for newly progressed T2DM, he is on metformin, mounjaro 5 mg/week, will take glipizide PRN glucose 200+, and denies paresthesia of the feet, polydipsia and polyuria.  He has glucometer, ran out of strips, needs refill. Last checks  were running around 100-120.  Last A1C in the office was:  Lab Results  Component Value Date   HGBA1C 5.2 07/12/2022   Lab Results  Component Value Date   EGFR 96 07/12/2022   Lab Results  Component Value Date   MICRALBCREAT 33 (H) 04/02/2022   Patient is on Vitamin D supplement, taking 5000 IU daily Lab Results  Component Value Date   VD25OH 43 04/02/2022     He has a history of hepatitis C, followed with Dr. Lucas Mallow, treated on 04/22/2014, Harvoni. Has had Hep B vaccine, no longer needs to follow up, we monitor here.     Current Medications:  Current Outpatient Medications on File Prior to Visit  Medication Sig Dispense Refill   ACCU-CHEK GUIDE test strip CHECK BLOOD SUGAR 1 TIME DAILY-DX-E11.9     albuterol (VENTOLIN HFA) 108 (90 Base) MCG/ACT inhaler INHALE 2 PUFFS INTO THE LUNGS EVERY 2 HOURS AS NEEDED FOR WHEEZING OR SHORTNESS OF BREATH (COUGH). 8.5 each 1   blood glucose meter kit and supplies KIT Dispense based on patient and insurance preference. Use up to four times daily as directed. 1 each 0   buPROPion (WELLBUTRIN XL) 150 MG 24 hr tablet Take 1 tablet  every Morning  for Mood, Focus & Concentration 90 tablet 3   butalbital-acetaminophen-caffeine (FIORICET) 50-325-40 MG tablet Take 1-2 tablets by mouth every 6 (six) hours as needed for headache. 20 tablet 0   famotidine (PEPCID) 40 MG tablet TAKE 1 TABLET BY MOUTH EVERY DAY IN THE EVENING 90 tablet 1   glipiZIDE (GLUCOTROL) 5 MG tablet Take 1/2 tab daily with breakfast if fasting glucose is 130+. Do not take if not eating or having low blood sugars. Take a second 1/2 tab with dinner if fasting remains persistently above 130. 90 tablet 1   HYRIMOZ 40 MG/0.8ML SOAJ Inject 1 pen under the skin every 14 days for 28 days     losartan (COZAAR) 25 MG tablet Take 1 tab daily for blood pressure and diabetes kidney protection. 90 tablet 3   metFORMIN (GLUCOPHAGE-XR) 500 MG 24 hr tablet TAKE 2 TABLETS 2 X /DAY WITH MEALS FOR DIABETES  360 tablet 3   naproxen (NAPROSYN) 500 MG tablet SMARTSIG:1 Tablet(s) By Mouth Every 12 Hours PRN     pantoprazole (PROTONIX) 40 MG tablet Take 1 tablet (40 mg total) by mouth daily as needed. (Patient not taking: Reported on 08/06/2022) 90 tablet 0   rosuvastatin (CRESTOR) 20 MG tablet Take 1 tab daily for cholesterol. 90 tablet 3   tirzepatide (MOUNJARO) 5 MG/0.5ML Pen INJECT 5 MG SUBCUTANEOUSLY WEEKLY 2 mL 2   VITAMIN D, CHOLECALCIFEROL, PO Take 10,000 Units by mouth daily.     No current facility-administered medications on file prior to visit.    Allergies:  Allergies  Allergen Reactions   Chantix [Varenicline]     Nausea   Lopid [Gemfibrozil]     Nausea/weakness   Medical History:  Past Medical History:  Diagnosis Date   Colon polyps    Diabetes mellitus without complication (HCC)    Hepatitis C    treated for hepatatis but not sure what type   Hiatal hernia    Marijuana use    Osteoarthritis    Sarcoid    Thrombocytopenia (HCC)    Tobacco abuse    SURGICAL HISTORY He  has a past surgical history that includes Appendectomy; HEAD TRAUMA (1975); and left heart catheterization with coronary angiogram (N/A, 05/17/2012). FAMILY HISTORY His family history includes Cancer in his father; Diabetes Mellitus II in his mother; Leukemia in his mother; Other in an other family member; Sarcoidosis in his paternal aunt and sister. SOCIAL HISTORY He  reports that he has been smoking cigarettes. He has a 15 pack-year smoking history. He has never used smokeless tobacco. He reports current alcohol use of about 6.0 standard drinks of alcohol per week. He reports current drug use. Frequency: 2.00 times per week. Drug: Marijuana.  Review of Systems  Constitutional: Negative.  Negative for chills, diaphoresis, fever, malaise/fatigue and weight loss.  HENT:  Negative for congestion, ear discharge, ear pain, hearing loss, nosebleeds, sore throat and tinnitus.   Eyes: Negative.  Negative for  blurred vision and double vision.  Respiratory:  Positive for shortness of breath (chronic exertional, stable). Negative for cough, wheezing and stridor.   Cardiovascular: Negative.  Negative for chest pain and leg swelling.  Gastrointestinal:  Positive for heartburn (controlled). Negative for abdominal pain, constipation, diarrhea, nausea and vomiting.  Genitourinary: Negative.  Negative for dysuria.  Musculoskeletal:  Positive for back pain and joint pain (knee pain). Negative for falls, myalgias and neck pain.  Skin: Negative.  Negative for rash.  Neurological: Negative.  Negative for dizziness, weakness and  headaches.  Endo/Heme/Allergies:  Does not bruise/bleed easily.  Psychiatric/Behavioral: Negative.  Negative for depression. The patient does not have insomnia.      Physical Exam: Estimated body mass index is 29.51 kg/m as calculated from the following:   Height as of this encounter: 6' (1.829 m).   Weight as of this encounter: 217 lb 9.6 oz (98.7 kg). BP 132/68   Pulse 81   Temp 97.7 F (36.5 C)   Ht 6' (1.829 m)   Wt 217 lb 9.6 oz (98.7 kg)   SpO2 98%   BMI 29.51 kg/m  General Appearance: Obese, in no apparent distress.  Eyes: PERRLA, EOMs, conjunctiva no swelling or erythema Neck: Supple, thyroid normal.  Respiratory: Respiratory effort normal, no wheezing, rhonchi, crackles.  Cardio: RRR without murmurs, rubs or gallops. Brisk peripheral pulses without edema.  Chest: symmetric, with normal excursions and percussion.  Abdomen: Soft, nontender, obese no guarding, rebound, hernias, masses, or organomegaly.  Lymphatics: Non tender without lymphadenopathy.  Musculoskeletal: Full ROM all peripheral extremities,5/5 strength, and antalgic gait.  Neurological Exam: normal  Vascular Exam: normal. Skin: Warm, dry without rashes, lesions, ecchymosis. Neuro: Cranial nerves intact, reflexes equal bilaterally. Normal muscle tone, no cerebellar symptoms. Psych: Awake and oriented  X 3, normal affect, Insight and Judgment appropriate.   Raynelle Dick, NP 5:09 PM [facility]

## 2022-10-15 ENCOUNTER — Encounter: Payer: Self-pay | Admitting: Nurse Practitioner

## 2022-10-15 ENCOUNTER — Ambulatory Visit (INDEPENDENT_AMBULATORY_CARE_PROVIDER_SITE_OTHER): Payer: No Typology Code available for payment source | Admitting: Nurse Practitioner

## 2022-10-15 VITALS — BP 132/68 | HR 81 | Temp 97.7°F | Ht 72.0 in | Wt 217.6 lb

## 2022-10-15 DIAGNOSIS — J432 Centrilobular emphysema: Secondary | ICD-10-CM

## 2022-10-15 DIAGNOSIS — E669 Obesity, unspecified: Secondary | ICD-10-CM

## 2022-10-15 DIAGNOSIS — Z720 Tobacco use: Secondary | ICD-10-CM

## 2022-10-15 DIAGNOSIS — D869 Sarcoidosis, unspecified: Secondary | ICD-10-CM

## 2022-10-15 DIAGNOSIS — F3341 Major depressive disorder, recurrent, in partial remission: Secondary | ICD-10-CM

## 2022-10-15 DIAGNOSIS — J439 Emphysema, unspecified: Secondary | ICD-10-CM

## 2022-10-15 DIAGNOSIS — E559 Vitamin D deficiency, unspecified: Secondary | ICD-10-CM

## 2022-10-15 DIAGNOSIS — Z79899 Other long term (current) drug therapy: Secondary | ICD-10-CM

## 2022-10-15 DIAGNOSIS — E1169 Type 2 diabetes mellitus with other specified complication: Secondary | ICD-10-CM

## 2022-10-15 DIAGNOSIS — E1165 Type 2 diabetes mellitus with hyperglycemia: Secondary | ICD-10-CM | POA: Diagnosis not present

## 2022-10-15 DIAGNOSIS — I10 Essential (primary) hypertension: Secondary | ICD-10-CM | POA: Diagnosis not present

## 2022-10-15 DIAGNOSIS — E1129 Type 2 diabetes mellitus with other diabetic kidney complication: Secondary | ICD-10-CM

## 2022-10-15 DIAGNOSIS — E1122 Type 2 diabetes mellitus with diabetic chronic kidney disease: Secondary | ICD-10-CM | POA: Diagnosis not present

## 2022-10-15 DIAGNOSIS — N182 Chronic kidney disease, stage 2 (mild): Secondary | ICD-10-CM

## 2022-10-15 DIAGNOSIS — E785 Hyperlipidemia, unspecified: Secondary | ICD-10-CM

## 2022-10-15 DIAGNOSIS — R809 Proteinuria, unspecified: Secondary | ICD-10-CM

## 2022-10-15 DIAGNOSIS — D696 Thrombocytopenia, unspecified: Secondary | ICD-10-CM

## 2022-10-15 DIAGNOSIS — I359 Nonrheumatic aortic valve disorder, unspecified: Secondary | ICD-10-CM

## 2022-10-15 NOTE — Patient Instructions (Signed)

## 2022-10-16 LAB — CBC WITH DIFFERENTIAL/PLATELET
Eosinophils Relative: 4.5 %
HCT: 50.4 % — ABNORMAL HIGH (ref 38.5–50.0)
MCH: 28 pg (ref 27.0–33.0)
MCHC: 31.5 g/dL — ABNORMAL LOW (ref 32.0–36.0)
Monocytes Relative: 6.8 %
Platelets: 156 10*3/uL (ref 140–400)
RBC: 5.68 10*6/uL (ref 4.20–5.80)
Total Lymphocyte: 22.2 %
WBC: 7 10*3/uL (ref 3.8–10.8)

## 2022-10-16 LAB — COMPLETE METABOLIC PANEL WITH GFR
AG Ratio: 1.8 (calc) (ref 1.0–2.5)
BUN: 10 mg/dL (ref 7–25)
CO2: 26 mmol/L (ref 20–32)
Chloride: 106 mmol/L (ref 98–110)
Creat: 0.89 mg/dL (ref 0.70–1.35)
Glucose, Bld: 174 mg/dL — ABNORMAL HIGH (ref 65–99)
Sodium: 139 mmol/L (ref 135–146)
Total Protein: 6.6 g/dL (ref 6.1–8.1)
eGFR: 95 mL/min/{1.73_m2} (ref >=60)

## 2022-10-16 LAB — LIPID PANEL
Cholesterol: 113 mg/dL (ref <200)
HDL: 32 mg/dL — ABNORMAL LOW (ref >=40)
Non-HDL Cholesterol (Calc): 81 mg/dL (calc) (ref <130)
Total CHOL/HDL Ratio: 3.5 (calc) (ref <5.0)
Triglycerides: 236 mg/dL — ABNORMAL HIGH (ref <150)

## 2022-10-19 ENCOUNTER — Other Ambulatory Visit: Payer: Self-pay | Admitting: Nurse Practitioner

## 2022-10-19 LAB — COMPLETE METABOLIC PANEL WITH GFR
ALT: 32 U/L (ref 9–46)
AST: 19 U/L (ref 10–35)
Albumin: 4.2 g/dL (ref 3.6–5.1)
Alkaline phosphatase (APISO): 79 U/L (ref 35–144)
Calcium: 9.8 mg/dL (ref 8.6–10.3)
Globulin: 2.4 g/dL (calc) (ref 1.9–3.7)
Potassium: 4.1 mmol/L (ref 3.5–5.3)
Total Bilirubin: 0.6 mg/dL (ref 0.2–1.2)

## 2022-10-19 LAB — CBC WITH DIFFERENTIAL/PLATELET
Absolute Monocytes: 476 cells/uL (ref 200–950)
Basophils Absolute: 49 cells/uL (ref 0–200)
Basophils Relative: 0.7 %
Eosinophils Absolute: 315 cells/uL (ref 15–500)
Hemoglobin: 15.9 g/dL (ref 13.2–17.1)
Lymphs Abs: 1554 cells/uL (ref 850–3900)
MCV: 88.7 fL (ref 80.0–100.0)
MPV: 10.2 fL (ref 7.5–12.5)
Neutro Abs: 4606 cells/uL (ref 1500–7800)
Neutrophils Relative %: 65.8 %
RDW: 13 % (ref 11.0–15.0)

## 2022-10-19 LAB — LIPID PANEL: LDL Cholesterol (Calc): 52 mg/dL (calc)

## 2022-10-19 LAB — HEMOGLOBIN A1C W/OUT EAG: Hgb A1c MFr Bld: 5.3 % of total Hgb (ref <5.7)

## 2022-12-01 ENCOUNTER — Other Ambulatory Visit: Payer: Self-pay | Admitting: Nurse Practitioner

## 2023-01-05 ENCOUNTER — Other Ambulatory Visit: Payer: Self-pay | Admitting: Nurse Practitioner

## 2023-01-05 ENCOUNTER — Telehealth: Payer: Self-pay | Admitting: Nurse Practitioner

## 2023-01-05 DIAGNOSIS — E1169 Type 2 diabetes mellitus with other specified complication: Secondary | ICD-10-CM

## 2023-01-05 DIAGNOSIS — I10 Essential (primary) hypertension: Secondary | ICD-10-CM

## 2023-01-05 DIAGNOSIS — E1165 Type 2 diabetes mellitus with hyperglycemia: Secondary | ICD-10-CM

## 2023-01-05 DIAGNOSIS — E1129 Type 2 diabetes mellitus with other diabetic kidney complication: Secondary | ICD-10-CM

## 2023-01-05 MED ORDER — ROSUVASTATIN CALCIUM 20 MG PO TABS: ORAL_TABLET | ORAL | 3 refills | Status: AC

## 2023-01-05 MED ORDER — LOSARTAN POTASSIUM 25 MG PO TABS: ORAL_TABLET | ORAL | 3 refills | Status: AC

## 2023-01-05 MED ORDER — MOUNJARO 5 MG/0.5ML ~~LOC~~ SOAJ: 5.0000 mg | SUBCUTANEOUS | 2 refills | Status: AC

## 2023-01-05 NOTE — Telephone Encounter (Signed)
Patient is requesting refills on Losartan, Rosuvastatin and [facility]ounjaro to [facility] [address].

## 2023-01-07 ENCOUNTER — Other Ambulatory Visit: Payer: Self-pay | Admitting: Nurse Practitioner

## 2023-01-07 DIAGNOSIS — Z720 Tobacco use: Secondary | ICD-10-CM

## 2023-01-19 ENCOUNTER — Encounter: Payer: Self-pay | Admitting: Internal Medicine

## 2023-01-19 NOTE — Progress Notes (Signed)
3 MONTH FOLLOW UP   Assessment and Plan:    Essential hypertension       Consider Losartan 25 mg every day  Monitor blood pressure at home; call if consistently over 130/80 Continue DASH diet.   Reminder to go to the ER if any CP, SOB, nausea, dizziness, severe HA, changes vision/speech, left arm numbness and tingling and jaw pain. -     COMPLETE METABOLIC PANEL WITH GFR -     CBC with Differential/Platelet  History of hepatitis C Treated 2016 no longer follows with GI - CMP/GFR  Hyperlipidemia associated with T2DM (HCC) Discussed LDL goal <70 Continue rosuvastatin 20 mg every day He had coronary artery calcium score of on 09/08/22 through cardiology Pending results plan to stop lofibra and start rosuvastatin Discussed dietary and exercise modifications Low fat diet -     Lipid panel  Type 2 diabetes mellitus with hyperglycemia, without long-term current use of insulin (HCC) Continue medications: MetforminXL 500mg  two tablets BID with meals. Taking glipizide 5mg  half tablet is glucsose over 200 Mounjaro 5 mg/week  defer dose change for now Discussed general issues about diabetes pathophysiology and management. Education: Reviewed 'ABCs' of diabetes management (respective goals in parentheses):  A1C (<7), blood pressure (<130/80), and cholesterol (LDL <70) Recommended to start on daily bASA, plan to add statin Dietary recommendations Encouraged aerobic exercise.  Monitor blood glucose, discussed goal for patient -     Hemoglobin A1c  Sarcoidosis Advised to stop smoking CT 09/17/22- stable Follow up Pulmonology   Thrombocytopenia (HCC) Will continue to monitor -     CBC with Diff  Recurrent major depressive disorder, in partial remission (HCC) Off of wellbutrin and feels doing well; wants to work on lifestyle stress management techniques discussed, increase water, good sleep hygiene discussed, increase exercise, and increase veggies.   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   Doing accordant program through insurance Nicotrol inhaler sent to pharmacy to try help stop smoking    Type 2 diabetes mellitus with stage 2 chronic kidney disease, without long-term current use of insulin Continue medications:Mounjaro 5 mg SQ QW, Metformin 500 mg 2 tabs BID, glipizide PRN Continue diet and exercise.  Perform daily foot/skin check, notify office of any concerning changes.  Check A1C  -     Hemoglobin A1c  CKD stage 2 due to type 2 diabetes mellitus -     CBC with Differential/Platelet -     COMPLETE METABOLIC PANEL WITH GFR   Microalbuminuria due to type 2 diabetes mellitus Continue medications, diet and exercise - Microalbumin/creatinine urine ratio  Medication management -     CBC with Differential/Platelet -     COMPLETE METABOLIC PANEL WITH GFR -     Lipid panel -     Hemoglobin A1c  Aortic valve calcification He had coronary artery calcium score of on 09/08/22 through cardiology     Further disposition pending results if labs check today. Discussed med's effects and SE's.   Over 30 minutes of face to face interview, exam, counseling, chart review, and critical decision making was performed.     Future Appointments  Date Time Provider Department  Center  04/05/2023 10:00 AM Raynelle Dick, NP [facility] None    HPI Patient presents for 3 month follow up for HTN, HLD, DMIII, obesity and vitamin D deficiency.  He is following with Dr. Deanne Coffer for sarcoidosis, knee pain, DDD of lumbar; meloxicam/voltaren didn't help, now on naproxen . Off Adalimumab and  started on Hyrimoz 40 mg   He smokes (wellbutrin didn't help, chantix caused nightmares, currently trying gum); hx of 0.5 pack/day for 20 years.Continues on 10 cigarettes a day. Also emphysema/sarcoidosis, follows with Dr. Vassie Loll. Shortness of breath continues, states it is manageable. Currently using PRN albuterol only.  He has failed on nicorette gum, chantix, Wellbutrin. He is in accordant program- recommend nicotrol inhaler  Chest CT 06/23/22 showed calcifications of the aortic valve.  He does have occasional difficulty swallowing large pills and food.   BMI is Body mass index is 30.65 kg/m., he has been working on diet and exercise. He has been limiting carbs. He has not been exercising. Up 9 pounds since not taking Mounjaro Wt Readings from Last 3 Encounters:  01/21/23 226 lb (102.5 kg)  10/15/22 217 lb 9.6 oz (98.7 kg)  08/10/22 221 lb 3.2 oz (100.3 kg)    His blood pressure has been controlled at home, today their BP is BP: 136/74  BP Readings from Last 3 Encounters:  01/21/23 136/74  10/15/22 132/68  08/10/22 112/70  He does not workout. He denies chest pain, dizziness. Exertional dyspnea chronic/stable (sarcoid).  He is on cholesterol medication, fenofibrate for many years, Rosuvastatin 20 mg every day  and denies myalgias. His cholesterol is at goal of LDL <70.  The cholesterol last visit was:   Lab Results  Component Value Date   CHOL 113 10/15/2022   HDL 32 (L) 10/15/2022   LDLCALC 52 10/15/2022   TRIG 236 (H) 10/15/2022   CHOLHDL 3.5 10/15/2022    He has been working on diet and exercise for newly progressed T2DM, he is on metformin, mounjaro 5 mg/week, will take glipizide PRN glucose 200+, and denies paresthesia of the feet, polydipsia and polyuria. He has been off Mounjar x 2 months but has medication to restart He has glucometer, ran out of strips, needs refill. This am BS was 127.  Normal range 90-110 Last A1C in the office was:  Lab Results  Component Value Date    HGBA1C 5.3 10/15/2022   He is trying to drink more water. Lab Results  Component Value Date   EGFR 95 10/15/2022   Lab Results  Component Value Date   MICRALBCREAT 33 (H) 04/02/2022   Patient is on Vitamin D supplement, taking 5000 IU daily Lab Results  Component Value Date   VD25OH 75 04/02/2022     He has a history of hepatitis C, followed with Dr. Lucas Mallow, treated on 04/22/2014, Harvoni. Has had Hep B vaccine, no longer needs to follow up, we monitor here.     Current Medications:  Current Outpatient Medications on File Prior to Visit  Medication Sig Dispense Refill   buPROPion (WELLBUTRIN XL) 150 MG 24 hr tablet TAKE 1 TABLET BY MOUTH EVERY MORNING FOR MOOD, FOCUS & CONCENTRATION 90 tablet 3   butalbital-acetaminophen-caffeine (FIORICET) 50-325-40 MG tablet Take 1-2 tablets by mouth every 6 (six) hours as needed for headache. 20 tablet 0   famotidine (PEPCID) 40 MG tablet TAKE 1 TABLET BY MOUTH EVERY DAY IN THE EVENING 90 tablet 1   glipiZIDE (GLUCOTROL) 5 MG tablet Take 1/2 tab daily with breakfast if  fasting glucose is 130+. Do not take if not eating or having low blood sugars. Take a second 1/2 tab with dinner if fasting remains persistently above 130. 90 tablet 1   HYRIMOZ 40 MG/0.8ML SOAJ Inject 1 pen under the skin every 14 days for 28 days     losartan (COZAAR) 25 MG tablet Take 1 tab daily for blood pressure and diabetes kidney protection. 90 tablet 3   metFORMIN (GLUCOPHAGE-XR) 500 MG 24 hr tablet TAKE 2 TABLETS BY MOUTH TWO TIMES DAILY WITH MEALS FOR DIABETES 360 tablet 3   naproxen (NAPROSYN) 500 MG tablet SMARTSIG:1 Tablet(s) By Mouth Every 12 Hours PRN     pantoprazole (PROTONIX) 40 MG tablet Take 1 tablet (40 mg total) by mouth daily as needed. 90 tablet 0   rosuvastatin (CRESTOR) 20 MG tablet Take 1 tab daily for cholesterol. 90 tablet 3   tirzepatide (MOUNJARO) 5 MG/0.5ML Pen Inject 5 mg into the skin once a week. 2 mL 2   VITAMIN D, CHOLECALCIFEROL, PO Take 10,000  Units by mouth daily.     ACCU-CHEK GUIDE test strip CHECK BLOOD SUGAR 1 TIME DAILY-DX-E11.9     albuterol (VENTOLIN HFA) 108 (90 Base) MCG/ACT inhaler INHALE 2 PUFFS INTO THE LUNGS EVERY 2 HOURS AS NEEDED FOR WHEEZING OR SHORTNESS OF BREATH (COUGH). (Patient not taking: Reported on 01/21/2023) 8.5 each 1   blood glucose meter kit and supplies KIT Dispense based on patient and insurance preference. Use up to four times daily as directed. 1 each 0   No current facility-administered medications on file prior to visit.    Allergies:  Allergies  Allergen Reactions   Chantix [Varenicline]     Nausea   Lopid [Gemfibrozil]     Nausea/weakness   Medical History:  Past Medical History:  Diagnosis Date   Colon polyps    Diabetes mellitus without complication (HCC)    Hepatitis C    treated for hepatatis but not sure what type   Hiatal hernia    Marijuana use    Osteoarthritis    Sarcoid    Thrombocytopenia (HCC)    Tobacco abuse    SURGICAL HISTORY He  has a past surgical history that includes Appendectomy; HEAD TRAUMA (1975); and left heart catheterization with coronary angiogram (N/A, 05/17/2012). FAMILY HISTORY His family history includes Cancer in his father; Diabetes Mellitus II in his mother; Leukemia in his mother; Other in an other family member; Sarcoidosis in his paternal aunt and sister. SOCIAL HISTORY He  reports that he has been smoking cigarettes. He has a 15 pack-year smoking history. He has never used smokeless tobacco. He reports current alcohol use of about 6.0 standard drinks of alcohol per week. He reports current drug use. Frequency: 2.00 times per week. Drug: Marijuana.  Review of Systems  Constitutional: Negative.  Negative for chills, diaphoresis, fever, malaise/fatigue and weight loss.  HENT:  Negative for congestion, ear discharge, ear pain, hearing loss, nosebleeds, sore throat and tinnitus.   Eyes: Negative.  Negative for blurred vision and double vision.   Respiratory:  Positive for shortness of breath (chronic exertional, stable). Negative for cough, wheezing and stridor.   Cardiovascular: Negative.  Negative for chest pain and leg swelling.  Gastrointestinal:  Positive for heartburn (controlled). Negative for abdominal pain, constipation, diarrhea, nausea and vomiting.  Genitourinary: Negative.  Negative for dysuria.  Musculoskeletal:  Positive for back pain and joint pain (knee pain). Negative for falls, myalgias and neck pain.  Skin: Negative.  Negative for  rash.  Neurological:  Positive for tingling (bottom of feet). Negative for dizziness, weakness and headaches.  Endo/Heme/Allergies:  Does not bruise/bleed easily.  Psychiatric/Behavioral: Negative.  Negative for depression. The patient does not have insomnia.      Physical Exam: Estimated body mass index is 30.65 kg/m as calculated from the following:   Height as of this encounter: 6' (1.829 m).   Weight as of this encounter: 226 lb (102.5 kg). BP 136/74   Pulse 81   Temp 97.7 F (36.5 C)   Ht 6' (1.829 m)   Wt 226 lb (102.5 kg)   SpO2 98%   BMI 30.65 kg/m  General Appearance: Obese, in no apparent distress.  Eyes: PERRLA, EOMs, conjunctiva no swelling or erythema Neck: Supple, thyroid normal.  Respiratory: Respiratory effort normal, no wheezing, rhonchi, crackles.  Cardio: RRR without murmurs, rubs or gallops. Brisk peripheral pulses without edema.  Chest: symmetric, with normal excursions and percussion.  Abdomen: Soft, nontender, obese no guarding, rebound, hernias, masses, or organomegaly.  Lymphatics: Non tender without lymphadenopathy.  Musculoskeletal: Full ROM all peripheral extremities,5/5 strength, and antalgic gait.  Neurological Exam: normal  Vascular Exam: normal. Skin: Warm, dry without rashes, lesions, ecchymosis. Neuro: Cranial nerves intact, reflexes equal bilaterally. Normal muscle tone, no cerebellar symptoms. Sensation intact Psych: Awake and oriented  X 3, normal affect, Insight and Judgment appropriate.   Raynelle Dick, NP 5:09 PM [facility]

## 2023-01-21 ENCOUNTER — Ambulatory Visit (INDEPENDENT_AMBULATORY_CARE_PROVIDER_SITE_OTHER): Payer: No Typology Code available for payment source | Admitting: Nurse Practitioner

## 2023-01-21 ENCOUNTER — Encounter: Payer: Self-pay | Admitting: Nurse Practitioner

## 2023-01-21 VITALS — BP 136/74 | HR 81 | Temp 97.7°F | Ht 72.0 in | Wt 226.0 lb

## 2023-01-21 DIAGNOSIS — E1165 Type 2 diabetes mellitus with hyperglycemia: Secondary | ICD-10-CM | POA: Diagnosis not present

## 2023-01-21 DIAGNOSIS — D869 Sarcoidosis, unspecified: Secondary | ICD-10-CM

## 2023-01-21 DIAGNOSIS — Z1211 Encounter for screening for malignant neoplasm of colon: Secondary | ICD-10-CM

## 2023-01-21 DIAGNOSIS — J432 Centrilobular emphysema: Secondary | ICD-10-CM | POA: Diagnosis not present

## 2023-01-21 DIAGNOSIS — E1169 Type 2 diabetes mellitus with other specified complication: Secondary | ICD-10-CM | POA: Diagnosis not present

## 2023-01-21 DIAGNOSIS — I359 Nonrheumatic aortic valve disorder, unspecified: Secondary | ICD-10-CM

## 2023-01-21 DIAGNOSIS — E1122 Type 2 diabetes mellitus with diabetic chronic kidney disease: Secondary | ICD-10-CM | POA: Diagnosis not present

## 2023-01-21 DIAGNOSIS — D696 Thrombocytopenia, unspecified: Secondary | ICD-10-CM

## 2023-01-21 DIAGNOSIS — E1129 Type 2 diabetes mellitus with other diabetic kidney complication: Secondary | ICD-10-CM

## 2023-01-21 DIAGNOSIS — N182 Chronic kidney disease, stage 2 (mild): Secondary | ICD-10-CM

## 2023-01-21 DIAGNOSIS — F3341 Major depressive disorder, recurrent, in partial remission: Secondary | ICD-10-CM

## 2023-01-21 DIAGNOSIS — E669 Obesity, unspecified: Secondary | ICD-10-CM

## 2023-01-21 DIAGNOSIS — J439 Emphysema, unspecified: Secondary | ICD-10-CM

## 2023-01-21 DIAGNOSIS — R809 Proteinuria, unspecified: Secondary | ICD-10-CM

## 2023-01-21 DIAGNOSIS — Z79899 Other long term (current) drug therapy: Secondary | ICD-10-CM

## 2023-01-21 DIAGNOSIS — E559 Vitamin D deficiency, unspecified: Secondary | ICD-10-CM

## 2023-01-21 DIAGNOSIS — Z720 Tobacco use: Secondary | ICD-10-CM

## 2023-01-21 DIAGNOSIS — E785 Hyperlipidemia, unspecified: Secondary | ICD-10-CM

## 2023-01-21 MED ORDER — NICOTINE 10 MG IN INHA: 1.0000 | RESPIRATORY_TRACT | 0 refills | Status: DC | PRN

## 2023-01-21 NOTE — Patient Instructions (Signed)

## 2023-01-22 LAB — CBC WITH DIFFERENTIAL/PLATELET
Absolute Lymphocytes: 1264 {cells}/uL (ref 850–3900)
Absolute Monocytes: 360 {cells}/uL (ref 200–950)
Basophils Absolute: 58 {cells}/uL (ref 0–200)
Basophils Relative: 1 %
Eosinophils Absolute: 220 {cells}/uL (ref 15–500)
Eosinophils Relative: 3.8 %
HCT: 47 % (ref 38.5–50.0)
Hemoglobin: 15.2 g/dL (ref 13.2–17.1)
MCH: 28.4 pg (ref 27.0–33.0)
MCHC: 32.3 g/dL (ref 32.0–36.0)
MCV: 87.9 fL (ref 80.0–100.0)
MPV: 10.2 fL (ref 7.5–12.5)
Monocytes Relative: 6.2 %
Neutro Abs: 3898 {cells}/uL (ref 1500–7800)
Neutrophils Relative %: 67.2 %
Platelets: 202 10*3/uL (ref 140–400)
RBC: 5.35 10*6/uL (ref 4.20–5.80)
RDW: 12.6 % (ref 11.0–15.0)
Total Lymphocyte: 21.8 %
WBC: 5.8 10*3/uL (ref 3.8–10.8)

## 2023-01-22 LAB — COMPLETE METABOLIC PANEL WITH GFR
AG Ratio: 1.8 (calc) (ref 1.0–2.5)
ALT: 18 U/L (ref 9–46)
AST: 14 U/L (ref 10–35)
Albumin: 4.2 g/dL (ref 3.6–5.1)
Alkaline phosphatase (APISO): 81 U/L (ref 35–144)
BUN: 10 mg/dL (ref 7–25)
CO2: 27 mmol/L (ref 20–32)
Calcium: 9.4 mg/dL (ref 8.6–10.3)
Chloride: 108 mmol/L (ref 98–110)
Creat: 1 mg/dL (ref 0.70–1.35)
Globulin: 2.3 g/dL (ref 1.9–3.7)
Glucose, Bld: 140 mg/dL — ABNORMAL HIGH (ref 65–139)
Potassium: 4 mmol/L (ref 3.5–5.3)
Sodium: 143 mmol/L (ref 135–146)
Total Bilirubin: 0.5 mg/dL (ref 0.2–1.2)
Total Protein: 6.5 g/dL (ref 6.1–8.1)
eGFR: 84 mL/min/{1.73_m2} (ref >=60)

## 2023-01-22 LAB — MICROALBUMIN / CREATININE URINE RATIO
Creatinine, Urine: 122 mg/dL (ref 20–320)
Microalb Creat Ratio: 39 mg/g{creat} — ABNORMAL HIGH (ref <30)
Microalb, Ur: 4.7 mg/dL

## 2023-01-22 LAB — LIPID PANEL
Cholesterol: 94 mg/dL (ref <200)
HDL: 27 mg/dL — ABNORMAL LOW (ref >=40)
LDL Cholesterol (Calc): 38 mg/dL
Non-HDL Cholesterol (Calc): 67 mg/dL (ref <130)
Total CHOL/HDL Ratio: 3.5 (calc) (ref <5.0)
Triglycerides: 231 mg/dL — ABNORMAL HIGH (ref <150)

## 2023-01-22 LAB — HEMOGLOBIN A1C W/OUT EAG: Hgb A1c MFr Bld: 5.3 %{Hb} (ref <5.7)

## 2023-01-26 ENCOUNTER — Telehealth: Payer: Self-pay | Admitting: Nurse Practitioner

## 2023-01-26 NOTE — Telephone Encounter (Signed)
Pt said you were going to send in an inhaler that was dicussed at last appointment. Pls send to  [id number] - [place], [place] - [address]

## 2023-01-26 NOTE — Telephone Encounter (Signed)
I sent a nicotrol inhaler to [facility] [address] at his appointment I would have him call pharmacy

## 2023-02-07 ENCOUNTER — Telehealth: Payer: Self-pay | Admitting: Nurse Practitioner

## 2023-02-07 ENCOUNTER — Other Ambulatory Visit: Payer: Self-pay | Admitting: Nurse Practitioner

## 2023-02-07 DIAGNOSIS — Z720 Tobacco use: Secondary | ICD-10-CM

## 2023-02-07 MED ORDER — NICOTINE 10 MG IN INHA: 1.0000 | RESPIRATORY_TRACT | 0 refills | Status: DC | PRN

## 2023-02-07 NOTE — Telephone Encounter (Signed)
I have sent this in for the third time to [facility]- he needs to call and see if they can not fill or what is the issue

## 2023-02-07 NOTE — Telephone Encounter (Signed)
Pt said [facility] off [address] is out of Nicotrol inhaler.

## 2023-02-08 ENCOUNTER — Other Ambulatory Visit: Payer: Self-pay | Admitting: Nurse Practitioner

## 2023-02-08 DIAGNOSIS — Z720 Tobacco use: Secondary | ICD-10-CM

## 2023-02-08 MED ORDER — NICOTINE 7 MG/24HR TD PT24: 7.0000 mg | MEDICATED_PATCH | TRANSDERMAL | 3 refills | Status: AC

## 2023-02-08 NOTE — Telephone Encounter (Signed)
Please advise I have sent in a prescription for nicoderm patches to use 1 patch daily

## 2023-02-08 NOTE — Telephone Encounter (Signed)
Wife called pharm is clarify what was going on. The Nicotrol inhaler is not being made anymore. So pt would need an alternative. They did some research and pt was interested in the patches. Would the patches be a written script or over the counter?

## 2023-04-05 ENCOUNTER — Encounter: Payer: No Typology Code available for payment source | Admitting: Nurse Practitioner

## 2023-04-13 ENCOUNTER — Other Ambulatory Visit: Payer: Self-pay | Admitting: Nurse Practitioner

## 2023-04-27 ENCOUNTER — Encounter: Payer: No Typology Code available for payment source | Admitting: Nurse Practitioner

## 2023-04-28 ENCOUNTER — Encounter: Payer: No Typology Code available for payment source | Admitting: Nurse Practitioner

## 2023-07-11 IMAGING — CR DG LUMBAR SPINE COMPLETE 4+V
5 series · 5 of 5 positions shown · non-contrast
Comparison: Lumbar radiograph 12/17/2019

CLINICAL DATA: One week of lumbar back pain. History of lumbar disc
disease. Patient reports acute left-sided low back pain for 1 week
without radiation.

EXAM:
LUMBAR SPINE - COMPLETE 4+ VIEW

[w lumbar spine ap]
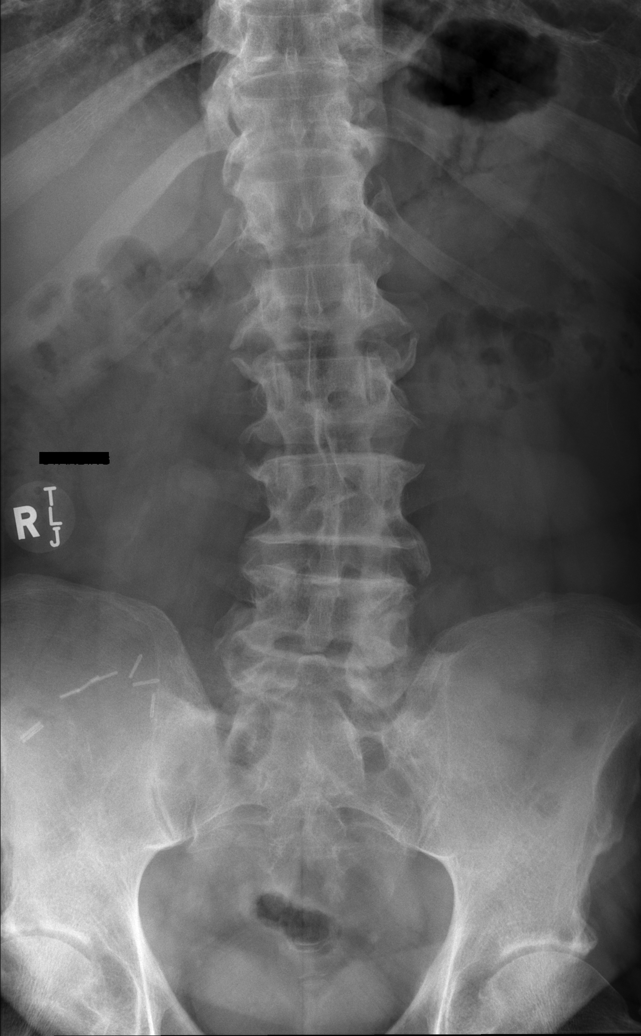

[w lumbar spine obl (1 of 2)]
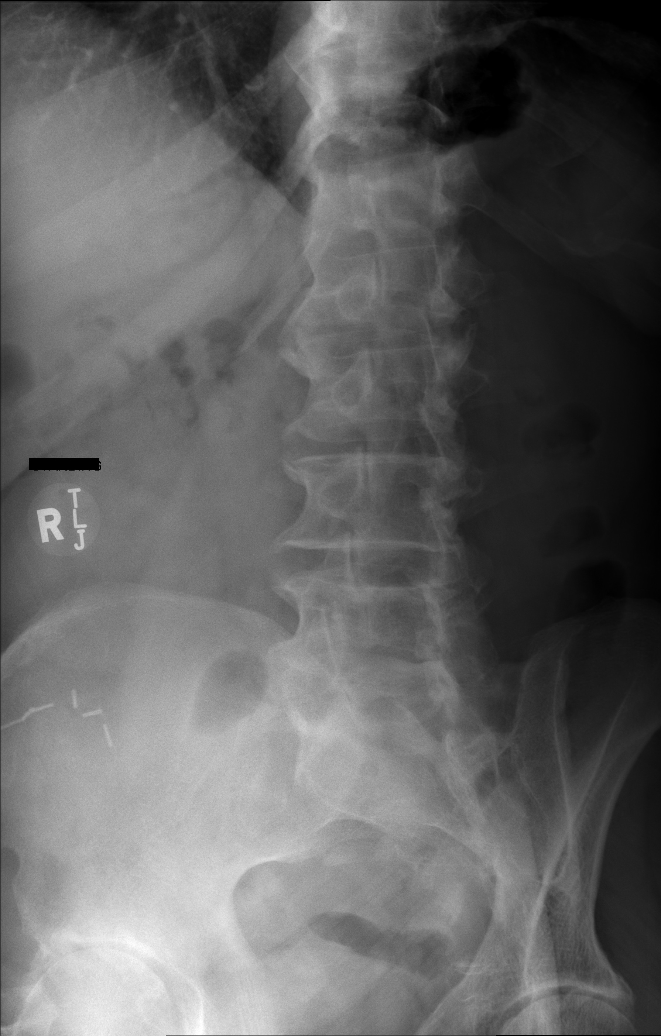

[w lumbar spine obl (2 of 2)]
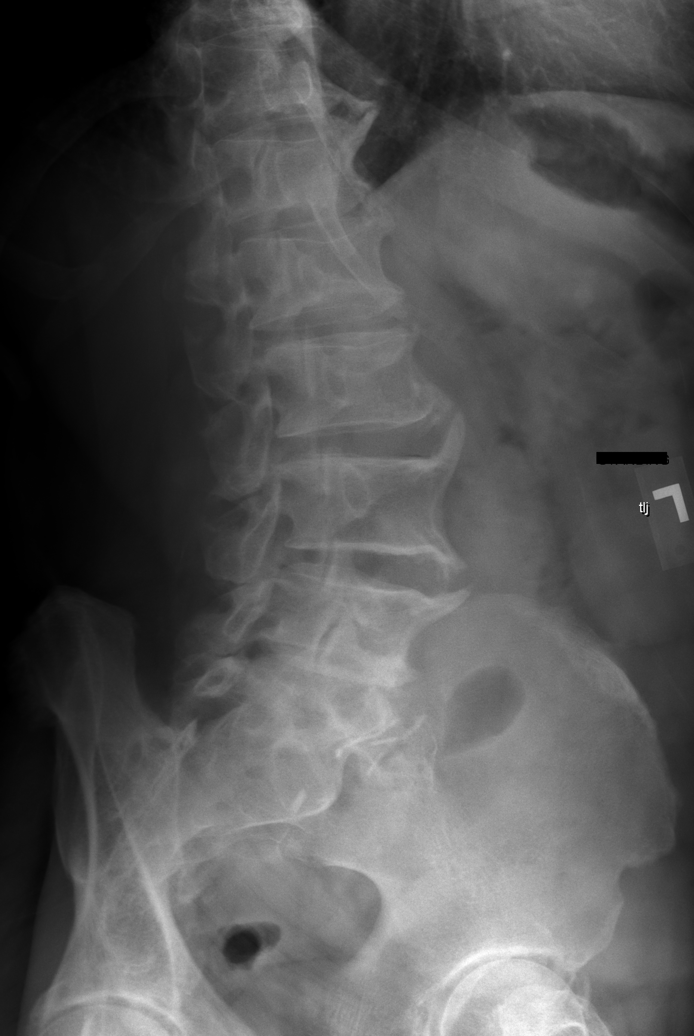

[w lumbar spine lat]
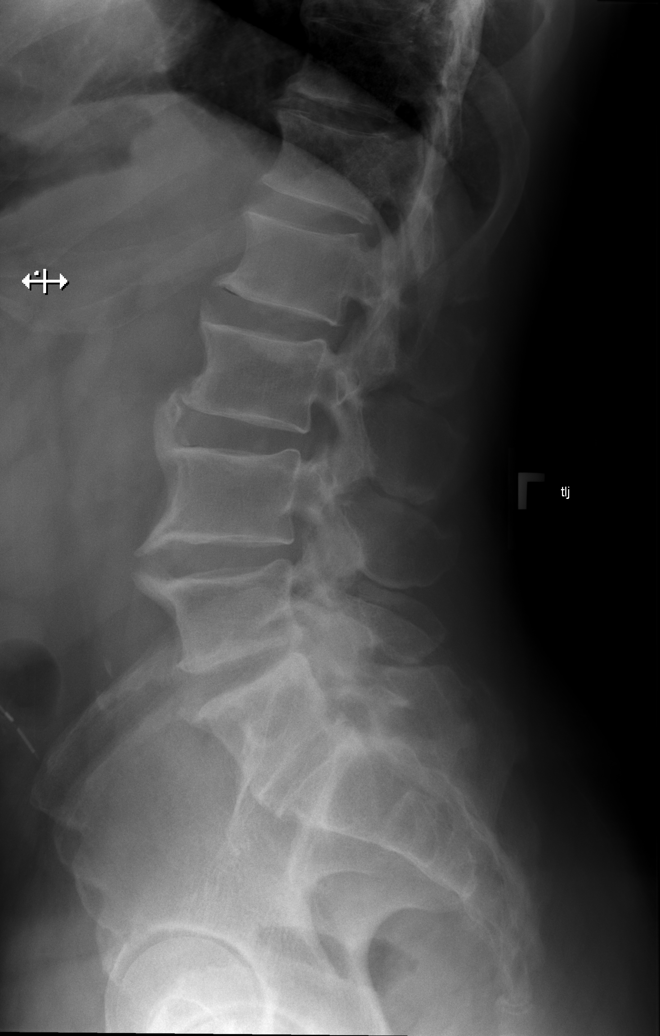

[w lumbar l-5 s-1 spot]
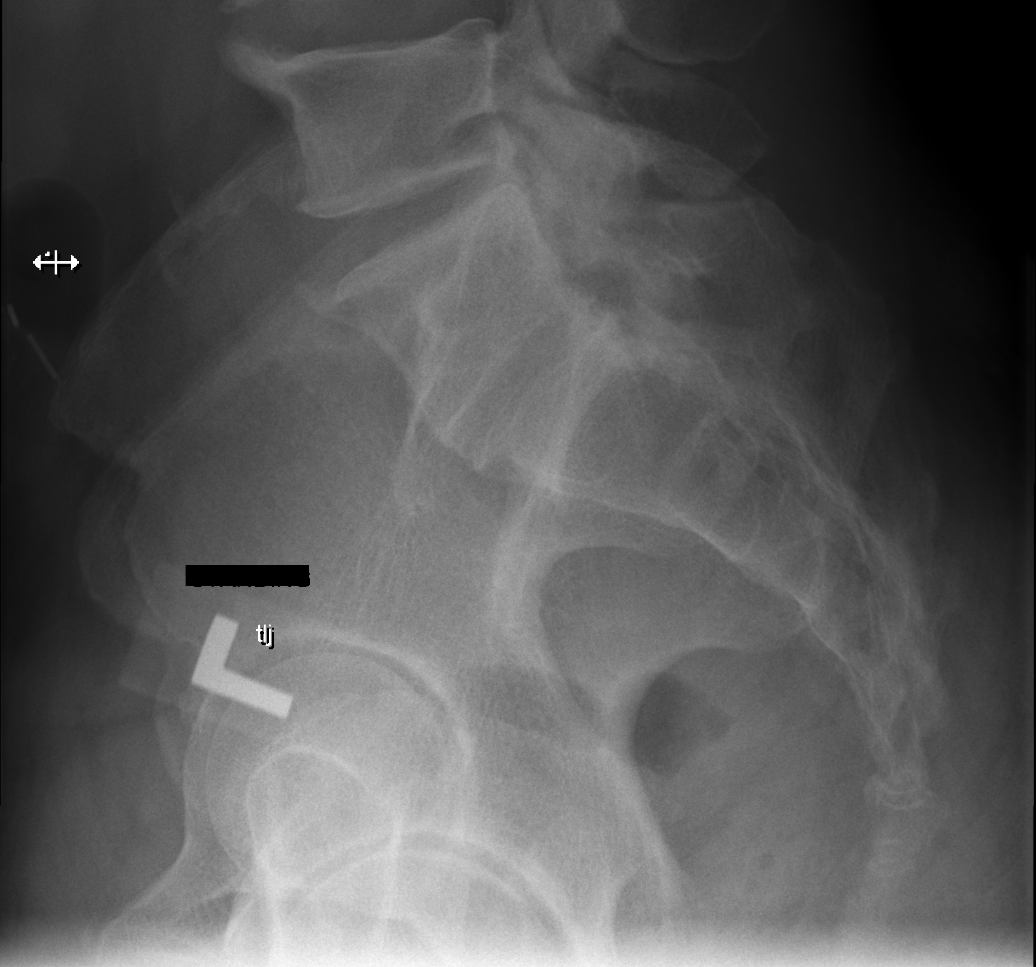

[5 of 5 positions shown; findings below may reference images not displayed]

FINDINGS: Five lumbar type vertebra. Mild broad-based levo scoliotic
curvature, unchanged from prior exam. No listhesis. Anterior
endplate spurring most prominently affecting L2-L3 and L3-L4.
Minimal disc space narrowing at L4-L5. Mild to moderate lower lumbar
facet hypertrophy. Vertebral body heights are normal. No evidence of
fracture or focal bone lesion. Upper aspect of the sacroiliac joints
are fused as seen on prior exam.
IMPRESSION: Stable appearance of mild scoliosis and degenerative change in the
lumbar spine from prior exam. No acute radiographic findings.

## 2023-10-11 ENCOUNTER — Encounter (HOSPITAL_BASED_OUTPATIENT_CLINIC_OR_DEPARTMENT_OTHER): Payer: Self-pay

## 2023-10-13 ENCOUNTER — Ambulatory Visit (HOSPITAL_BASED_OUTPATIENT_CLINIC_OR_DEPARTMENT_OTHER): Admitting: Pulmonary Disease

## 2024-01-04 DIAGNOSIS — Z0000 Encounter for general adult medical examination without abnormal findings: Secondary | ICD-10-CM | POA: Diagnosis not present

## 2024-01-04 DIAGNOSIS — M519 Unspecified thoracic, thoracolumbar and lumbosacral intervertebral disc disorder: Secondary | ICD-10-CM | POA: Diagnosis not present

## 2024-01-04 DIAGNOSIS — Z125 Encounter for screening for malignant neoplasm of prostate: Secondary | ICD-10-CM | POA: Diagnosis not present

## 2024-01-04 DIAGNOSIS — I10 Essential (primary) hypertension: Secondary | ICD-10-CM | POA: Diagnosis not present

## 2024-01-04 DIAGNOSIS — Z7189 Other specified counseling: Secondary | ICD-10-CM | POA: Diagnosis not present

## 2024-01-04 DIAGNOSIS — F17200 Nicotine dependence, unspecified, uncomplicated: Secondary | ICD-10-CM | POA: Diagnosis not present

## 2024-01-04 DIAGNOSIS — E785 Hyperlipidemia, unspecified: Secondary | ICD-10-CM | POA: Diagnosis not present

## 2024-01-04 DIAGNOSIS — Z1331 Encounter for screening for depression: Secondary | ICD-10-CM | POA: Diagnosis not present

## 2024-01-04 DIAGNOSIS — E1169 Type 2 diabetes mellitus with other specified complication: Secondary | ICD-10-CM | POA: Diagnosis not present

## 2024-01-04 DIAGNOSIS — D869 Sarcoidosis, unspecified: Secondary | ICD-10-CM | POA: Diagnosis not present

## 2024-01-11 DIAGNOSIS — E7849 Other hyperlipidemia: Secondary | ICD-10-CM | POA: Diagnosis not present

## 2024-01-11 DIAGNOSIS — Z125 Encounter for screening for malignant neoplasm of prostate: Secondary | ICD-10-CM | POA: Diagnosis not present

## 2024-01-11 DIAGNOSIS — E663 Overweight: Secondary | ICD-10-CM | POA: Diagnosis not present

## 2024-01-11 DIAGNOSIS — I10 Essential (primary) hypertension: Secondary | ICD-10-CM | POA: Diagnosis not present

## 2024-01-11 DIAGNOSIS — E1169 Type 2 diabetes mellitus with other specified complication: Secondary | ICD-10-CM | POA: Diagnosis not present

## 2024-01-11 DIAGNOSIS — F3341 Major depressive disorder, recurrent, in partial remission: Secondary | ICD-10-CM | POA: Diagnosis not present

## 2024-01-11 DIAGNOSIS — Z6828 Body mass index (BMI) 28.0-28.9, adult: Secondary | ICD-10-CM | POA: Diagnosis not present

## 2024-01-11 DIAGNOSIS — Z008 Encounter for other general examination: Secondary | ICD-10-CM | POA: Diagnosis not present

## 2024-01-11 DIAGNOSIS — D869 Sarcoidosis, unspecified: Secondary | ICD-10-CM | POA: Diagnosis not present

## 2024-01-11 DIAGNOSIS — F17210 Nicotine dependence, cigarettes, uncomplicated: Secondary | ICD-10-CM | POA: Diagnosis not present

## 2024-01-16 DIAGNOSIS — M542 Cervicalgia: Secondary | ICD-10-CM | POA: Diagnosis not present

## 2024-01-16 DIAGNOSIS — I10 Essential (primary) hypertension: Secondary | ICD-10-CM | POA: Diagnosis not present

## 2024-01-16 DIAGNOSIS — F17200 Nicotine dependence, unspecified, uncomplicated: Secondary | ICD-10-CM | POA: Diagnosis not present

## 2024-04-09 ENCOUNTER — Encounter: Payer: Self-pay | Admitting: Gastroenterology

## 2024-04-25 ENCOUNTER — Telehealth: Payer: Self-pay

## 2024-04-30 ENCOUNTER — Ambulatory Visit

## 2024-04-30 VITALS — Ht 72.0 in | Wt 220.0 lb

## 2024-04-30 DIAGNOSIS — Z1211 Encounter for screening for malignant neoplasm of colon: Secondary | ICD-10-CM

## 2024-04-30 MED ORDER — NA SULFATE-K SULFATE-MG SULF 17.5-3.13-1.6 GM/177ML PO SOLN: 1.0000 | Freq: Once | ORAL | 0 refills | Status: AC

## 2024-04-30 NOTE — Progress Notes (Signed)

## 2024-05-14 ENCOUNTER — Encounter: Payer: Self-pay | Admitting: Gastroenterology
